# Patient Record
Sex: Female | Born: 1935 | Race: White | Hispanic: No | Smoking: Former smoker
Health system: Southern US, Community
[De-identification: ages and names within clinical notes are randomized; demographics above are authoritative.]

## PROBLEM LIST (undated history)

## (undated) DIAGNOSIS — I10 Essential (primary) hypertension: Secondary | ICD-10-CM

## (undated) DIAGNOSIS — K589 Irritable bowel syndrome without diarrhea: Secondary | ICD-10-CM

## (undated) DIAGNOSIS — G20 Parkinson's disease: Secondary | ICD-10-CM

## (undated) DIAGNOSIS — L719 Rosacea, unspecified: Secondary | ICD-10-CM

## (undated) DIAGNOSIS — R011 Cardiac murmur, unspecified: Secondary | ICD-10-CM

## (undated) DIAGNOSIS — E785 Hyperlipidemia, unspecified: Secondary | ICD-10-CM

## (undated) DIAGNOSIS — R1310 Dysphagia, unspecified: Secondary | ICD-10-CM

## (undated) DIAGNOSIS — E041 Nontoxic single thyroid nodule: Secondary | ICD-10-CM

## (undated) DIAGNOSIS — G20A1 Parkinson's disease without dyskinesia, without mention of fluctuations: Secondary | ICD-10-CM

## (undated) DIAGNOSIS — M1990 Unspecified osteoarthritis, unspecified site: Secondary | ICD-10-CM

## (undated) DIAGNOSIS — G250 Essential tremor: Secondary | ICD-10-CM

## (undated) DIAGNOSIS — R269 Unspecified abnormalities of gait and mobility: Secondary | ICD-10-CM

## (undated) DIAGNOSIS — R413 Other amnesia: Secondary | ICD-10-CM

## (undated) DIAGNOSIS — R443 Hallucinations, unspecified: Secondary | ICD-10-CM

## (undated) HISTORY — DX: Rosacea, unspecified: L71.9

## (undated) HISTORY — DX: Hyperlipidemia, unspecified: E78.5

## (undated) HISTORY — DX: Other amnesia: R41.3

## (undated) HISTORY — DX: Irritable bowel syndrome, unspecified: K58.9

## (undated) HISTORY — PX: APPENDECTOMY: SHX54

## (undated) HISTORY — PX: TONSILLECTOMY: SUR1361

## (undated) HISTORY — PX: CHOLECYSTECTOMY: SHX55

## (undated) HISTORY — DX: Essential tremor: G25.0

## (undated) HISTORY — DX: Unspecified abnormalities of gait and mobility: R26.9

---
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
## 2001-03-22 ENCOUNTER — Encounter: Payer: Self-pay | Admitting: Family Medicine

## 2001-03-22 ENCOUNTER — Ambulatory Visit (HOSPITAL_COMMUNITY): Admission: RE | Admit: 2001-03-22 | Discharge: 2001-03-22 | Payer: Self-pay | Admitting: Family Medicine

## 2001-12-10 ENCOUNTER — Encounter: Payer: Self-pay | Admitting: Family Medicine

## 2001-12-10 ENCOUNTER — Ambulatory Visit (HOSPITAL_COMMUNITY): Admission: RE | Admit: 2001-12-10 | Discharge: 2001-12-10 | Payer: Self-pay | Admitting: Family Medicine

## 2001-12-27 ENCOUNTER — Emergency Department (HOSPITAL_COMMUNITY): Admission: RE | Admit: 2001-12-27 | Discharge: 2001-12-27 | Payer: Self-pay | Admitting: Internal Medicine

## 2002-05-08 ENCOUNTER — Encounter: Payer: Self-pay | Admitting: Family Medicine

## 2002-05-08 ENCOUNTER — Ambulatory Visit (HOSPITAL_COMMUNITY): Admission: RE | Admit: 2002-05-08 | Discharge: 2002-05-08 | Payer: Self-pay | Admitting: Family Medicine

## 2002-07-02 ENCOUNTER — Ambulatory Visit (HOSPITAL_COMMUNITY): Admission: RE | Admit: 2002-07-02 | Discharge: 2002-07-02 | Payer: Self-pay | Admitting: Family Medicine

## 2002-07-02 ENCOUNTER — Encounter: Payer: Self-pay | Admitting: Family Medicine

## 2003-01-22 ENCOUNTER — Encounter: Payer: Self-pay | Admitting: General Surgery

## 2003-01-22 ENCOUNTER — Encounter: Payer: Self-pay | Admitting: Family Medicine

## 2003-01-22 ENCOUNTER — Ambulatory Visit (HOSPITAL_COMMUNITY): Admission: RE | Admit: 2003-01-22 | Discharge: 2003-01-22 | Payer: Self-pay | Admitting: Family Medicine

## 2003-01-22 ENCOUNTER — Inpatient Hospital Stay (HOSPITAL_COMMUNITY): Admission: AD | Admit: 2003-01-22 | Discharge: 2003-01-24 | Payer: Self-pay | Admitting: Family Medicine

## 2003-01-30 ENCOUNTER — Encounter: Payer: Self-pay | Admitting: Family Medicine

## 2003-01-30 ENCOUNTER — Ambulatory Visit (HOSPITAL_COMMUNITY): Admission: RE | Admit: 2003-01-30 | Discharge: 2003-01-30 | Payer: Self-pay | Admitting: Family Medicine

## 2003-05-20 ENCOUNTER — Encounter: Payer: Self-pay | Admitting: Family Medicine

## 2003-05-20 ENCOUNTER — Ambulatory Visit (HOSPITAL_COMMUNITY): Admission: RE | Admit: 2003-05-20 | Discharge: 2003-05-20 | Payer: Self-pay | Admitting: Family Medicine

## 2005-02-04 ENCOUNTER — Ambulatory Visit (HOSPITAL_COMMUNITY): Admission: RE | Admit: 2005-02-04 | Discharge: 2005-02-04 | Payer: Self-pay | Admitting: Family Medicine

## 2005-11-22 ENCOUNTER — Ambulatory Visit (HOSPITAL_COMMUNITY): Admission: RE | Admit: 2005-11-22 | Discharge: 2005-11-22 | Payer: Self-pay | Admitting: Internal Medicine

## 2005-11-22 ENCOUNTER — Ambulatory Visit: Payer: Self-pay | Admitting: Internal Medicine

## 2005-11-30 ENCOUNTER — Ambulatory Visit: Payer: Self-pay | Admitting: Internal Medicine

## 2005-11-30 ENCOUNTER — Ambulatory Visit (HOSPITAL_COMMUNITY): Admission: RE | Admit: 2005-11-30 | Discharge: 2005-11-30 | Payer: Self-pay | Admitting: Internal Medicine

## 2006-07-18 ENCOUNTER — Ambulatory Visit (HOSPITAL_COMMUNITY): Admission: RE | Admit: 2006-07-18 | Discharge: 2006-07-18 | Payer: Self-pay | Admitting: Family Medicine

## 2006-11-22 ENCOUNTER — Ambulatory Visit (HOSPITAL_COMMUNITY): Admission: RE | Admit: 2006-11-22 | Discharge: 2006-11-22 | Payer: Self-pay | Admitting: Family Medicine

## 2007-02-13 ENCOUNTER — Ambulatory Visit (HOSPITAL_COMMUNITY): Admission: RE | Admit: 2007-02-13 | Discharge: 2007-02-13 | Payer: Self-pay | Admitting: Family Medicine

## 2007-02-28 ENCOUNTER — Ambulatory Visit (HOSPITAL_COMMUNITY): Admission: RE | Admit: 2007-02-28 | Discharge: 2007-02-28 | Payer: Self-pay | Admitting: Family Medicine

## 2008-01-30 ENCOUNTER — Ambulatory Visit (HOSPITAL_COMMUNITY): Admission: RE | Admit: 2008-01-30 | Discharge: 2008-01-30 | Payer: Self-pay | Admitting: Family Medicine

## 2008-02-28 ENCOUNTER — Ambulatory Visit: Payer: Self-pay | Admitting: Cardiology

## 2008-03-03 ENCOUNTER — Ambulatory Visit (HOSPITAL_COMMUNITY): Admission: RE | Admit: 2008-03-03 | Discharge: 2008-03-03 | Payer: Self-pay | Admitting: Cardiology

## 2008-03-23 ENCOUNTER — Ambulatory Visit: Payer: Self-pay | Admitting: Cardiology

## 2008-03-25 ENCOUNTER — Ambulatory Visit: Payer: Self-pay | Admitting: Cardiology

## 2008-04-02 ENCOUNTER — Ambulatory Visit: Payer: Self-pay | Admitting: Cardiology

## 2008-04-08 ENCOUNTER — Ambulatory Visit (HOSPITAL_COMMUNITY): Admission: RE | Admit: 2008-04-08 | Discharge: 2008-04-08 | Payer: Self-pay | Admitting: Family Medicine

## 2008-04-09 ENCOUNTER — Ambulatory Visit (HOSPITAL_COMMUNITY): Admission: RE | Admit: 2008-04-09 | Discharge: 2008-04-09 | Payer: Self-pay | Admitting: Family Medicine

## 2008-04-28 ENCOUNTER — Ambulatory Visit: Payer: Self-pay | Admitting: Cardiology

## 2009-05-19 ENCOUNTER — Ambulatory Visit (HOSPITAL_COMMUNITY): Admission: RE | Admit: 2009-05-19 | Discharge: 2009-05-19 | Payer: Self-pay | Admitting: Family Medicine

## 2009-06-09 ENCOUNTER — Encounter: Payer: Self-pay | Admitting: Adult Health

## 2009-06-09 ENCOUNTER — Encounter (INDEPENDENT_AMBULATORY_CARE_PROVIDER_SITE_OTHER): Payer: Self-pay | Admitting: *Deleted

## 2009-06-09 ENCOUNTER — Ambulatory Visit: Payer: Self-pay | Admitting: Cardiology

## 2009-06-09 DIAGNOSIS — K219 Gastro-esophageal reflux disease without esophagitis: Secondary | ICD-10-CM

## 2009-06-09 DIAGNOSIS — I10 Essential (primary) hypertension: Secondary | ICD-10-CM

## 2009-06-09 DIAGNOSIS — R55 Syncope and collapse: Secondary | ICD-10-CM

## 2009-06-09 DIAGNOSIS — R259 Unspecified abnormal involuntary movements: Secondary | ICD-10-CM | POA: Insufficient documentation

## 2009-06-09 DIAGNOSIS — J45909 Unspecified asthma, uncomplicated: Secondary | ICD-10-CM

## 2009-06-09 LAB — CONVERTED CEMR LAB
ALT: 18 units/L
AST: 14 units/L
Alkaline Phosphatase: 82 units/L
Bilirubin, Direct: 0.2 mg/dL
Cholesterol: 180 mg/dL
HDL: 60 mg/dL
LDL Cholesterol: 86 mg/dL
Potassium: 4.4 meq/L
Sodium: 142 meq/L
Total Protein: 6.6 g/dL
Triglycerides: 168 mg/dL

## 2010-05-25 ENCOUNTER — Ambulatory Visit (HOSPITAL_COMMUNITY): Admission: RE | Admit: 2010-05-25 | Discharge: 2010-05-25 | Payer: Self-pay | Admitting: Family Medicine

## 2010-06-09 ENCOUNTER — Ambulatory Visit (HOSPITAL_COMMUNITY): Admission: RE | Admit: 2010-06-09 | Discharge: 2010-06-09 | Payer: Self-pay | Admitting: Family Medicine

## 2010-06-16 ENCOUNTER — Encounter: Admission: RE | Admit: 2010-06-16 | Discharge: 2010-06-16 | Payer: Self-pay | Admitting: Family Medicine

## 2010-07-06 ENCOUNTER — Ambulatory Visit (HOSPITAL_COMMUNITY): Admission: RE | Admit: 2010-07-06 | Discharge: 2010-07-06 | Payer: Self-pay | Admitting: Family Medicine

## 2010-07-13 ENCOUNTER — Ambulatory Visit (HOSPITAL_COMMUNITY)
Admission: RE | Admit: 2010-07-13 | Discharge: 2010-07-13 | Payer: Self-pay | Source: Home / Self Care | Admitting: Family Medicine

## 2010-08-29 ENCOUNTER — Encounter: Payer: Self-pay | Admitting: Family Medicine

## 2010-12-21 NOTE — Letter (Signed)
April 02, 2008    Lilyan Punt, MD  [address]  [place], [place]  [address]   RE:  Heather Gilmore, Heather Gilmore  MRN:  [id number]  /  DOB:  Mar 28, 1930   Dear Lorin Picket:   Ms. Fromer returns to the office for continued assessment and treatment of  lightheadedness and 1 syncopal spell.  She has felt better since her  last office visit.  She attributes this to 1 week when she omitted her  enalapril while she was traveling, because she forgot to take it with  her.  She did carry and event recorder for 3 weeks.  She is on to  multiple strips describing her symptoms as dyspnea and palpitations.  She did have reasonable correlation with her rhythm, which was typically  sinus bradycardia or junctional bradycardia; however, the relationship  was not perfect.  At times, she had very slow heart rate without  symptoms.  At other times, she reported symptoms without much in the way  of arrhythmia.   IMPRESSION:  Ms. Streng is doing fairly well over all.  It appears that  her symptoms may be related to relative bradycardia associated with a  relatively low blood pressure.  This would account for improvement with  discontinuation of enalapril which did not affect her heart rhythm.  We  will try to omit verapamil and continue enalapril.  She will monitor  blood pressures at home.  She will call to report recurrent syncope if  that occurs.  Otherwise, I will re-assess this nice woman in 1 month and  determine whether or not implantation of a pacemaker is warranted.   Her laboratory studies were generally benign except for the carotid  ultrasound, which reveled no carotid disease but did identify a cystic  structure in the thyroid.  As per my previous note, I would appreciate  your managing that issue.    Sincerely,      [name], MD, [facility]  Electronically Signed    RMR/MedQ  DD: 04/02/2008  DT: 04/03/2008  Job #: [id number]

## 2010-12-21 NOTE — Letter (Signed)
April 28, 2008    Scott A. Gerda Diss, MD  [address]  [place], [place] [address]   RE:  SHANTRELL, PLACZEK  MRN:  [id number]  /  DOB:  30-Jun-1930   Dear Lorin Picket:   Ms. Lun returns to the office for continued assessment treatment of  lightheadedness with one episode of syncope and substantial sinus  bradycardia.  Since my last adjustment of medication, she has felt just  fine.  She notes no further neurologic symptoms.  She has a good level  of energy, and denies all cardiopulmonary symptoms.  She has had further  evaluation of her thyroid.  A scan showed multiple cysts, some of them  complex.  A biopsy is apparently planned.   CURRENT MEDICATIONS:  1. Enalapril 10 mg daily.  2. Topiramate 50 mg b.i.d.  3. Aspirin 81 mg daily.   PHYSICAL EXAMINATION:  GENERAL:  On exam, pleasant overweight woman in  no acute distress.  VITAL SIGNS:  The weight is 142, 1 pound less than last month.  Blood  pressure 150/80, heart rate 80 and regular, and respirations 14.  NECK:  No jugular venous distention; normal carotid upstrokes without  bruits.  LUNGS:  Clear.  CARDIAC:  Normal first and second heart sounds; fourth heart sound  present.  ABDOMEN:  Soft and nontender; no organomegaly.  EXTREMITIES:  No edema; distal pulses are intact.   Rhythm strip:  Normal sinus rhythm at a rate of 77.   Ms. Klinker brings in a list of 14 blood pressures obtained at home.  Only  one shows significantly elevated systolics of 160.  There are 2-3  systolics close to 150.  The rest are fine.   IMPRESSION:  Ms. Frick symptoms have resolved with discontinuation of  verapamil.  It appears that bradycardia was more problematic than  hypotension.  Sinus bradycardia is not frequently seen with verapamil,  but can sometimes occur.  It would be worthwhile for Ms. Kushnir to avoid  any weight-lowering drugs, most notably diltiazem and beta-blockers.  I  will substitute lisinopril at a dose of 20 mg for enalapril in  hopes of  better controlling her blood pressure.  She does not appear to need  aspirin, she has no known vascular disease.  I will see this nice woman  again in 1 year.    Sincerely,      [name], MD, [facility]  Electronically Signed    RMR/MedQ  DD: 04/28/2008  DT: 04/29/2008  Job #: [id number]

## 2010-12-21 NOTE — Letter (Signed)
February 28, 2008    Scott A. Gerda Diss, MD  [address], [address]  [place], [place] [address]   RE:  Heather, Gilmore  MRN:  [id number]  /  DOB:  23-Mar-1930   Dear Lorin Picket,   Heather Gilmore is seen in the office today in consultation at your request for  syncope.  As you know, Heather Gilmore has enjoyed generally good  health.  She has never been seen by a cardiologist nor undergone any  significant cardiac testing.  She has had hypertension, which has been  well controlled with medical therapy.  She does not used tobacco  products.  She has not had diabetes.  She has no history of vascular  disorders other than DVT.   Over the past 4 weeks or so, she is noted episodic dizziness.  Heather  typically occurs when she arises from the sitting or lying position, but  is not related to change in body position at other times.  Heather Gilmore most  serious episode was upon walking into the bathroom when she suddenly  lost consciousness and fell.  She had a soft tissue injury to Heather Gilmore left  knee, but Heather healed without problems.  She has had subsequent dizzy  spells that have resolved with sitting.  She notes associated  palpitations and dyspnea at times.  She has had a MRI study of Heather Gilmore head  that was negative, showing only small vessel disease.  An MRA was not  performed in conjunction with that study.  She has taken Heather Gilmore blood  pressure after the onset of spells, but has not identified true  hypotension.  Episodes are quite intermittent.  She may go a period of  weeks without symptoms, but then suffer daily symptoms for a period of  time.   Past medical history is mostly notable for GI issues.  She has GERD and  has undergone a number of esophageal dilatations.  She also has a  history of IBS.  She is said to have asthma, but did not require any  significant medication.  She has a longstanding tremor that is treated  by Dr. Thad Ranger of [facility].  She is unaware of his diagnosis.   She had a significant  pneumonia in 2002.  She underwent appendectomy  many years ago.   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  Walks on a daily basis.   FAMILY HISTORY:  Positive for neoplastic disease, but not vascular  disease.   Review of systems is notable for intermittent headaches, she need for  corrective lenses, some impairment in hearing, intermittent dyspnea,  palpitations, Heather Gilmore last colonoscopy was in 2007.  She has some  intermittent pain in Heather Gilmore left leg and foot with edema.  She has rosacea.  All other systems reviewed and are negative.   On exam,  GENERAL:  Heather Gilmore with erythematous complexion over the face in  no acute distress.  VITAL SIGNS:  The weight is 144.  Blood pressure 150/80 without  orthostatic change.  Nonetheless, the patient reported dizziness when  she stood up.  HEENT:  EOMs full; normal lids and conjunctivae; normal oral mucosa.  NECK:  No jugular venous distention; normal carotid upstrokes without  bruits.  ENDOCRINE:  No thyromegaly.  HEMATOPOIETIC:  No adenopathy.  LUNGS:  Clear.  CARDIAC:  Normal first and second heart sounds; fourth heart sound  present.  ABDOMEN:  Soft and nontender; no organomegaly.  EXTREMITIES:  Trace edema; normal  distal pulses.  NEUROLOGIC:  Symmetric strength in tone; normal reflexes; normal cranial  nerves.   EKG:  Normal sinus rhythm; borderline first-degree AV block; voltage  criteria for LVH; minor nonspecific ST-T wave abnormality; abnormal R-  wave progression - cannot exclude prior septal myocardial infarction.  No prior tracing for comparison.   IMPRESSION:  [name] has some symptoms of sound like vertigo, but  transient loss of consciousness would not be expected with a fairly  vertiginous symptoms.  The orthostatic nature of Heather Gilmore problems raises the  question of orthostatic hypotension; however, she manifests only a  minimal decrease in blood pressure when standing today.  She  could have  a tendency towards orthostatic hypotension with episodic dehydration,  but I think Heather is unlikely.  Arrhythmia is another possible cause of  cerebral hypoperfusion.  We will provide Heather Gilmore with event recorder.  Basic  laboratory testing will be obtained.  There is no history to suggest a  serious or  life-threatening cause of syncope such as pulmonary embolism.  The kind  of symptoms that she manifested in the office may reflect more in the  way of vertigo than cerebral hypoperfusion.  I am not inclined to  investigate that possibility until we have excluded significant causes  of syncope.  I will reassess Heather Gilmore in 1 month.    Sincerely,      [name], MD, [facility]  Electronically Signed    RMR/MedQ  DD: 02/28/2008  DT: 02/29/2008  Job #: [id number]

## 2010-12-24 NOTE — Procedures (Signed)
   NAME:  MEKIA, DIPINTO NO.:  [id number]   MEDICAL RECORD NO.:  [id number]                   PATIENT TYPE:  OUT   LOCATION:  RAD                                  FACILITY:  [facility]   PHYSICIAN:  Donna Bernard, M.D.             DATE OF BIRTH:  [birth year]   DATE OF PROCEDURE:  01/22/2003  DATE OF DISCHARGE:  01/22/2003                                EKG INTERPRETATION   INTERPRETATION:  EKG reveals normal sinus rhythm with no significant ST-T  changes.  PR interval is 0.22 so therefore first degree A-V block.      ___________________________________________                                            Donna Bernard, M.D.   WSL/MEDQ  D:  06/10/2003  T:  06/10/2003  Job:  [id number]

## 2010-12-24 NOTE — Op Note (Signed)
NAME:  Heather Gilmore, Heather Gilmore                 ACCOUNT NO.:  [id number]   MEDICAL RECORD NO.:  [id number]          PATIENT TYPE:  AMB   LOCATION:  DAY                           FACILITY:  [facility]   PHYSICIAN:  R. Roetta Sessions, M.D. DATE OF BIRTH:  1930-07-24   DATE OF PROCEDURE:  11/30/2005  DATE OF DISCHARGE:                                 OPERATIVE REPORT   PROCEDURES:  Esophagogastroduodenoscopy with Elease Hashimoto dilation, followed by  diagnostic colonoscopy.   INDICATION FOR PROCEDURE:  The patient is a 77 year old lady with a  Hemoccult-positive stool and chronic constipation.  Had a distant history of  adenomatous polyps.  One out of three Hemoccult cards came back positive  recently.  She has not had any gross blood per rectum.  She had some severe  left-sided crampy abdominal pain the day before she saw [place] in the office  (November 21, 2005).  She had then a CT scan, which revealed diverticulosis, no  diverticulitis, and a fatty-appearing liver.  She also had esophageal  dysphagia to solids, has had the symptoms for years.  EGD and colonoscopy  are now being done.  This approach has been discussed with the patient at  length, the potential risks, benefits, and alternatives have been reviewed,  all questions answered.  She is agreeable.  There has not been any change  since she was seen in the office on November 22, 2005.   PROCEDURE NOTE:  O2 saturation, blood pressure, pulse, and respiration were  monitored throughout the entire procedure.   CONSCIOUS SEDATION:  Versed 5 mg IV, Demerol 125 mg IV in divided doses for  both procedures.   The patient received 1 g of vancomycin IV and gentamicin 100 mg IV for SBE  prophylaxis prior to the procedure.  She had some itching after vancomycin  infusion was started, which responded to a single dose of Benadryl 25 mg IV.   INSTRUMENT USED:  Olympus video chip system.   FINDINGS:  EGD:  Examination of the tubular esophagus revealed a prominent  Schatzki's ring.  The esophageal mucosa otherwise appeared normal.  The EG  junction was easily traversed with the scope.   Stomach:  The gastric cavity was empty and insufflated well with air.  A  thorough examination of the gastric mucosa including retroflexion in the  proximal stomach and esophagogastric junction demonstrated only a small  hiatal hernia.  Pylorus patent and easily traversed.  Examination of the  bulb and second portion revealed no abnormalities.   Therapeutic/diagnostic maneuvers performed:  A 54 French Maloney dilator was  passed to full insertion.  A look back revealed that the ring had been  ruptured without apparent complication.  The patient tolerated the procedure  well and was prepared for colonoscopy.   Digital rectal exam revealed lax anal sphincter tone.   Endoscopic findings:  The prep was adequate.   Rectum:  Examination of the rectal mucosa including retroflexion in the anal  verge and an en face view of the anal canal demonstrated some friable  internal hemorrhoids only.   Colon:  The colonic  mucosa was surveyed from the rectosigmoid junction  through the left, transverse and right colon, to the area of the appendiceal  orifice, ileocecal valve and cecum.  These structures were well-seen and  photographed for the record.  From this level the scope was slowly withdrawn  and all previously-mentioned mucosal surfaces were again seen.  The patient  had extensive transverse and left-sided diverticula.  The remainder of the  colonic mucosa appeared normal.  The patient tolerated both procedures well  and was reacted in endoscopy.   IMPRESSION:  Esophagogastroduodenoscopy:  1.  Prominent Schatzki's ring, otherwise normal esophagus.  2.  Small hiatal hernia, otherwise normal stomach, D1, D2.  3.  Status post dilation as described above.   Colonoscopy findings:  1.  Friable hemorrhoids, otherwise normal rectum.  2.  Extensive left-sided and transverse  diverticula.  The remainder of the      colonic mucosa appeared normal.   RECOMMENDATIONS:  1.  Diverticulosis literature given to Heather Gilmore.  She should take a daily      fiber supplement daily.  2.  She should also continue taking Prilosec 20 mg orally on an every-day      basis.  3.  She should have another surveillance colonoscopy in five years.  Would      not pursue further GI evaluation unless this lady starts having signs      and symptoms of overt GI bleeding.      Jonathon Bellows, M.D.  Electronically Signed     RMR/MEDQ  D:  11/30/2005  T:  12/01/2005  Job:  [id number]   cc:   Donna Bernard, M.D.  Fax: [phone]

## 2010-12-24 NOTE — Procedures (Signed)
NAME:  ELOWEN, DEBRUYN NO.:  [id number]   MEDICAL RECORD NO.:  [id number]                  PATIENT TYPE:   LOCATION:                                       FACILITY:   PHYSICIAN:  Donna Bernard, M.D.             DATE OF BIRTH:   DATE OF PROCEDURE:  01/22/2003  DATE OF DISCHARGE:                                EKG INTERPRETATION   FINDINGS:  1. EKG reveals normal sinus rhythm with nonspecific ST-T changes.  2. The PR interval is 0.22 which is consistent with first-degree AV block.      ___________________________________________                                            Donna Bernard, M.D.   WSL/MEDQ  D:  08/06/2003  T:  08/06/2003  Job:  [id number]

## 2010-12-24 NOTE — Op Note (Signed)
NAME:  Heather Gilmore, Heather Gilmore NO.:  [id number]   MEDICAL RECORD NO.:  [id number]                   PATIENT TYPE:  INP   LOCATION:  A312                                 FACILITY:  [facility]   PHYSICIAN:  Dalia Heading, M.D.               DATE OF BIRTH:  [birth year]   DATE OF PROCEDURE:  DATE OF DISCHARGE:                                 OPERATIVE REPORT   PREOPERATIVE DIAGNOSIS:  Acute cholecystitis, cholelithiasis.   POSTOPERATIVE DIAGNOSIS:  Acute cholecystitis, cholelithiasis.   PROCEDURE:  Laparoscopic cholecystectomy   SURGEON:  Dalia Heading, M.D.   ASSISTANT:  Buena Irish, M.D.   ANESTHESIA:  General endotracheal   INDICATIONS:  The patient is a 77 year old white female who presents with  acute cholecystitis secondary to cholelithiasis.  The risks and benefits of  the procedure including bleeding, infection, hepatobiliary injury, and the  possibility of an open procedure were fully explained to the patient, who  gave informed consent.   DESCRIPTION OF PROCEDURE:  The patient was placed in the supine position.  After induction of general endotracheal anesthesia, the abdomen was prepped  and draped using the usual sterile technique with Betadine.  Surgical site  confirmation was performed.   An supraumbilical incision was made down to the fascia.  A Veress needle was  introduced into the abdominal cavity and confirmation of placement was done  using the saline drop test.  The abdomen was then insufflated to 16 mmHg  pressure.  An 11-mm trocar was introduced into the abdominal cavity under  direct visualization without difficulty.  The patient was placed then in  reverse Trendelenburg position and an additional 11-mm trocar was placed in  the epigastric region and 5-mm trocars were placed in the right upper  quadrant and right flank regions. The liver was inspected and noted to be  within normal limits.   The gallbladder was noted to be  acutely inflamed and distended.  A needle  was advanced into the gallbladder lumen and hydrops of the gallbladder was  found.  The gallbladder was then decompressed.  A stone was noted in the  neck of the infundibulum.  The gallbladder was retracted superiorly and  laterally.  The dissection was begun around the infundibulum of the  gallbladder.  The cystic duct was first identified.  Its junction to the  infundibulum fully identified.  Given the short length of the cystic duct, a  cholangiocatheter could not be performed.  An Endo-GIA was placed across the  cystic duct and fired.  The cystic artery was ligated and divided.  The  gallbladder was then freed away from the gallbladder fossa using Bovie  electrocautery.  The gallbladder was delivered through the epigastric trocar  site using an EndoCatch bag.  The gallbladder fossa was inspected and any  bleeding was controlled using Bovie electrocautery.  Surgicel was then  placed in  the gallbladder fossa.  The subhepatic space, as well as the right  hepatic gutter were irrigated with normal saline.  All fluid and air were  then evacuated from the abdominal cavity prior to removal of the trocars.   All wounds were irrigated with normal saline.  All wounds were injected with  0.5% Sensorcaine.  The supraumbilical fascia as well as epigastric fascia  were reapproximated using an #0 Vicryl interrupted suture. All skin  incisions were closed using staples.  Betadine ointment and dry sterile  dressings were applied.   All tape and needle counts correct at the end of the procedure.  The patient  was extubated in the operating room and went back to recovery room in awake  and stable condition.   COMPLICATIONS:  None.   SPECIMENS:  Gallbladder with stones.   BLOOD LOSS:  50 cc                                               Dalia Heading, M.D.    MAJ/MEDQ  D:  01/23/2003  T:  01/23/2003  Job:  [id number]   cc:   Donna Bernard, M.D.  [address]  [place]  [place] [address]  Fax: [phone]

## 2010-12-24 NOTE — Letter (Signed)
March 06, 2008    Scott A. Gerda Diss, MD  [address]  [place], [place] [address]   RE:  CYNDEL, GRIFFEY  MRN:  [id number]  /  DOB:  1930-05-08   Dear Lorin Picket,   In conjunction with Ms. Suniga cardiac evaluation, an ultrasound study  of her carotids was obtained.  No significant atherosclerotic disease  was present; however, a complex cystic structure in the left thyroid was  noted.  The radiologist suggested additional evaluation.  If you would  be so kind, I would appreciate you pursuing this incidental finding.   Thank you so much for your help.    Sincerely,      [name], MD, [facility]  Electronically Signed    RMR/MedQ  DD: 03/06/2008  DT: 03/06/2008  Job #: [id number]

## 2010-12-24 NOTE — Discharge Summary (Signed)
   NAME:  Heather Gilmore, BUCKER NO.:  [id number]   MEDICAL RECORD NO.:  [id number]                   PATIENT TYPE:  INP   LOCATION:  A312                                 FACILITY:  [facility]   PHYSICIAN:  Dalia Heading, M.D.               DATE OF BIRTH:  March 18, 1930   DATE OF ADMISSION:  01/22/2003  DATE OF DISCHARGE:  01/24/2003                                 DISCHARGE SUMMARY   HOSPITAL COURSE SUMMARY:  The patient is a 77 year old white female who  presented to Dr. Fletcher Anon office with right upper quadrant abdominal pain  and nausea.  An ultrasound of the gallbladder was performed which revealed  cholelithiasis, a thickened gallbladder wall, and a slightly dilated common  bile duct.  No choledocholithiasis was seen.  The patient was admitted to  the hospital.  A surgery consultation was obtained and the patient was taken  to the operating room on January 23, 2003 and underwent a laparoscopic  cholecystectomy.  Due to significant inflammation of the gallbladder, a  cholangiogram could not be performed.  Her liver enzyme tests were within  normal limits preoperatively.  The patient's postoperative course was  unremarkable.  Her diet was advanced without difficulty.   The following day her hematocrit and liver enzymes tests were within normal  limits.  The patient is being discharged home on postoperative day #1 in  good and improving condition.   DISCHARGE INSTRUCTIONS:  The patient is to follow up with Dr. Franky Macho  on January 30, 2003.   DISCHARGE MEDICATIONS:  1. Darvocet-N 100 one to two tablets p.o. q.4h. p.r.n. pain.  2. Ciprofloxacin 500 mg p.o. b.i.d.  3. She is to resume her other medications as previously prescribed.   PRINCIPAL DIAGNOSES:  1. Acute cholecystitis, cholelithiasis.  2. Hypertension.  3. History of deep vein thrombosis.  4.     Osteoporosis.  5. Asthma.   PRINCIPAL PROCEDURE:  Laparoscopic cholecystectomy on January 23, 2003.                                               Dalia Heading, M.D.    MAJ/MEDQ  D:  01/24/2003  T:  01/24/2003  Job:  [id number]   cc:   Donna Bernard, M.D.  [address]  [place]  [place] [address]  Fax: [phone]

## 2010-12-24 NOTE — H&P (Signed)
NAME:  Heather Gilmore, Heather Gilmore NO.:  [id number]   MEDICAL RECORD NO.:  [id number]                   PATIENT TYPE:  INP   LOCATION:  A312                                 FACILITY:  [facility]   PHYSICIAN:  Donna Bernard, M.D.             DATE OF BIRTH:  1929/09/01   DATE OF ADMISSION:  01/22/2003  DATE OF DISCHARGE:                                HISTORY & PHYSICAL   CHIEF COMPLAINT:  Abdominal pain.   Patient present to the office with a two day history of abdominal pain.  Last night she ate some greasy food and soon after that started developing  right upper quadrant pain throughout the night this radiated to the back.  It was accompanied by significant nausea.  The patient has noted perhaps low-  grade fever.  At times the pain has been quite severe.  Patient noted no  change in her bowel habits, no change in urinary habits.  Her appetite has  been pretty much absent today.   MEDICATIONS:  1. Patient claims compliance with her medications which include aspirin 81     mg daily.  2. Cozaar 50 mg daily.  3. Verapamil 180 SR daily.  4. Calcium/vitamin D, two tablets daily.  5. Ventolin two sprays q.4h. p.r.n.  6. Nexium 40 mg daily.   PRIOR MEDICAL HISTORY:  1. Significant for hyperlipidemia.  2. Hypertension.  3. Esophagitis.  4. Osteoporosis.  5. Asthma.  6. History of deep vein thrombosis.   PRIOR SURGERIES:  1. Remote appendectomy.  2. Remote esophageal dilatation.   FAMILY HISTORY:  Positive for breast cancer, colon cancer, hypertension,  type 2 diabetes, coronary artery disease, hyperlipidemia.   ALLERGIES:  1. AMPICILLIN.  2. DYAZIDE.  3. Patient does have an ASPIRIN sensitivity history but not a true allergy.   REVIEW OF SYSTEMS:  Otherwise negative.   PHYSICAL EXAMINATION:  VITAL SIGNS:  BP 134/82, afebrile, weight 152.  GENERAL:  Patient is alert, in mild discomfort.  HEENT:  Normal.  Pharynx moist.  NECK:  Supple.  No  lymphadenopathy.  LUNGS:  Rare wheeze in the left base.  No crackles.  No tachypnea.  HEART:  Regular, rate and rhythm.  ABDOMEN:  Distinct right upper quadrant tenderness.  No rebound.  Mild  guarding.  No CVA tenderness.  EXTREMITIES:  No edema.  Good range of motion.   SIGNIFICANT BLOOD WORK:  White blood count up at 11,000.  Liver enzymes  normal.   Right upper quadrant ultrasound reveals inflamed gallbladder wall with  multiple stones both in the gallbladder and extending down into near the  duct with an 8 mm common bile duct.   IMPRESSION:  1. Acute cholecystitis.  Discussed at length with the patient and her     family, I think that she needs to have her gallbladder out and that this     is not an  operation without some risk.  The family is advised of such.  2. History of deep vein thrombosis.  With this prior history would like to     be aggressive and administer Lovenox 40 mg subcu q.24h. with the first     dose two hours prior to surgery as per protocol.  3. Asthma.  Stable at this time but we will treat prophylactically to reduce     risk of perioperative reactive airway.  4. Hypertension good control.  5. Hyperlipidemia.   PLAN:  I have spoken with Dr. Lovell Sheehan.  He will see her in consultation this  evening.  I spoke with the family, evaluated the patient.  Further orders as  noted in the chart.                                                Donna Bernard, M.D.    WSL/MEDQ  D:  01/22/2003  T:  01/22/2003  Job:  [id number]

## 2010-12-24 NOTE — Consult Note (Signed)
NAME:  Heather Gilmore, Heather Gilmore                 ACCOUNT NO.:  [id number]   MEDICAL RECORD NO.:  [id number]         PATIENT TYPE:  AMB   LOCATION:                                FACILITY:  [facility]   PHYSICIAN:  R. Roetta Sessions, M.D. DATE OF BIRTH:  [birth year]   DATE OF CONSULTATION:  DATE OF DISCHARGE:                                   CONSULTATION   PRIMARY CARE PHYSICIAN:  Donna Bernard, M.D.   REASON FOR CONSULTATION:  Severe abdominal pain, nausea, vomiting, Hemoccult  positive stool.   HISTORY OF PRESENT ILLNESS:  Heather Gilmore is a 77 year old Caucasian female who  through annual Hemoccult cards were found to have 1/3 Hemoccult positive  stool via recent physical exam.  She has a personal history of adenomatous  polyp back in 1998.  Her last colonoscopy was Dec 27, 2001 by Dr. Jena Gauss, and  she was found to have left-sided transverse diverticula.  She reports that  three days ago she took two Dulcolax, as she has a history of chronic  constipation.  She began to experience severe low abdominal pain.  The  following morning, she developed nausea and vomiting with at least three  episodes.  She continues to have cramp-like bilateral lower quadrant pain,  left side greater than right.  She denies any fevers or chills.  She has a  history of chronic constipation with a bowel movement every 3-4 days.  She  complains of hard, pellet-like stools.  She reports the pain in her low  abdominal pain as an 8/10, currently on a pain scale.   She has a history of chronic GERD, heartburn, indigestion.  She takes  Prilosec on an almost daily basis.  Occasionally when she forgets, she will  take over-the-counter Tums at bedtime.  This does help some.  She is  complaining of recurrent intermittent solid food dysphagia.  She feels like  food gets hung in her mid esophagus.  Occasionally, she has regurgitation as  well.  Denies any odynophagia.  Denies any early satiety or anorexia.   PAST MEDICAL HISTORY:   History of adenomatous polyp and diverticulosis, as  described in the HPI with last colonoscopy in May, 2003.  She has a history  of EGD on September 02, 1999 by Dr. Karilyn Cota, where she was found to have a  tight distal esophageal ring, which was initially dilated, passing the  scope, and subsequently with a gradual balloon up to 18 mm, showed a small  sliding hiatal hernia.  Showed mild changes of reflux esophagitis, limited  to the EG junction.  She has a history of hypertension and rheumatic fever  as a child.  Chronic constipation.  Asthma.  Hypercholesterolemia.  Left  lower extremity DVT.  Appendectomy.  Cholecystectomy 3-4 years ago by Dr.  Lovell Sheehan secondary to cholelithiasis.  Tonsillectomy.   CURRENT MEDICATIONS:  1.  Verapamil 180 mg daily.  2.  Aspirin 81 mg daily.  3.  Calcium and vitamin D 1 tablet daily.  4.  Vasotec 10 mg daily.  5.  Prilosec 20 mg daily.  6.  Albuterol inhaler p.r.n.  7.  Tums p.r.n.   ALLERGIES:  PENICILLIN.   FAMILY HISTORY:  Positive for a mother deceased with nonalcoholic cirrhosis  at age 83.  Father deceased at age 61 secondary to lung carcinoma.   SOCIAL HISTORY:  Heather Gilmore has been married for 39 years.  She has two grown  healthy children.  She is a retired Publishing rights manager.  She has a 20-pack-year  history of tobacco use.  Denies any alcohol or drug use.   REVIEW OF SYSTEMS:  CONSTITUTIONAL:  Weight is stable.  Denies any fevers or  chills.  See HPI.  CARDIOVASCULAR:  Denies any chest pain or palpitations.  PULMONARY:  Denies any shortness of breath, dyspnea, cough, hemoptysis.  See  HPI.  GU:  Denies any dysuria, hematuria, increased urinary frequency.   PHYSICAL EXAMINATION:  VITAL SIGNS:  Weight 154.5 pounds.  Height 61 inches.  Temp 98.1, blood pressure 122/60, pulse 78.  GENERAL:  Heather Gilmore is a 77 year old Caucasian female who is oriented,  pleasant, cooperative.  No acute distress.  HEENT:  Sclerae are clear.  Nonicteric.  Conjunctivae are  pink.  Oropharynx  is pink and moist without any lesions.  NECK:  Supple without lymphadenopathy or thyromegaly.  HEART:  Regular rate with a 1/6 murmur noted.  LUNGS:  Clear to auscultation bilaterally.  ABDOMEN:  Positive bowel sounds x4.  No bruits auscultated.  Soft.  Slightly  distended. She does have significant tenderness to the right lower quadrant  suprapubic area and severe tenderness to the left lower quadrant on deep  palpation.  There is no rebound tenderness or guarding.  No  hepatosplenomegaly or mass.  The exam is limited, given the patient's  tenderness.  EXTREMITIES:  Without clubbing or edema bilaterally.  SKIN:  Pink, warm and dry without any rash or jaundice.   IMPRESSION:  Heather Gilmore is a 77 year old Caucasian female with a history of  chronic constipation and history of adenomatous polyps, who was found to  have a 1/3 positive Hemoccult through recent testing through her primary  care physician's office.  Approximately three days ago, she developed severe  cramp-like abdominal pain, left lower quadrant greater than right, along  with nausea and vomiting.  The pain developed after taking Dulcolax.  The  pain has persisted.  She is quite tender on exam today.  She has a history  of diverticulosis.  Feel certain we will rule out diverticulosis in this  setting.  After this is done, we will consider a repeat colonoscopy, given  her history of adenomatous polyps and Hemoccult positive stool.  We will be  discussing this case further with Dr. Jena Gauss.   She also has recurrent intermittent solid food dysphagia with history of  chronic GERD and distal esophageal ring.  I suspect she may have recurrence  of her ring or developed web or stricture and may need repeat dilatation.   PLAN:  1.  CT of the abdomen and pelvis __________ as soon as possible.  2.  I have offered her pain medication.  She has declined. 3.  I have asked her to take once daily Prilosec 20 mg.  4.   Pending review of CT, we will consider colonoscopy, EGD with ED.  After      discussing with Dr. Jena Gauss, she is going to need SBE prophylaxis and to      be off her aspirin for at least three days prior to the procedure.   We would  like to thank Dr. Lubertha South for allowing [place] to participate in  the care of Heather Gilmore.      Nicholas Lose, N.P.      Jonathon Bellows, M.D.  Electronically Signed    KC/MEDQ  D:  11/22/2005  T:  11/22/2005  Job:  [id number]   cc:   Donna Bernard, M.D.  Fax: [phone]

## 2010-12-24 NOTE — Op Note (Signed)
[facility]  Patient:    Heather Gilmore, Heather Gilmore Visit Number: [id number] MRN: [id number]          Service Type: EMS Location: ED Attending Physician:  Jonathon Bellows Dictated by:   Roetta Sessions, M.D. Proc. Date: 12/27/01 Admit Date:  12/27/2001 Discharge Date: 12/27/2001   CC:         Loran Senters, M.D.   Operative Report  PROCEDURE:  Surveillance colonoscopy.  INDICATIONS FOR PROCEDURE:  The patient is a 77 year old lady who was found to have an adenomatous polyp in her colon which I removed back in 1998. She is here for surveillance. She is devoid of any lower GI tract symptoms. A colonoscopy has been discussed with this patient at length at the bedside. The potential risks, benefits, and alternatives have been reviewed.  It is notable that she gave our staff a history of rheumatic fever as a child and since last being seen by me, she reports it was determined by other health care provides that she should take SB prophylaxis.  It is notable she did not receive SB prophylaxis for her prior colonoscopy nor did she tell us about her history of rheumatic fever.  In the day hospital, she was beginning to receive her 2 gm of ampicillin as part of her SB prophylaxis. She developed some flushing of her face, some chest pain and some right sided facial edema. The infusion was stopped, she was given 25 mg of Benadryl. She then complained of chest pain for which she went to the emergency room and was seen by Dr. Paul Dykes. By the time she was evaluated by Dr. Paul Dykes, the chest pain had resolved. She was given 125 mg of Solu-Medrol IV in the emergency room. Cardiac enzymes were negative. EKG demonstrated normal sinus rhythm. I went down to the emergency room and saw Ms. Thieme where she did have some objective right facial swelling but otherwise was doing fine. We elected to proceed with colonoscopy if Ms. Oswald did not want to get the dose of  gentamycin.  MONITORING:  O2 saturations, blood pressure, pulse and respirations were monitored throughout the entire procedure.  CONSCIOUS SEDATION:  Versed 4 mg IV, Demerol 75 IV in divided doses.  INSTRUMENT:  Olympus video chip colonoscope.  FINDINGS:  Digital rectal exam revealed no abnormalities.  ENDOSCOPIC FINDINGS:  The prep was adequate.  RECTUM:  Examination of the rectal mucosal including a retroflexed view of the anal verge revealed no abnormalities.  COLON:  The colonic mucosa was surveyed from the rectosigmoid junction through the left transverse right colon to the area of the appendiceal orifice, ileocecal valve, and cecum. These structures were well seen and photographed for the record. The patient was noted to have left sided transverse diverticula. The remainder of the colonic mucosa appeared normal. From the level of the cecum and ileocecal valve (see photos), the scope was slowly withdrawn. All previously mentioned mucosal surfaces were again seen and again no other abnormalities were observed. The patient tolerated the procedure well and was reacted in endoscopy.  IMPRESSION:  1. Normal rectum.  2. Left sided transverse diverticulum. The remainder of the colonic mucosa     appeared normal.  RECOMMENDATIONS:  1. Diverticulosis literature given to Ms. Brooke Dare.  2. Repeat colonoscopy in 5 years. Dictated by:   Roetta Sessions, M.D. Attending Physician:  Jonathon Bellows DD:  12/27/01 TD:  12/30/01 Job: [id number]

## 2010-12-24 NOTE — Procedures (Signed)
[facility]  Patient:    Heather Gilmore, Heather Gilmore Visit Number: [id number] MRN: [id number]          Service Type: Attending:  Kari Baars, M.D. Dictated by:   Kari Baars, M.D.                            EKG Interpretations  The rhythm is sinus rhythm with rate in the 60s.  Otherwise normal electrocardiogram.  ______ Dictated by:   Kari Baars, M.D. Attending:  Kari Baars, M.D. DD:  02/26/02 TD:  03/01/02 Job: [id number]

## 2011-04-25 ENCOUNTER — Emergency Department (HOSPITAL_COMMUNITY)
Admission: EM | Admit: 2011-04-25 | Discharge: 2011-04-25 | Disposition: A | Discharge status: Home / Self Care | Payer: Medicare HMO | Attending: Emergency Medicine | Admitting: Emergency Medicine

## 2011-04-25 ENCOUNTER — Emergency Department (HOSPITAL_COMMUNITY): Payer: Medicare HMO

## 2011-04-25 ENCOUNTER — Other Ambulatory Visit: Payer: Self-pay

## 2011-04-25 ENCOUNTER — Encounter: Payer: Self-pay | Admitting: *Deleted

## 2011-04-25 DIAGNOSIS — R1013 Epigastric pain: Secondary | ICD-10-CM | POA: Insufficient documentation

## 2011-04-25 DIAGNOSIS — G20 Parkinson's disease: Secondary | ICD-10-CM | POA: Insufficient documentation

## 2011-04-25 DIAGNOSIS — R0789 Other chest pain: Secondary | ICD-10-CM | POA: Insufficient documentation

## 2011-04-25 DIAGNOSIS — I10 Essential (primary) hypertension: Secondary | ICD-10-CM | POA: Insufficient documentation

## 2011-04-25 DIAGNOSIS — G20A1 Parkinson's disease without dyskinesia, without mention of fluctuations: Secondary | ICD-10-CM | POA: Insufficient documentation

## 2011-04-25 HISTORY — DX: Parkinson's disease: G20

## 2011-04-25 HISTORY — DX: Essential (primary) hypertension: I10

## 2011-04-25 HISTORY — DX: Unspecified osteoarthritis, unspecified site: M19.90

## 2011-04-25 HISTORY — DX: Parkinson's disease without dyskinesia, without mention of fluctuations: G20.A1

## 2011-04-25 LAB — CARDIAC PANEL(CRET KIN+CKTOT+MB+TROPI)
CK, MB: 1.8 ng/mL (ref 0.3–4.0)
Total CK: 37 U/L (ref 7–177)

## 2011-04-25 LAB — CBC
MCH: 30.1 pg (ref 26.0–34.0)
MCV: 88.2 fL (ref 78.0–100.0)
Platelets: 251 10*3/uL (ref 150–400)
RDW: 13.2 % (ref 11.5–15.5)
WBC: 7.2 10*3/uL (ref 4.0–10.5)

## 2011-04-25 LAB — DIFFERENTIAL
Basophils Absolute: 0 10*3/uL (ref 0.0–0.1)
Eosinophils Absolute: 0.1 10*3/uL (ref 0.0–0.7)
Eosinophils Relative: 1 % (ref 0–5)

## 2011-04-25 LAB — BASIC METABOLIC PANEL
Calcium: 9.2 mg/dL (ref 8.4–10.5)
GFR calc non Af Amer: >60 mL/min (ref >60)
Sodium: 140 mEq/L (ref 135–145)

## 2011-04-25 MED ORDER — GI COCKTAIL ~~LOC~~
15.0000 mL | Freq: Once | ORAL | Status: AC
  Administered 2011-04-25: 15 mL via ORAL
  Filled 2011-04-25: qty 30

## 2011-04-25 MED ORDER — NITROGLYCERIN 0.4 MG SL SUBL
0.4000 mg | SUBLINGUAL_TABLET | SUBLINGUAL | Status: DC | PRN
  Administered 2011-04-25: 0.4 mg via SUBLINGUAL
  Filled 2011-04-25: qty 25

## 2011-04-25 MED ORDER — ONDANSETRON HCL 4 MG/2ML IJ SOLN
4.0000 mg | Freq: Once | INTRAMUSCULAR | Status: AC
  Administered 2011-04-25: 4 mg via INTRAVENOUS
  Filled 2011-04-25: qty 2

## 2011-04-25 MED ORDER — PANTOPRAZOLE SODIUM 40 MG IV SOLR
40.0000 mg | Freq: Once | INTRAVENOUS | Status: AC
  Administered 2011-04-25: 40 mg via INTRAVENOUS
  Filled 2011-04-25: qty 40

## 2011-04-25 NOTE — ED Notes (Signed)
C/o mid chest pain since 1800, thought indigestion at first but pain continues

## 2011-04-25 NOTE — ED Provider Notes (Signed)
History     CSN: [id number] Arrival date & time: 04/25/2011 12:27 AM   Chief Complaint  Patient presents with  . Chest Pain     (Include location/radiation/quality/duration/timing/severity/associated sxs/prior treatment) HPI Comments: Seen 0030. Patient has taken aspirin which she was unablel to keep down and a zantac which she was unable to keep down. H/o reflux, uses prn OTC for symptoms. Patient had not eaten evening meal. Tried to eat some roast beef but was unable to keep it down. Nothing since has stayed down.  Patient is a 77 y.o. female presenting with abdominal pain. The history is provided by the patient.  Abdominal Pain The primary symptoms of the illness include abdominal pain. Episode onset: Began at 6:30 PM, burning in epigastric areas with radiation up into chest. The onset of the illness was gradual. The problem has not changed since onset. The abdominal pain has been gradually worsening since its onset. The abdominal pain radiates to the epigastric region. The severity of the abdominal pain is 6/10. The abdominal pain is relieved by nothing.  Associated symptoms comments: Nausea. Significant associated medical issues include GERD.     Past Medical History  Diagnosis Date  . Hypertension   . Parkinson disease   . Arthritis      Past Surgical History  Procedure Date  . Appendectomy   . Cholecystectomy     History reviewed. No pertinent family history.  History  Substance Use Topics  . Smoking status: Former Games developer  . Smokeless tobacco: Not on file  . Alcohol Use: No    OB History    Grav Para Term Preterm Abortions TAB SAB Ect Mult Living                  Review of Systems  Gastrointestinal: Positive for abdominal pain.  All other systems reviewed and are negative.    Allergies  Penicillins  Home Medications   Current Outpatient Rx  Name Route Sig Dispense Refill  . ACETAMINOPHEN 500 MG PO TABS Oral Take 500 mg by mouth every 6 (six)  hours as needed. For pain     . CARBIDOPA-LEVODOPA 25-100 MG PO TABS Oral Take 1 tablet by mouth.      Marland Kitchen LISINOPRIL 20 MG PO TABS Oral Take 20 mg by mouth daily.        Physical Exam    BP 164/101  Pulse 73  Temp(Src) 97.7 F (36.5 C) (Oral)  Resp 20  Ht 5' (1.524 m)  Wt 135 lb (61.236 kg)  BMI 26.37 kg/m2  SpO2 95%  Physical Exam  Nursing note and vitals reviewed. Constitutional: She is oriented to person, place, and time. She appears well-developed and well-nourished.  HENT:  Head: Normocephalic and atraumatic.  Eyes: EOM are normal.  Neck: Normal range of motion.  Cardiovascular: Normal rate and normal heart sounds.   Pulmonary/Chest: Effort normal and breath sounds normal.  Abdominal: Soft.  Musculoskeletal: Normal range of motion.  Neurological: She is alert and oriented to person, place, and time.  Skin: Skin is warm and dry.    ED Course  Procedures    Date: 04/25/2011 0025  Rate: 73  Rhythm: normal sinus rhythm  QRS Axis: normal  Intervals: normal  ST/T Wave abnormalities: normal  Conduction Disutrbances:none  Narrative Interpretation:   Old EKG Reviewed: none available  Results for orders placed during the hospital encounter of 04/25/11  CBC      Component Value Range   WBC 7.2  4.0 -  10.5 (K/uL)   RBC 4.59  3.87 - 5.11 (MIL/uL)   Hemoglobin 13.8  12.0 - 15.0 (g/dL)   HCT 78.2  95.6 - 21.3 (%)   MCV 88.2  78.0 - 100.0 (fL)   MCH 30.1  26.0 - 34.0 (pg)   MCHC 34.1  30.0 - 36.0 (g/dL)   RDW 08.6  57.8 - 46.9 (%)   Platelets 251  150 - 400 (K/uL)  DIFFERENTIAL      Component Value Range   Neutrophils Relative 53  43 - 77 (%)   Neutro Abs 3.8  1.7 - 7.7 (K/uL)   Lymphocytes Relative 40  12 - 46 (%)   Lymphs Abs 2.8  0.7 - 4.0 (K/uL)   Monocytes Relative 6  3 - 12 (%)   Monocytes Absolute 0.4  0.1 - 1.0 (K/uL)   Eosinophils Relative 1  0 - 5 (%)   Eosinophils Absolute 0.1  0.0 - 0.7 (K/uL)   Basophils Relative 0  0 - 1 (%)   Basophils Absolute  0.0  0.0 - 0.1 (K/uL)  BASIC METABOLIC PANEL      Component Value Range   Sodium 140  135 - 145 (mEq/L)   Potassium 3.5  3.5 - 5.1 (mEq/L)   Chloride 105  96 - 112 (mEq/L)   CO2 26  19 - 32 (mEq/L)   Glucose, Bld 105 (*) 70 - 99 (mg/dL)   BUN 22  6 - 23 (mg/dL)   Creatinine, Ser 6.29  0.50 - 1.10 (mg/dL)   Calcium 9.2  8.4 - 52.8 (mg/dL)   GFR calc non Af Amer >60  >60 (mL/min)   GFR calc Af Amer >60  >60 (mL/min)  CARDIAC PANEL(CRET KIN+CKTOT+MB+TROPI)      Component Value Range   Total CK 37  7 - 177 (U/L)   CK, MB 1.8  0.3 - 4.0 (ng/mL)   Troponin I <0.30  <0.30 (ng/mL)   Relative Index RELATIVE INDEX IS INVALID  0.0 - 2.5    Dg Chest Portable 1 View  04/25/2011  *RADIOLOGY REPORT*  Clinical Data: Chest pain  PORTABLE CHEST - 1 VIEW  Comparison: None.  Findings: Linear bibasilar opacities. Cardiomediastinal contours within normal limits.  No pleural effusion or pneumothorax. Multilevel degenerative changes.  No acute osseous abnormality identified.  IMPRESSION: Linear bibasilar opacities, likely scarring or atelectasis.  Can be better evaluated with PA and lateral radiographs the patient can tolerate.  Original Report Authenticated By: Waneta Martins, M.D.    Medications  nitroGLYCERIN (NITROSTAT) SL tablet 0.4 mg (0.4 mg Sublingual Given 04/25/11 0103)  pantoprazole (PROTONIX) injection 40 mg (40 mg Intravenous Given 04/25/11 0103)  ondansetron (ZOFRAN) injection 4 mg (4 mg Intravenous Given 04/25/11 0103)  gi cocktail (15 mL Oral Given 04/25/11 0103)   Patient with epigastric pain with radiation to chest that began at 1830. Labs unremarkable. EKG and chest xray normal. Resolution of symptoms with GI cocktail.Patient and son informed of clinical course, understand medical decision-making process, and agree with plan. Pt feels improved after observation and/or treatment in ED. MDM Reviewed: nursing note and vitals Interpretation: labs, ECG and x-ray Total time providing critical  care: 35 minutes.        Nicoletta Dress. Colon Branch, MD 04/25/11 4132

## 2011-04-28 ENCOUNTER — Encounter (HOSPITAL_COMMUNITY): Payer: Self-pay

## 2011-04-28 ENCOUNTER — Encounter (HOSPITAL_COMMUNITY)
Admission: RE | Admit: 2011-04-28 | Discharge: 2011-04-28 | Disposition: A | Discharge status: Home / Self Care | Payer: Medicare HMO | Source: Ambulatory Visit | Attending: Ophthalmology | Admitting: Ophthalmology

## 2011-04-28 ENCOUNTER — Other Ambulatory Visit: Payer: Self-pay

## 2011-04-28 HISTORY — DX: Cardiac murmur, unspecified: R01.1

## 2011-04-28 HISTORY — DX: Nontoxic single thyroid nodule: E04.1

## 2011-04-28 NOTE — Patient Instructions (Signed)
20 Heather Gilmore  04/28/2011   Your procedure is scheduled on:  05/03/11  Report to [facility] at 0630 AM.  Call this number if you have problems the morning of surgery: [phone]   Remember:   Do not eat food:After Midnight.  Do not drink clear liquids: After Midnight.  Take these medicines the morning of surgery with A SIP OF WATER: sinemet, lisinopril   Do not wear jewelry, make-up or nail polish.  Do not wear lotions, powders, or perfumes. You may wear deodorant.  Do not shave 48 hours prior to surgery.  Do not bring valuables to the hospital.  Contacts, dentures or bridgework may not be worn into surgery.  Leave suitcase in the car. After surgery it may be brought to your room.  For patients admitted to the hospital, checkout time is 11:00 AM the day of discharge.   Patients discharged the day of surgery will not be allowed to drive home.  Name and phone number of your driver: family  Special Instructions: N/A   Please read over the following fact sheets that you were given: Pain Booklet and Care and Recovery After Surgery   PATIENT INSTRUCTIONS POST-ANESTHESIA  IMMEDIATELY FOLLOWING SURGERY:  Do not drive or operate machinery for the first twenty four hours after surgery.  Do not make any important decisions for twenty four hours after surgery or while taking narcotic pain medications or sedatives.  If you develop intractable nausea and vomiting or a severe headache please notify your doctor immediately.  FOLLOW-UP:  Please make an appointment with your surgeon as instructed. You do not need to follow up with anesthesia unless specifically instructed to do so.  WOUND CARE INSTRUCTIONS (if applicable):  Keep a dry clean dressing on the anesthesia/puncture wound site if there is drainage.  Once the wound has quit draining you may leave it open to air.  Generally you should leave the bandage intact for twenty four hours unless there is drainage.  If the epidural site drains for more  than 36-48 hours please call the anesthesia department.  QUESTIONS?:  Please feel free to call your physician or the hospital operator if you have any questions, and they will be happy to assist you.     [facility] Anesthesia Department [address] [place] [phone]

## 2011-05-03 ENCOUNTER — Encounter (HOSPITAL_COMMUNITY)
Admission: RE | Disposition: A | Discharge status: Home / Self Care | Payer: Self-pay | Source: Ambulatory Visit | Attending: Ophthalmology

## 2011-05-03 ENCOUNTER — Encounter (HOSPITAL_COMMUNITY): Payer: Self-pay | Admitting: *Deleted

## 2011-05-03 ENCOUNTER — Ambulatory Visit (HOSPITAL_COMMUNITY): Payer: Medicare HMO | Admitting: Anesthesiology

## 2011-05-03 ENCOUNTER — Ambulatory Visit (HOSPITAL_COMMUNITY)
Admission: RE | Admit: 2011-05-03 | Discharge: 2011-05-03 | Disposition: A | Discharge status: Home / Self Care | Payer: Medicare HMO | Source: Ambulatory Visit | Attending: Ophthalmology | Admitting: Ophthalmology

## 2011-05-03 ENCOUNTER — Encounter (HOSPITAL_COMMUNITY): Payer: Self-pay | Admitting: Anesthesiology

## 2011-05-03 DIAGNOSIS — Z01810 Encounter for preprocedural cardiovascular examination: Secondary | ICD-10-CM | POA: Insufficient documentation

## 2011-05-03 DIAGNOSIS — G20 Parkinson's disease: Secondary | ICD-10-CM | POA: Insufficient documentation

## 2011-05-03 DIAGNOSIS — H2510 Age-related nuclear cataract, unspecified eye: Secondary | ICD-10-CM | POA: Insufficient documentation

## 2011-05-03 DIAGNOSIS — G20A1 Parkinson's disease without dyskinesia, without mention of fluctuations: Secondary | ICD-10-CM | POA: Insufficient documentation

## 2011-05-03 DIAGNOSIS — I10 Essential (primary) hypertension: Secondary | ICD-10-CM | POA: Insufficient documentation

## 2011-05-03 DIAGNOSIS — Z79899 Other long term (current) drug therapy: Secondary | ICD-10-CM | POA: Insufficient documentation

## 2011-05-03 HISTORY — PX: CATARACT EXTRACTION W/PHACO: SHX586

## 2011-05-03 SURGERY — PHACOEMULSIFICATION, CATARACT, WITH IOL INSERTION
Anesthesia: Monitor Anesthesia Care | Site: Eye | Laterality: Right | Wound class: Clean

## 2011-05-03 MED ORDER — TETRACAINE HCL 0.5 % OP SOLN
OPHTHALMIC | Status: AC
  Administered 2011-05-03: 1 [drp] via OPHTHALMIC
  Filled 2011-05-03: qty 2

## 2011-05-03 MED ORDER — LACTATED RINGERS IV SOLN
INTRAVENOUS | Status: DC
  Administered 2011-05-03: 500 mL via INTRAVENOUS

## 2011-05-03 MED ORDER — EPINEPHRINE HCL 1 MG/ML IJ SOLN
INTRAMUSCULAR | Status: AC
  Filled 2011-05-03: qty 1

## 2011-05-03 MED ORDER — FLURBIPROFEN SODIUM 0.03 % OP SOLN
1.0000 [drp] | OPHTHALMIC | Status: AC | PRN
  Administered 2011-05-03 (×3): 1 [drp] via OPHTHALMIC

## 2011-05-03 MED ORDER — MIDAZOLAM HCL 2 MG/2ML IJ SOLN
1.0000 mg | INTRAMUSCULAR | Status: DC | PRN
  Administered 2011-05-03: 2 mg via INTRAVENOUS

## 2011-05-03 MED ORDER — MIDAZOLAM HCL 2 MG/2ML IJ SOLN
INTRAMUSCULAR | Status: AC
  Administered 2011-05-03: 2 mg via INTRAVENOUS
  Filled 2011-05-03: qty 2

## 2011-05-03 MED ORDER — PHENYLEPHRINE HCL 2.5 % OP SOLN
1.0000 [drp] | OPHTHALMIC | Status: AC
  Administered 2011-05-03 (×2): 1 [drp] via OPHTHALMIC

## 2011-05-03 MED ORDER — FLURBIPROFEN SODIUM 0.03 % OP SOLN
OPHTHALMIC | Status: AC
  Administered 2011-05-03: 1 [drp] via OPHTHALMIC
  Filled 2011-05-03: qty 2.5

## 2011-05-03 MED ORDER — CYCLOPENTOLATE-PHENYLEPHRINE 0.2-1 % OP SOLN
OPHTHALMIC | Status: AC
  Administered 2011-05-03: 1 [drp] via OPHTHALMIC
  Filled 2011-05-03: qty 2

## 2011-05-03 MED ORDER — CYCLOPENTOLATE-PHENYLEPHRINE 0.2-1 % OP SOLN
1.0000 [drp] | OPHTHALMIC | Status: AC
  Administered 2011-05-03 (×3): 1 [drp] via OPHTHALMIC

## 2011-05-03 MED ORDER — TETRACAINE HCL 0.5 % OP SOLN
1.0000 [drp] | OPHTHALMIC | Status: AC
  Administered 2011-05-03 (×3): 1 [drp] via OPHTHALMIC

## 2011-05-03 MED ORDER — BSS IO SOLN
INTRAOCULAR | Status: DC | PRN
  Administered 2011-05-03: 15 mL via OPHTHALMIC

## 2011-05-03 MED ORDER — PHENYLEPHRINE HCL 2.5 % OP SOLN
OPHTHALMIC | Status: AC
  Administered 2011-05-03: 1 [drp] via OPHTHALMIC
  Filled 2011-05-03: qty 2

## 2011-05-03 MED ORDER — EPINEPHRINE HCL 1 MG/ML IJ SOLN
INTRAOCULAR | Status: DC | PRN
  Administered 2011-05-03: 08:00:00

## 2011-05-03 MED ORDER — PROVISC 10 MG/ML IO SOLN
INTRAOCULAR | Status: DC | PRN
  Administered 2011-05-03: 8.5 mg via OPHTHALMIC

## 2011-05-03 SURGICAL SUPPLY — 25 items
CAPSULAR TENSION RING-AMO (OPHTHALMIC RELATED) IMPLANT
CLOTH BEACON ORANGE TIMEOUT ST (SAFETY) ×1 IMPLANT
DUOVISC SYSTEM (INTRAOCULAR LENS)
EYE SHIELD UNIVERSAL CLEAR (GAUZE/BANDAGES/DRESSINGS) ×1 IMPLANT
GLOVE BIOGEL M 6.5 STRL (GLOVE) ×1 IMPLANT
GLOVE ECLIPSE 6.5 STRL STRAW (GLOVE) IMPLANT
GLOVE ECLIPSE 7.0 STRL STRAW (GLOVE) IMPLANT
GLOVE EXAM NITRILE LRG STRL (GLOVE) ×1 IMPLANT
GLOVE EXAM NITRILE MD LF STRL (GLOVE) IMPLANT
GLOVE SKINSENSE NS SZ6.5 (GLOVE)
GLOVE SKINSENSE STRL SZ6.5 (GLOVE) IMPLANT
HEALON 5 0.6 ML (INTRAOCULAR LENS) IMPLANT
KIT VITRECTOMY (OPHTHALMIC RELATED) IMPLANT
PAD ARMBOARD 7.5X6 YLW CONV (MISCELLANEOUS) ×1 IMPLANT
PROC W NO LENS (INTRAOCULAR LENS)
PROC W SPEC LENS (INTRAOCULAR LENS)
PROCESS W NO LENS (INTRAOCULAR LENS) IMPLANT
PROCESS W SPEC LENS (INTRAOCULAR LENS) IMPLANT
RING MALYGIN (MISCELLANEOUS) IMPLANT
SIGHTPATH CAT PROC W REG LENS (Ophthalmic Related) ×2 IMPLANT
SYSTEM DUOVISC (INTRAOCULAR LENS) IMPLANT
TAPE SURG TRANSPORE 1 IN (GAUZE/BANDAGES/DRESSINGS) IMPLANT
TAPE SURGICAL TRANSPORE 1 IN (GAUZE/BANDAGES/DRESSINGS) ×1
VISCOELASTIC ADDITIONAL (OPHTHALMIC RELATED) IMPLANT
WATER STERILE IRR 250ML POUR (IV SOLUTION) ×1 IMPLANT

## 2011-05-03 NOTE — H&P (Signed)
No change in H and P 

## 2011-05-03 NOTE — Anesthesia Postprocedure Evaluation (Signed)
  Anesthesia Post-op Note  Patient: Heather Gilmore  Procedure(s) Performed:  CATARACT EXTRACTION PHACO AND INTRAOCULAR LENS PLACEMENT (IOC) - CDE: 7.52  Patient Location:  Short Stay  Anesthesia Type: MAC  Level of Consciousness: awake  Airway and Oxygen Therapy: Patient Spontanous Breathing  Post-op Pain: none  Post-op Assessment: Post-op Vital signs reviewed, Patient's Cardiovascular Status Stable, Respiratory Function Stable, Patent Airway, No signs of Nausea or vomiting and Pain level controlled  Post-op Vital Signs: Reviewed and stable  Complications: No apparent anesthesia complications

## 2011-05-03 NOTE — Anesthesia Procedure Notes (Signed)
Procedure Name: MAC Date/Time: 05/03/2011 8:08 AM Performed by: Minerva Areola Pre-anesthesia Checklist: Patient identified, Patient being monitored, Emergency Drugs available, Timeout performed and Suction available Patient Re-evaluated:Patient Re-evaluated prior to inductionOxygen Delivery Method: Nasal Cannula

## 2011-05-03 NOTE — Transfer of Care (Signed)
Immediate Anesthesia Transfer of Care Note  Patient: Heather Gilmore  Procedure(s) Performed:  CATARACT EXTRACTION PHACO AND INTRAOCULAR LENS PLACEMENT (IOC) - CDE: 7.52  Patient Location: Shortstay  Anesthesia Type: MAC  Level of Consciousness: awake  Airway & Oxygen Therapy: Patient Spontanous Breathing   Post-op Assessment: Report given to PACU RN, Post -op Vital signs reviewed and stable and Patient moving all extremities  Post vital signs: Reviewed and stable  Complications: No apparent anesthesia complications

## 2011-05-03 NOTE — Anesthesia Preprocedure Evaluation (Addendum)
Anesthesia Evaluation  Name, MR# and DOB Patient awake  General Assessment Comment  Reviewed: Allergy & Precautions, H&P , NPO status , Patient's Chart, lab work & pertinent test results  Airway Mallampati: II TM Distance: >3 FB Neck ROM: Full    Dental  (+) Teeth Intact   Pulmonary  asthmaCOPD    pulmonary exam normalPulmonary Exam Normal     Cardiovascular hypertension, Pt. on medications Regular Normal    Neuro/Psych   GI/Hepatic/Renal        GERD Medicated and Controlled     Endo/Other    Abdominal   Musculoskeletal   Hematology   Peds  Reproductive/Obstetrics    Anesthesia Other Findings             Anesthesia Physical Anesthesia Plan  ASA: III  Anesthesia Plan: MAC   Post-op Pain Management:    Induction: Intravenous  Airway Management Planned: Nasal Cannula  Additional Equipment:   Intra-op Plan:   Post-operative Plan:   Informed Consent: I have reviewed the patients History and Physical, chart, labs and discussed the procedure including the risks, benefits and alternatives for the proposed anesthesia with the patient or authorized representative who has indicated his/her understanding and acceptance.     Plan Discussed with:   Anesthesia Plan Comments:         Anesthesia Quick Evaluation

## 2011-05-03 NOTE — Op Note (Signed)
Patient brought to the operating room and prepped and draped in the usual manner.  Lid speculum inserted in right eye.  Stab incision made at the twelve o'clock position.  Provisc instilled in the anterior chamber.   A 2.4 mm. Stab incision was made temporally.  An anterior capsulotomy was done with a bent 25 gauge needle.  The nucleus was hydrodissected.  The Phaco tip was inserted in the anterior chamber and the nucleus was emulsified.  CDE was 7.52.  The cortical material was then removed with the I and A tip.  Posterior capsule was the polished.  The anterior chamber was deepened with Provisc.  A 20.0 Diopter Rayner 570C IOL was then inserted in the capsular bag.  Provisc was then removed with the I and A tip.  The wound was then hydrated.  Patient sent to the Recovery Room in good condition with follow up in my office.

## 2011-05-06 ENCOUNTER — Encounter (HOSPITAL_COMMUNITY): Payer: Self-pay | Admitting: Ophthalmology

## 2011-05-13 ENCOUNTER — Encounter (HOSPITAL_COMMUNITY)
Admission: RE | Admit: 2011-05-13 | Discharge: 2011-05-13 | Payer: Medicare HMO | Source: Ambulatory Visit | Admitting: Ophthalmology

## 2011-05-13 ENCOUNTER — Encounter (HOSPITAL_COMMUNITY): Payer: Self-pay

## 2011-05-17 ENCOUNTER — Encounter (HOSPITAL_COMMUNITY): Payer: Self-pay | Admitting: Anesthesiology

## 2011-05-17 ENCOUNTER — Encounter (HOSPITAL_COMMUNITY)
Admission: RE | Disposition: A | Discharge status: Home / Self Care | Payer: Self-pay | Source: Ambulatory Visit | Attending: Ophthalmology

## 2011-05-17 ENCOUNTER — Ambulatory Visit (HOSPITAL_COMMUNITY)
Admission: RE | Admit: 2011-05-17 | Discharge: 2011-05-17 | Disposition: A | Discharge status: Home / Self Care | Payer: Medicare HMO | Source: Ambulatory Visit | Attending: Ophthalmology | Admitting: Ophthalmology

## 2011-05-17 ENCOUNTER — Ambulatory Visit (HOSPITAL_COMMUNITY): Payer: Medicare HMO | Admitting: Anesthesiology

## 2011-05-17 DIAGNOSIS — Z01812 Encounter for preprocedural laboratory examination: Secondary | ICD-10-CM | POA: Insufficient documentation

## 2011-05-17 DIAGNOSIS — I10 Essential (primary) hypertension: Secondary | ICD-10-CM | POA: Insufficient documentation

## 2011-05-17 DIAGNOSIS — Z01810 Encounter for preprocedural cardiovascular examination: Secondary | ICD-10-CM | POA: Insufficient documentation

## 2011-05-17 DIAGNOSIS — Z79899 Other long term (current) drug therapy: Secondary | ICD-10-CM | POA: Insufficient documentation

## 2011-05-17 DIAGNOSIS — G20A1 Parkinson's disease without dyskinesia, without mention of fluctuations: Secondary | ICD-10-CM | POA: Insufficient documentation

## 2011-05-17 DIAGNOSIS — G20 Parkinson's disease: Secondary | ICD-10-CM | POA: Insufficient documentation

## 2011-05-17 DIAGNOSIS — H2510 Age-related nuclear cataract, unspecified eye: Secondary | ICD-10-CM | POA: Insufficient documentation

## 2011-05-17 HISTORY — PX: CATARACT EXTRACTION W/PHACO: SHX586

## 2011-05-17 SURGERY — PHACOEMULSIFICATION, CATARACT, WITH IOL INSERTION
Anesthesia: Monitor Anesthesia Care | Site: Eye | Laterality: Left | Wound class: Clean

## 2011-05-17 MED ORDER — PHENYLEPHRINE HCL 2.5 % OP SOLN
1.0000 [drp] | OPHTHALMIC | Status: AC
  Administered 2011-05-17 (×3): 1 [drp] via OPHTHALMIC

## 2011-05-17 MED ORDER — PHENYLEPHRINE HCL 2.5 % OP SOLN
OPHTHALMIC | Status: AC
  Administered 2011-05-17: 1 [drp] via OPHTHALMIC
  Filled 2011-05-17: qty 2

## 2011-05-17 MED ORDER — LACTATED RINGERS IV SOLN
INTRAVENOUS | Status: DC
  Administered 2011-05-17: 09:00:00 via INTRAVENOUS

## 2011-05-17 MED ORDER — MIDAZOLAM HCL 2 MG/2ML IJ SOLN
INTRAMUSCULAR | Status: AC
  Filled 2011-05-17: qty 2

## 2011-05-17 MED ORDER — MIDAZOLAM HCL 5 MG/5ML IJ SOLN
INTRAMUSCULAR | Status: DC | PRN
Start: 2011-05-17 — End: 2011-05-17
  Administered 2011-05-17 (×2): 1 mg via INTRAVENOUS

## 2011-05-17 MED ORDER — FLURBIPROFEN SODIUM 0.03 % OP SOLN
OPHTHALMIC | Status: AC
  Administered 2011-05-17: 1 [drp] via OPHTHALMIC
  Filled 2011-05-17: qty 2.5

## 2011-05-17 MED ORDER — TETRACAINE HCL 0.5 % OP SOLN
OPHTHALMIC | Status: AC
  Administered 2011-05-17: 1 [drp] via OPHTHALMIC
  Filled 2011-05-17: qty 2

## 2011-05-17 MED ORDER — TETRACAINE HCL 0.5 % OP SOLN
1.0000 [drp] | OPHTHALMIC | Status: AC
  Administered 2011-05-17 (×3): 1 [drp] via OPHTHALMIC

## 2011-05-17 MED ORDER — EPINEPHRINE HCL 1 MG/ML IJ SOLN
INTRAOCULAR | Status: DC | PRN
  Administered 2011-05-17: 10:00:00

## 2011-05-17 MED ORDER — CYCLOPENTOLATE-PHENYLEPHRINE 0.2-1 % OP SOLN
OPHTHALMIC | Status: AC
  Administered 2011-05-17: 1 [drp] via OPHTHALMIC
  Filled 2011-05-17: qty 2

## 2011-05-17 MED ORDER — PROVISC 10 MG/ML IO SOLN
INTRAOCULAR | Status: DC | PRN
  Administered 2011-05-17: 8.5 mg via OPHTHALMIC

## 2011-05-17 MED ORDER — FLURBIPROFEN SODIUM 0.03 % OP SOLN
1.0000 [drp] | OPHTHALMIC | Status: AC
  Administered 2011-05-17 (×3): 1 [drp] via OPHTHALMIC

## 2011-05-17 MED ORDER — MIDAZOLAM HCL 2 MG/2ML IJ SOLN
1.0000 mg | INTRAMUSCULAR | Status: DC | PRN
  Administered 2011-05-17: 2 mg via INTRAVENOUS

## 2011-05-17 MED ORDER — CYCLOPENTOLATE-PHENYLEPHRINE 0.2-1 % OP SOLN
1.0000 [drp] | OPHTHALMIC | Status: AC
  Administered 2011-05-17 (×3): 1 [drp] via OPHTHALMIC

## 2011-05-17 MED ORDER — BSS IO SOLN
INTRAOCULAR | Status: DC | PRN
  Administered 2011-05-17: 15 mL via OPHTHALMIC

## 2011-05-17 MED ORDER — LACTATED RINGERS IV SOLN: INTRAVENOUS | Status: DC

## 2011-05-17 SURGICAL SUPPLY — 25 items
CAPSULAR TENSION RING-AMO (OPHTHALMIC RELATED) IMPLANT
CLOTH BEACON ORANGE TIMEOUT ST (SAFETY) ×1 IMPLANT
DUOVISC SYSTEM (INTRAOCULAR LENS)
EYE SHIELD UNIVERSAL CLEAR (GAUZE/BANDAGES/DRESSINGS) ×1 IMPLANT
GLOVE BIOGEL M 6.5 STRL (GLOVE) ×1 IMPLANT
GLOVE ECLIPSE 6.5 STRL STRAW (GLOVE) IMPLANT
GLOVE ECLIPSE 7.0 STRL STRAW (GLOVE) IMPLANT
GLOVE EXAM NITRILE LRG STRL (GLOVE) IMPLANT
GLOVE EXAM NITRILE MD LF STRL (GLOVE) ×1 IMPLANT
GLOVE SKINSENSE NS SZ6.5 (GLOVE)
GLOVE SKINSENSE STRL SZ6.5 (GLOVE) IMPLANT
HEALON 5 0.6 ML (INTRAOCULAR LENS) IMPLANT
KIT VITRECTOMY (OPHTHALMIC RELATED) IMPLANT
PAD ARMBOARD 7.5X6 YLW CONV (MISCELLANEOUS) ×1 IMPLANT
PROC W NO LENS (INTRAOCULAR LENS)
PROC W SPEC LENS (INTRAOCULAR LENS)
PROCESS W NO LENS (INTRAOCULAR LENS) IMPLANT
PROCESS W SPEC LENS (INTRAOCULAR LENS) IMPLANT
RING MALYGIN (MISCELLANEOUS) IMPLANT
SIGHTPATH CAT PROC W REG LENS (Ophthalmic Related) ×2 IMPLANT
SYSTEM DUOVISC (INTRAOCULAR LENS) IMPLANT
TAPE SURG TRANSPORE 1 IN (GAUZE/BANDAGES/DRESSINGS) IMPLANT
TAPE SURGICAL TRANSPORE 1 IN (GAUZE/BANDAGES/DRESSINGS) ×1
VISCOELASTIC ADDITIONAL (OPHTHALMIC RELATED) IMPLANT
WATER STERILE IRR 250ML POUR (IV SOLUTION) ×1 IMPLANT

## 2011-05-17 NOTE — Anesthesia Postprocedure Evaluation (Signed)
  Anesthesia Post-op Note  Patient: Heather Gilmore  Procedure(s) Performed:  CATARACT EXTRACTION PHACO AND INTRAOCULAR LENS PLACEMENT (IOC) - CDE 9.49  Patient Location: PACU and Short Stay  Anesthesia Type: MAC  Level of Consciousness: awake, alert , oriented and patient cooperative  Airway and Oxygen Therapy: Patient Spontanous Breathing  Post-op Pain: none  Post-op Assessment: Post-op Vital signs reviewed, Patient's Cardiovascular Status Stable, Respiratory Function Stable, Patent Airway and No signs of Nausea or vomiting  Post-op Vital Signs: Reviewed and stable  Complications: No apparent anesthesia complications

## 2011-05-17 NOTE — Anesthesia Preprocedure Evaluation (Signed)
Anesthesia Evaluation  Name, MR# and DOB Patient awake  General Assessment Comment  Reviewed: Allergy & Precautions, H&P , NPO status , Patient's Chart, lab work & pertinent test results  Airway Mallampati: II TM Distance: >3 FB Neck ROM: Full    Dental  (+) Teeth Intact   Pulmonary asthma COPD   Pulmonary exam normal       Cardiovascular hypertension, Pt. on medications Regular Normal    Neuro/Psych    GI/Hepatic GERD Medicated and Controlled  Endo/Other    Renal/GU      Musculoskeletal   Abdominal   Peds  Hematology   Anesthesia Other Findings   Reproductive/Obstetrics                           Anesthesia Physical Anesthesia Plan  ASA: III  Anesthesia Plan: MAC   Post-op Pain Management:    Induction:   Airway Management Planned: Nasal Cannula  Additional Equipment:   Intra-op Plan:   Post-operative Plan:   Informed Consent: I have reviewed the patients History and Physical, chart, labs and discussed the procedure including the risks, benefits and alternatives for the proposed anesthesia with the patient or authorized representative who has indicated his/her understanding and acceptance.     Plan Discussed with:   Anesthesia Plan Comments:         Anesthesia Quick Evaluation

## 2011-05-17 NOTE — Op Note (Signed)
Patient brought to the operating room and prepped and draped in the usual manner.  Lid speculum inserted in right eye.  Stab incision made at the twelve o'clock position.  Provisc instilled in the anterior chamber.   A 2.4 mm. Stab incision was made temporally.  An anterior capsulotomy was done with a bent 25 gauge needle.  The nucleus was hydrodissected.  The Phaco tip was inserted in the anterior chamber and the nucleus was emulsified.  CDE was 9.49.  The cortical material was then removed with the I and A tip.  Posterior capsule was the polished.  The anterior chamber was deepened with Provisc.  A 19.5 Diopter Rayner 570C IOL was then inserted in the capsular bag.  Provisc was then removed with the I and A tip.  The wound was then hydrated.  Patient sent to the Recovery Room in good condition with follow up in my office.

## 2011-05-17 NOTE — Transfer of Care (Signed)
Immediate Anesthesia Transfer of Care Note  Patient: Heather Gilmore  Procedure(s) Performed:  CATARACT EXTRACTION PHACO AND INTRAOCULAR LENS PLACEMENT (IOC) - CDE 9.49  Patient Location: PACU and Short Stay  Anesthesia Type: MAC  Level of Consciousness: awake, alert , oriented and patient cooperative  Airway & Oxygen Therapy: Patient Spontanous Breathing  Post-op Assessment: Report given to PACU RN, Post -op Vital signs reviewed and stable and Patient moving all extremities  Post vital signs: Reviewed and stable  Complications: No apparent anesthesia complications

## 2011-05-17 NOTE — H&P (Signed)
No change in H and P 

## 2011-05-20 ENCOUNTER — Encounter (HOSPITAL_COMMUNITY): Payer: Self-pay | Admitting: Ophthalmology

## 2011-07-06 ENCOUNTER — Other Ambulatory Visit: Payer: Self-pay | Admitting: Family Medicine

## 2011-07-06 DIAGNOSIS — Z139 Encounter for screening, unspecified: Secondary | ICD-10-CM

## 2011-07-14 ENCOUNTER — Ambulatory Visit (HOSPITAL_COMMUNITY)
Admission: RE | Admit: 2011-07-14 | Discharge: 2011-07-14 | Disposition: A | Discharge status: Home / Self Care | Payer: Medicare HMO | Source: Ambulatory Visit | Attending: Family Medicine | Admitting: Family Medicine

## 2011-07-14 DIAGNOSIS — Z1231 Encounter for screening mammogram for malignant neoplasm of breast: Secondary | ICD-10-CM | POA: Insufficient documentation

## 2011-07-14 DIAGNOSIS — Z139 Encounter for screening, unspecified: Secondary | ICD-10-CM

## 2011-07-19 ENCOUNTER — Other Ambulatory Visit: Payer: Self-pay | Admitting: Family Medicine

## 2011-07-19 DIAGNOSIS — R928 Other abnormal and inconclusive findings on diagnostic imaging of breast: Secondary | ICD-10-CM

## 2011-07-27 ENCOUNTER — Ambulatory Visit (HOSPITAL_COMMUNITY)
Admission: RE | Admit: 2011-07-27 | Discharge: 2011-07-27 | Disposition: A | Discharge status: Home / Self Care | Payer: Medicare HMO | Source: Ambulatory Visit | Attending: Family Medicine | Admitting: Family Medicine

## 2011-07-27 DIAGNOSIS — R928 Other abnormal and inconclusive findings on diagnostic imaging of breast: Secondary | ICD-10-CM | POA: Insufficient documentation

## 2011-11-18 ENCOUNTER — Ambulatory Visit (HOSPITAL_COMMUNITY)
Admission: RE | Admit: 2011-11-18 | Discharge: 2011-11-18 | Disposition: A | Discharge status: Home / Self Care | Payer: Medicare Other | Source: Ambulatory Visit | Attending: Neurology | Admitting: Neurology

## 2011-11-18 ENCOUNTER — Other Ambulatory Visit: Payer: Self-pay | Admitting: Neurology

## 2011-11-18 DIAGNOSIS — J329 Chronic sinusitis, unspecified: Secondary | ICD-10-CM | POA: Insufficient documentation

## 2011-11-18 DIAGNOSIS — G319 Degenerative disease of nervous system, unspecified: Secondary | ICD-10-CM | POA: Insufficient documentation

## 2011-11-18 DIAGNOSIS — M6281 Muscle weakness (generalized): Secondary | ICD-10-CM | POA: Insufficient documentation

## 2011-11-18 DIAGNOSIS — R29898 Other symptoms and signs involving the musculoskeletal system: Secondary | ICD-10-CM

## 2011-11-21 ENCOUNTER — Other Ambulatory Visit: Payer: Self-pay | Admitting: Neurology

## 2011-11-21 ENCOUNTER — Ambulatory Visit (HOSPITAL_COMMUNITY)
Admission: RE | Admit: 2011-11-21 | Discharge: 2011-11-21 | Disposition: A | Discharge status: Home / Self Care | Payer: Medicare Other | Source: Ambulatory Visit | Attending: Neurology | Admitting: Neurology

## 2011-11-21 DIAGNOSIS — G459 Transient cerebral ischemic attack, unspecified: Secondary | ICD-10-CM | POA: Insufficient documentation

## 2011-11-21 DIAGNOSIS — I10 Essential (primary) hypertension: Secondary | ICD-10-CM | POA: Insufficient documentation

## 2011-11-21 DIAGNOSIS — R209 Unspecified disturbances of skin sensation: Secondary | ICD-10-CM | POA: Insufficient documentation

## 2013-01-30 ENCOUNTER — Encounter: Payer: Self-pay | Admitting: *Deleted

## 2013-03-19 ENCOUNTER — Telehealth: Payer: Self-pay | Admitting: Family Medicine

## 2013-03-19 MED ORDER — LISINOPRIL 20 MG PO TABS: 20.0000 mg | ORAL_TABLET | Freq: Every day | ORAL | Status: DC

## 2013-03-19 NOTE — Telephone Encounter (Signed)
Patient needs refill on lisinopril (PRINIVIL,ZESTRIL) 20 MG tablet send to [facility].  Please call Patient. Thanks

## 2013-03-19 NOTE — Telephone Encounter (Signed)
Refill of lisinopril sent into [facility]. Patient was notified.

## 2013-05-27 ENCOUNTER — Ambulatory Visit (INDEPENDENT_AMBULATORY_CARE_PROVIDER_SITE_OTHER): Payer: Medicare Other | Admitting: Family Medicine

## 2013-05-27 ENCOUNTER — Encounter: Payer: Self-pay | Admitting: Family Medicine

## 2013-05-27 VITALS — BP 132/82 | Ht 60.0 in | Wt 134.6 lb

## 2013-05-27 DIAGNOSIS — I10 Essential (primary) hypertension: Secondary | ICD-10-CM

## 2013-05-27 DIAGNOSIS — R259 Unspecified abnormal involuntary movements: Secondary | ICD-10-CM

## 2013-05-27 DIAGNOSIS — Z23 Encounter for immunization: Secondary | ICD-10-CM

## 2013-05-27 DIAGNOSIS — J45909 Unspecified asthma, uncomplicated: Secondary | ICD-10-CM

## 2013-05-27 DIAGNOSIS — K219 Gastro-esophageal reflux disease without esophagitis: Secondary | ICD-10-CM

## 2013-05-27 DIAGNOSIS — G20 Parkinson's disease: Secondary | ICD-10-CM

## 2013-05-27 MED ORDER — CARBIDOPA-LEVODOPA 25-100 MG PO TABS: 1.0000 | ORAL_TABLET | Freq: Four times a day (QID) | ORAL | Status: DC

## 2013-05-27 MED ORDER — OXYBUTYNIN CHLORIDE ER 5 MG PO TB24: 5.0000 mg | ORAL_TABLET | Freq: Every day | ORAL | Status: DC

## 2013-05-27 MED ORDER — LISINOPRIL 20 MG PO TABS: 20.0000 mg | ORAL_TABLET | Freq: Every day | ORAL | Status: DC

## 2013-05-27 NOTE — Progress Notes (Signed)
Subjective:    Patient ID: Heather Gilmore, female    DOB: [birth year], 79 y.o.   MRN: [id number]  Hypertension This is a chronic problem. The current episode started more than 1 year ago. The problem is unchanged. The problem is controlled. Pertinent negatives include no anxiety or blurred vision. There are no associated agents to hypertension. Past treatments include ACE inhibitors. The current treatment provides moderate improvement. Compliance problems include diet.    Patient arrives for a follow up on blood pressure. Having problems with urinary urgency  in morning for 8 months. Sometimes loses urine both with coughing and sneezing also with sudden urge incontinence.  Breathing overall stable. Using the inhaler nightly. Sig wheezing this fall uses little salt. Walking a bit at times. States definitely does not want to go on a steroid inhaler  Results for orders placed during the hospital encounter of 04/25/11  CBC      Result Value Range   WBC 7.2  4.0 - 10.5 K/uL   RBC 4.59  3.87 - 5.11 MIL/uL   Hemoglobin 13.8  12.0 - 15.0 g/dL   HCT 40.9  81.1 - 91.4 %   MCV 88.2  78.0 - 100.0 fL   MCH 30.1  26.0 - 34.0 pg   MCHC 34.1  30.0 - 36.0 g/dL   RDW 78.2  95.6 - 21.3 %   Platelets 251  150 - 400 K/uL  DIFFERENTIAL      Result Value Range   Neutrophils Relative % 53  43 - 77 %   Neutro Abs 3.8  1.7 - 7.7 K/uL   Lymphocytes Relative 40  12 - 46 %   Lymphs Abs 2.8  0.7 - 4.0 K/uL   Monocytes Relative 6  3 - 12 %   Monocytes Absolute 0.4  0.1 - 1.0 K/uL   Eosinophils Relative 1  0 - 5 %   Eosinophils Absolute 0.1  0.0 - 0.7 K/uL   Basophils Relative 0  0 - 1 %   Basophils Absolute 0.0  0.0 - 0.1 K/uL  BASIC METABOLIC PANEL      Result Value Range   Sodium 140  135 - 145 mEq/L   Potassium 3.5  3.5 - 5.1 mEq/L   Chloride 105  96 - 112 mEq/L   CO2 26  19 - 32 mEq/L   Glucose, Bld 105 (*) 70 - 99 mg/dL   BUN 22  6 - 23 mg/dL   Creatinine, Ser 0.86  0.50 - 1.10 mg/dL   Calcium 9.2   8.4 - 57.8 mg/dL   GFR calc non Af Amer >60  >60 mL/min   GFR calc Af Amer >60  >60 mL/min  CARDIAC PANEL(CRET KIN+CKTOT+MB+TROPI)      Result Value Range   Total CK 37  7 - 177 U/L   CK, MB 1.8  0.3 - 4.0 ng/mL   Troponin I <0.30  <0.30 ng/mL   Relative Index RELATIVE INDEX IS INVALID  0.0 - 2.5   Results for orders placed during the hospital encounter of 04/25/11  CBC      Result Value Range   WBC 7.2  4.0 - 10.5 K/uL   RBC 4.59  3.87 - 5.11 MIL/uL   Hemoglobin 13.8  12.0 - 15.0 g/dL   HCT 46.9  62.9 - 52.8 %   MCV 88.2  78.0 - 100.0 fL   MCH 30.1  26.0 - 34.0 pg   MCHC 34.1  30.0 - 36.0 g/dL  RDW 13.2  11.5 - 15.5 %   Platelets 251  150 - 400 K/uL  DIFFERENTIAL      Result Value Range   Neutrophils Relative % 53  43 - 77 %   Neutro Abs 3.8  1.7 - 7.7 K/uL   Lymphocytes Relative 40  12 - 46 %   Lymphs Abs 2.8  0.7 - 4.0 K/uL   Monocytes Relative 6  3 - 12 %   Monocytes Absolute 0.4  0.1 - 1.0 K/uL   Eosinophils Relative 1  0 - 5 %   Eosinophils Absolute 0.1  0.0 - 0.7 K/uL   Basophils Relative 0  0 - 1 %   Basophils Absolute 0.0  0.0 - 0.1 K/uL  BASIC METABOLIC PANEL      Result Value Range   Sodium 140  135 - 145 mEq/L   Potassium 3.5  3.5 - 5.1 mEq/L   Chloride 105  96 - 112 mEq/L   CO2 26  19 - 32 mEq/L   Glucose, Bld 105 (*) 70 - 99 mg/dL   BUN 22  6 - 23 mg/dL   Creatinine, Ser 1.61  0.50 - 1.10 mg/dL   Calcium 9.2  8.4 - 09.6 mg/dL   GFR calc non Af Amer >60  >60 mL/min   GFR calc Af Amer >60  >60 mL/min  CARDIAC PANEL(CRET KIN+CKTOT+MB+TROPI)      Result Value Range   Total CK 37  7 - 177 U/L   CK, MB 1.8  0.3 - 4.0 ng/mL   Troponin I <0.30  <0.30 ng/mL   Relative Index RELATIVE INDEX IS INVALID  0.0 - 2.5   Patient currently challenged by Parkinson's disease. Under therapy from a specialist for this.  Review of Systems  Eyes: Negative for blurred vision.   no chest pain no abdominal pain no shortness of breath     Objective:   Physical  Exam  Alert no acute distress. HEENT in T. normal. Lungs no wheezes currently heart regular rate and rhythm. Blood pressure on repeat good     Assessment & Plan:  Impression #1 hypertension good control. #2 urge incontinence discussed patient would like treatment. #3 asthma control not ideal discussed patient very disinclined to adding stabilizer despite our recommendation. #4 Parkinson's disease plan trial of Ditropan XL. Other medications refilled. Diet exercise discussed. Flu shot given. Up-to-date on pneumonia shot. Check every 6 months. WSL

## 2013-05-28 DIAGNOSIS — G20 Parkinson's disease: Secondary | ICD-10-CM | POA: Insufficient documentation

## 2013-06-04 ENCOUNTER — Ambulatory Visit (HOSPITAL_COMMUNITY)
Admission: RE | Admit: 2013-06-04 | Discharge: 2013-06-04 | Disposition: A | Discharge status: Home / Self Care | Payer: Medicare Other | Source: Ambulatory Visit | Attending: Neurology | Admitting: Neurology

## 2013-06-04 DIAGNOSIS — R1310 Dysphagia, unspecified: Secondary | ICD-10-CM | POA: Insufficient documentation

## 2013-06-04 DIAGNOSIS — IMO0001 Reserved for inherently not codable concepts without codable children: Secondary | ICD-10-CM | POA: Insufficient documentation

## 2013-06-04 DIAGNOSIS — I10 Essential (primary) hypertension: Secondary | ICD-10-CM | POA: Insufficient documentation

## 2013-06-04 DIAGNOSIS — G20 Parkinson's disease: Secondary | ICD-10-CM | POA: Insufficient documentation

## 2013-06-04 DIAGNOSIS — R471 Dysarthria and anarthria: Secondary | ICD-10-CM | POA: Insufficient documentation

## 2013-06-04 DIAGNOSIS — G20A1 Parkinson's disease without dyskinesia, without mention of fluctuations: Secondary | ICD-10-CM | POA: Insufficient documentation

## 2013-06-04 DIAGNOSIS — G259 Extrapyramidal and movement disorder, unspecified: Secondary | ICD-10-CM

## 2013-06-04 NOTE — Evaluation (Addendum)
Speech Language Pathology Evaluation Patient Details  Name: Heather Gilmore MRN: [id number] Date of Birth: 10/20/1929  Today's Date: 06/04/2013 Time: 1100-1200 SLP Time Calculation (min): 60 min  Authorization: BC[employer] Medicare  Authorization Time Period:    Authorization Visit#:   of     Past Medical History:  Past Medical History  Diagnosis Date  . Hypertension   . Parkinson disease   . Arthritis   . 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 Asthma   . Thyroid nodule   . Hyperlipidemia   . IBS (irritable bowel syndrome)   . Essential tremor   . Rosacea    Past Surgical History:  Past Surgical History  Procedure Laterality Date  . Appendectomy    . Cholecystectomy  6 yrs ago    aph-Dr Lovell Sheehan  . Tonsillectomy      as child  . Cataract extraction w/phaco  05/03/2011    Procedure: CATARACT EXTRACTION PHACO AND INTRAOCULAR LENS PLACEMENT (IOC);  Surgeon: Loraine Leriche T. Nile Riggs;  Location: [facility];  Service: Ophthalmology;  Laterality: Right;  CDE: 7.52  . Cataract extraction w/phaco  05/17/2011    Procedure: CATARACT EXTRACTION PHACO AND INTRAOCULAR LENS PLACEMENT (IOC);  Surgeon: Loraine Leriche T. 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 : No Is the patient reluctant to leave their home because of a fear of falling? : No  Cognition  Overall Cognitive Status: Impaired/Different from baseline Arousal/Alertness:  Awake/alert Orientation Level: Oriented X4 Memory: Impaired Memory Impairment: Retrieval deficit;Decreased recall of new information;Prospective memory Awareness: Impaired Awareness Impairment: Anticipatory impairment Problem Solving: Appears intact Executive Function: Self Monitoring Self Monitoring: Impaired (Mild impairment for speech intelligibility self monitoring) Safety/Judgment: Appears intact  Comprehension  Auditory Comprehension Overall Auditory Comprehension: Appears within functional limits for tasks assessed Yes/No Questions: Within Functional Limits Commands: Within Functional Limits Conversation: Complex Visual Recognition/Discrimination Discrimination: Not tested Reading Comprehension Reading Status: Not tested  Expression  Expression Primary Mode of Expression: Verbal Verbal Expression Overall Verbal Expression: Impaired Initiation: No impairment Level of Generative/Spontaneous Verbalization: Conversation Repetition: No impairment Naming: Impairment Responsive: 51-75% accurate Pragmatics: No impairment Effective Techniques: Semantic cues;Phonemic cues Non-Verbal Means of Communication: Not applicable Written Expression Written Expression: Not tested  Oral/Motor  Oral Motor/Sensory Function Labial ROM: Within Functional Limits Labial Symmetry: Within Functional Limits Labial Strength: Within Functional Limits Labial Sensation: Within Functional Limits Lingual ROM: Within Functional Limits Lingual Symmetry: Within Functional Limits Lingual Strength: Reduced Lingual Sensation: Within Functional Limits Facial ROM: Reduced right;Reduced left Facial Symmetry: Within Functional Limits Facial Strength: Within Functional Limits Velum: Impaired right;Impaired left Mandible: Within Functional Limits Motor Speech Overall Motor Speech: Impaired Respiration: Impaired Level of Impairment: Sentence Phonation: Low vocal intensity Resonance: Within functional  limits Articulation: Within functional limitis Intelligibility: Intelligibility reduced Conversation: 75-100% accurate Motor Planning: Witnin functional limits Motor Speech Errors: Inconsistent Interfering Components: Hearing loss Effective Techniques: Increased vocal intensity;Over-articulate;Pause  SLP Goals  Home Exercise SLP Goal: Patient will Perform Home Exercise Program: with supervision, verbal cues  required/provided SLP Short Term Goals SLP Short Term Goal 1: Pt will implement memory strategies in functional tasks with 90% acc for recall of information and min cues. SLP Short Term Goal 2: Pt will implement speech intelligibility strategies (over articulation, increase vocal intensity...) at the sentence level in structured tasks for 95% effectiveness as judged by SLP and min cues. SLP Short Term Goal 3: Pt will complete mod-level word finding tasks with 90+% acc and min cues with use of word finding strategies.  SLP Short Term Goal 4: Pt will return demonstrate vocal warm up and intensity exercises with 90% acc and min assist. SLP Short Term Goal 5: Pt will demonstrate safe and efficient consumption of regular textures and thin liquids with use of compensatory strategies as needed. SLP Long Term Goals SLP Long Term Goal 1: Same as short term  Assessment/Plan  Patient Active Problem List   Diagnosis Date Noted  . 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 Parkinson's disease 05/28/2013  . HYPERTENSION 06/09/2009  . ASTHMA 06/09/2009  . GERD 06/09/2009  . SYNCOPE 06/09/2009  . TREMOR 06/09/2009   SLP - End of Session Activity Tolerance: Patient tolerated treatment well General Behavior During Therapy: WFL for tasks assessed/performed  SLP Assessment/Plan Clinical Impression Statement: Mrs. Staria Birkhead is a very pleasant 79 yo female who was referred for speech therapy by her neurologist, Dr. Gerilyn Pilgrim. Mrs. Faulk was diagnosed with Parkinson's Disease in 2010 and was started  on Sinemet to help control her tremors. She was recently seen by Dr. Gerilyn Pilgrim who increased her dosage to 4x/day. Mrs. Kucher feels that her major barriers right now are related to tremor and weakness. She initially stated that she didn't think she needed to be here, however after further interview she endorsed some solid food dysphagia (previously dilated by Dr. Karilyn Cota), drooling, difficulty with short term memory and word recall, and mild dysarthria. She demonstrates strong volitional movement orofacial structures, but reduced movement in conversation which negatively impacts speech intelligibility. She also has mild decreased breath support with sustained phonation of less than 10 seconds on average. Recommend speech therapy 2x/week for 4 weeks to address above deficits and increase independence with communication and cognition at home. Pt appears very motivated and has excellent family support. Speech Therapy Frequency: min 2x/week Duration: 4 weeks Treatment/Interventions: Cognitive reorganization;Internal/external aids;SLP instruction and feedback;Compensatory techniques;Functional tasks;Compensatory strategies Potential to Achieve Goals: Good Potential Considerations: Medical prognosis  GN Functional Assessment Tool Used: clinical judgement Functional Limitations: Motor speech Motor Speech Current Status [phone]): At least 20 percent but less than 40 percent impaired, limited or restricted Motor Speech Goal Status [phone]): At least 1 percent but less than 20 percent impaired, limited or restricted  Thank you,  Havery Moros, CCC-SLP [phone]  Shrinika Blatz 06/04/2013, 7:52 PM  Physician Documentation Your signature is required to indicate approval of the treatment plan as stated above.  Please sign and either send electronically or make a copy of this report for your files and return this physician signed original.  Please mark one 1.__approve of plan  2. ___approve of plan with the  following conditions.   ______________________________                                                          _____________________ Physician Signature  Date  

## 2013-09-27 ENCOUNTER — Other Ambulatory Visit: Payer: Self-pay

## 2013-09-27 MED ORDER — LISINOPRIL 20 MG PO TABS: 20.0000 mg | ORAL_TABLET | Freq: Every day | ORAL | Status: DC

## 2013-12-18 ENCOUNTER — Ambulatory Visit (INDEPENDENT_AMBULATORY_CARE_PROVIDER_SITE_OTHER): Payer: Managed Care, Other (non HMO) | Admitting: Family Medicine

## 2013-12-18 ENCOUNTER — Encounter: Payer: Self-pay | Admitting: Family Medicine

## 2013-12-18 VITALS — BP 132/80 | Ht 60.0 in | Wt 131.0 lb

## 2013-12-18 DIAGNOSIS — I10 Essential (primary) hypertension: Secondary | ICD-10-CM

## 2013-12-18 DIAGNOSIS — N3941 Urge incontinence: Secondary | ICD-10-CM

## 2013-12-18 DIAGNOSIS — K219 Gastro-esophageal reflux disease without esophagitis: Secondary | ICD-10-CM

## 2013-12-18 DIAGNOSIS — J45909 Unspecified asthma, uncomplicated: Secondary | ICD-10-CM

## 2013-12-18 MED ORDER — LISINOPRIL 20 MG PO TABS: 20.0000 mg | ORAL_TABLET | Freq: Every day | ORAL | Status: DC

## 2013-12-18 NOTE — Progress Notes (Signed)
   Subjective:    Patient ID: Heather Gilmore, female    DOB: 04/30/1930, 80 y.o.   MRN: [id number]  HPIHypertension check up. Checks blood pressure at home. Pt states readings have been good. Follows a healthy diet. Pt states she does not exercise enough.  Watching her salt intake. Not missing her blood pressure medicine.   Uses inhaler nightly, helps the wheezing, problem with hoarseness. Worse this comes and goes mostly not. Occasional wheezing. Patient does feels better using the inhaler at night.  Parkinson's meds helping only a little. Due to see her urologist soon. This is had major impact on her life understandably. Overall handling it okay at home at this time.  Knees trembling which is a new issue at Parkinson's. We will be discussing with her neurologist soon.  meds helped the urinating. This is the did show pan XL. Patient states definitely less stress incontinence and urgency.  Review of Systems No chronic cough no chest pain no abdominal pain no back pain ROS otherwise negative    Objective:   Physical Exam Alert no apparent distress. Blood pressure good on repeat HEENT normal. Resting tremor evident. Lungs clear heart regular rate and rhythm. Ankles without edema.       Assessment & Plan:  Impression 1 hypertension good control. #2 asthma clinically stable. Patient uses the inhaler probably more often than she needs to but it makes her feel mentally better so I am not opposed to this. #3 urge incontinence and. #4 Parkinson's discuss patient to discuss potential medication changes with her specialist soon. Plan maintain same medications. Diet exercise discussed. Check in 6 months. Patient declines screening physical WSL

## 2013-12-19 DIAGNOSIS — N3941 Urge incontinence: Secondary | ICD-10-CM | POA: Insufficient documentation

## 2014-01-16 ENCOUNTER — Other Ambulatory Visit: Payer: Self-pay | Admitting: Nurse Practitioner

## 2014-01-16 ENCOUNTER — Telehealth: Payer: Self-pay | Admitting: Family Medicine

## 2014-01-16 MED ORDER — OXYBUTYNIN CHLORIDE ER 5 MG PO TB24: 5.0000 mg | ORAL_TABLET | Freq: Every day | ORAL | Status: DC

## 2014-01-16 NOTE — Telephone Encounter (Signed)
Patient needs Rx for oxybutynin 5 mg.

## 2014-02-13 ENCOUNTER — Encounter: Payer: Self-pay | Admitting: Family Medicine

## 2014-02-13 ENCOUNTER — Ambulatory Visit (INDEPENDENT_AMBULATORY_CARE_PROVIDER_SITE_OTHER): Payer: Managed Care, Other (non HMO) | Admitting: Family Medicine

## 2014-02-13 VITALS — BP 102/54 | Temp 99.0°F | Ht 60.0 in | Wt 128.0 lb

## 2014-02-13 DIAGNOSIS — J45909 Unspecified asthma, uncomplicated: Secondary | ICD-10-CM

## 2014-02-13 DIAGNOSIS — J209 Acute bronchitis, unspecified: Secondary | ICD-10-CM

## 2014-02-13 DIAGNOSIS — G20 Parkinson's disease: Secondary | ICD-10-CM

## 2014-02-13 DIAGNOSIS — I10 Essential (primary) hypertension: Secondary | ICD-10-CM

## 2014-02-13 DIAGNOSIS — G20A1 Parkinson's disease without dyskinesia, without mention of fluctuations: Secondary | ICD-10-CM

## 2014-02-13 MED ORDER — ALBUTEROL SULFATE HFA 108 (90 BASE) MCG/ACT IN AERS: INHALATION_SPRAY | RESPIRATORY_TRACT | Status: DC

## 2014-02-13 MED ORDER — CEFDINIR 300 MG PO CAPS: 300.0000 mg | ORAL_CAPSULE | Freq: Two times a day (BID) | ORAL | Status: DC

## 2014-02-13 NOTE — Progress Notes (Signed)
   Subjective:    Patient ID: NARELLE SCHOENING, female    DOB: May 21, 1930, 80 y.o.   MRN: [id number]  Cough This is a new problem. The current episode started in the past 7 days. Associated symptoms include a fever and shortness of breath. She has tried prescription cough suppressant and OTC cough suppressant for the symptoms.  Needs refill on albuterol inhaler.   BP lowish  Less appetite  diminshed energy adn lethargy  Blood pressure yesterday at Dr. Freddie Apley office was 112/48. Taking lisinopril 20mg . Has been lightheaded and balance has been off.     Review of Systems  Constitutional: Positive for fever.  Respiratory: Positive for cough and shortness of breath.        Objective:   Physical Exam 108/70 blood pressure similar on repeat mild malaise. Tremor present. HEENT sinus congestion lungs bilateral minimal wheezes no tachypnea no crackles heart regular in rhythm.       Assessment & Plan:  Impression 1 likely parainfluenza with secondary bronchitis discussed #22 type control blood pressure with diminished intake the past week discussed #3 exacerbation of reactive airways discussed #4 exacerbation of Parkinson's features with condition discuss plan cut lisinopril in half next 7 days. Albuterol 2 sprays 4 times a day. Encourage liquids. Rest. Omnicef twice a day. WSL

## 2014-07-09 ENCOUNTER — Other Ambulatory Visit: Payer: Self-pay | Admitting: Family Medicine

## 2014-10-13 ENCOUNTER — Other Ambulatory Visit: Payer: Self-pay | Admitting: Family Medicine

## 2014-10-15 ENCOUNTER — Other Ambulatory Visit: Payer: Self-pay | Admitting: Family Medicine

## 2014-10-16 ENCOUNTER — Ambulatory Visit (INDEPENDENT_AMBULATORY_CARE_PROVIDER_SITE_OTHER): Payer: Medicare HMO | Admitting: Family Medicine

## 2014-10-16 ENCOUNTER — Encounter: Payer: Self-pay | Admitting: Family Medicine

## 2014-10-16 VITALS — BP 110/74 | Ht 60.0 in | Wt 124.0 lb

## 2014-10-16 DIAGNOSIS — I10 Essential (primary) hypertension: Secondary | ICD-10-CM

## 2014-10-16 DIAGNOSIS — G20A1 Parkinson's disease without dyskinesia, without mention of fluctuations: Secondary | ICD-10-CM

## 2014-10-16 DIAGNOSIS — J4520 Mild intermittent asthma, uncomplicated: Secondary | ICD-10-CM

## 2014-10-16 DIAGNOSIS — R259 Unspecified abnormal involuntary movements: Secondary | ICD-10-CM | POA: Diagnosis not present

## 2014-10-16 DIAGNOSIS — G20 Parkinson's disease: Secondary | ICD-10-CM

## 2014-10-16 MED ORDER — ALBUTEROL SULFATE HFA 108 (90 BASE) MCG/ACT IN AERS: INHALATION_SPRAY | RESPIRATORY_TRACT | Status: DC

## 2014-10-16 MED ORDER — LISINOPRIL 20 MG PO TABS: 20.0000 mg | ORAL_TABLET | Freq: Every day | ORAL | Status: DC

## 2014-10-16 NOTE — Progress Notes (Signed)
   Subjective:    Patient ID: Heather Gilmore, female    DOB: 1930/01/30, 81 y.o.   MRN: [id number] Patient arrives for extensive discussion.  Has Parkinson's disease. Currently sees specialist for this. States slowly worsening. Also notes significant short term memory loss. No other difficulties with memory loss. Patient somewhat concerned about this. Next  Reports occasional flare of asthma. Uses albuterol on a when necessary basis. Next  Compliant with blood pressure medicine. No obvious side effects or watching salt intake. Hypertension This is a chronic problem. The current episode started more than 1 year ago.   Dizziness ongoing.  Discuss taking vit d 50,000 units once weekly. Pt states Dr. Merlene Laughter put her on this about 1 month ago.   Review of Systems No headache no chest pain no back pain abdominal pain no change in bowel habits no blood in stool    Objective:   Physical Exam Alert no acute distress. HEENT normal. Lungs no wheezes currently. Heart regular in rhythm. Mild shuffling gait observed. Some muscular rigidity.       Assessment & Plan:  Impression #1 hypertension good control discussed #2 asthma decent control discussed. #3 Parkinson's disease discussed #4 vitamin D supplementation discussed plan easily 25 minutes spent most in discussion. Maintain same medications. Continue follow-up with specialist. Follow-up as scheduled WSL

## 2015-04-06 ENCOUNTER — Encounter: Payer: Self-pay | Admitting: Family Medicine

## 2015-04-06 ENCOUNTER — Ambulatory Visit (INDEPENDENT_AMBULATORY_CARE_PROVIDER_SITE_OTHER): Payer: Medicare HMO | Admitting: Family Medicine

## 2015-04-06 VITALS — BP 118/68 | Ht 60.0 in | Wt 121.0 lb

## 2015-04-06 DIAGNOSIS — J4520 Mild intermittent asthma, uncomplicated: Secondary | ICD-10-CM | POA: Diagnosis not present

## 2015-04-06 DIAGNOSIS — K219 Gastro-esophageal reflux disease without esophagitis: Secondary | ICD-10-CM | POA: Diagnosis not present

## 2015-04-06 DIAGNOSIS — G20 Parkinson's disease: Secondary | ICD-10-CM

## 2015-04-06 DIAGNOSIS — I10 Essential (primary) hypertension: Secondary | ICD-10-CM | POA: Diagnosis not present

## 2015-04-06 MED ORDER — CARBIDOPA-LEVODOPA 25-100 MG PO TABS: 1.0000 | ORAL_TABLET | Freq: Four times a day (QID) | ORAL | Status: DC

## 2015-04-06 MED ORDER — LISINOPRIL 20 MG PO TABS: 20.0000 mg | ORAL_TABLET | Freq: Every day | ORAL | Status: DC

## 2015-04-06 NOTE — Progress Notes (Signed)
   Subjective:    Patient ID: Heather Gilmore, female    DOB: [birth year], 81 y.o.   MRN: [id number]  Hypertension This is a chronic problem. The current episode started more than 1 year ago.   no obvious side effects from the blood pressure medication. Compliant with it.  Notes occasional wheezing. Uses albuterol when necessary. Does not want a preventative agent.   Left ear pain. Mostly hurts when chewing. Started about 1 week ago. Constant sinus cong and stuffiness and drainage. Now a yr round problem. Takes claritin with decongestant    having a lot of trouble getting to sleep., Does note some napping during the day  Muscles have been sore. Not able to walk.   Requesting handicapp parking permit to be filled out.    Review of Systems No headache no chest pain no back pain no abdominal pain no change in bowel habits    Objective:   Physical Exam Alert vitals stable. HEENT normal. Lungs clear no wheezes today heart regular in rhythm ankles no edema baseline tremor evident.       Assessment & Plan:  Impression progressive Parkinson's disease discussed no evidence of dementia at this time #2 hypertension good control #3 ear discomfort nonspecific no evidence of infection #4 reactive airways clinically stable #5 insomnia discussed plan medications refilled. Diet exercise discussed. Recheck in 6 months. WSL

## 2015-05-19 ENCOUNTER — Ambulatory Visit (INDEPENDENT_AMBULATORY_CARE_PROVIDER_SITE_OTHER): Payer: Medicare HMO | Admitting: *Deleted

## 2015-05-19 DIAGNOSIS — Z23 Encounter for immunization: Secondary | ICD-10-CM | POA: Diagnosis not present

## 2015-07-09 ENCOUNTER — Other Ambulatory Visit: Payer: Self-pay | Admitting: Family Medicine

## 2015-08-14 ENCOUNTER — Other Ambulatory Visit: Payer: Self-pay | Admitting: Nurse Practitioner

## 2015-10-06 ENCOUNTER — Ambulatory Visit (INDEPENDENT_AMBULATORY_CARE_PROVIDER_SITE_OTHER): Payer: PPO | Admitting: Family Medicine

## 2015-10-06 ENCOUNTER — Encounter: Payer: Self-pay | Admitting: Family Medicine

## 2015-10-06 VITALS — BP 126/66 | Ht 60.0 in | Wt 121.4 lb

## 2015-10-06 DIAGNOSIS — Z13220 Encounter for screening for lipoid disorders: Secondary | ICD-10-CM

## 2015-10-06 DIAGNOSIS — I10 Essential (primary) hypertension: Secondary | ICD-10-CM | POA: Diagnosis not present

## 2015-10-06 DIAGNOSIS — M25551 Pain in right hip: Secondary | ICD-10-CM

## 2015-10-06 DIAGNOSIS — Z79899 Other long term (current) drug therapy: Secondary | ICD-10-CM | POA: Diagnosis not present

## 2015-10-06 DIAGNOSIS — Z23 Encounter for immunization: Secondary | ICD-10-CM | POA: Diagnosis not present

## 2015-10-06 DIAGNOSIS — M25511 Pain in right shoulder: Secondary | ICD-10-CM

## 2015-10-06 MED ORDER — ALBUTEROL SULFATE HFA 108 (90 BASE) MCG/ACT IN AERS: INHALATION_SPRAY | RESPIRATORY_TRACT | Status: DC

## 2015-10-06 MED ORDER — OXYBUTYNIN CHLORIDE ER 5 MG PO TB24: 5.0000 mg | ORAL_TABLET | Freq: Every day | ORAL | Status: DC

## 2015-10-06 MED ORDER — DICLOFENAC SODIUM 75 MG PO TBEC: DELAYED_RELEASE_TABLET | ORAL | Status: DC

## 2015-10-06 MED ORDER — LISINOPRIL 20 MG PO TABS: 20.0000 mg | ORAL_TABLET | Freq: Every day | ORAL | Status: DC

## 2015-10-06 MED ORDER — PNEUMOCOCCAL 13-VAL CONJ VACC IM SUSP: 0.5000 mL | Freq: Once | INTRAMUSCULAR | Status: DC

## 2015-10-06 MED ORDER — CARBIDOPA-LEVODOPA 25-100 MG PO TABS: 1.0000 | ORAL_TABLET | Freq: Four times a day (QID) | ORAL | Status: DC

## 2015-10-06 NOTE — Progress Notes (Signed)
   Subjective:    Patient ID: Heather Gilmore, female    DOB: 1929/08/25, 82 y.o.   MRN: [id number]  Hypertension This is a chronic problem. The current episode started more than 1 year ago. There are no compliance problems.     Patient has c/o right hip, and right elbow. Patient had fall before christmas down steps and states pain is getting worse.banged up roght shoulder with a fall,,right hip painful with walking, Worse in the days after the injury  Pain radiates to the elbow  BP cked well elsdwhere generally good. Meds reviewed today. Patient generally does not miss a dose.  Patient having ongoing challenges with reactive airways. Occasional cough. Uses albuterol each night. Would prefer not to go back to steroid inhaler.  Patient took a fall. Fell on her right arm and right side. This was several weeks ago still having shoulder discomfort with extension. Still having hip pain with motion. Over-the-counter meds not helping much walking with a bit of a limp. X  Continues to see the specialist for her Parkinson's disease  Coughing now and then two sprays ea night, dim enrgy     Review of Systems No headache no chest pain no back pain and change bowel habits no blood in stool ROS otherwise negative    Objective:   Physical Exam Alert vital stable HET normal lungs clear heart rhythm right shoulder positive impingement sign right hip positive pain with lateral pressure on the trochanteric process range of motion decent. Ankles without edema positive neurological changes of Parkinson's persist       Assessment & Plan:  Impression 1 hypertension good control patient maintain same meds reviewed #2 Parkinson's disease followed by specialist #3 reactive airways fairly substantial with patient not desiring chronic steroid inhaler. #4 injury with multiple sites right shoulder positive impingement sign right hip positive tender over the trochanter which Heather Gilmore deal trochanteric bursitis  discussed plan x-ray right hip right shoulder. Trial of anti-inflammatories. If persists come back for an injection. Maintain same blood pressure medicine inhaler refilled check every 6 months WSL appropriate blood work

## 2015-10-07 DIAGNOSIS — G629 Polyneuropathy, unspecified: Secondary | ICD-10-CM | POA: Diagnosis not present

## 2015-10-07 DIAGNOSIS — G20 Parkinson's disease: Secondary | ICD-10-CM | POA: Diagnosis not present

## 2015-10-07 DIAGNOSIS — M542 Cervicalgia: Secondary | ICD-10-CM | POA: Diagnosis not present

## 2015-10-07 DIAGNOSIS — R269 Unspecified abnormalities of gait and mobility: Secondary | ICD-10-CM | POA: Diagnosis not present

## 2015-10-07 DIAGNOSIS — Z13220 Encounter for screening for lipoid disorders: Secondary | ICD-10-CM | POA: Diagnosis not present

## 2015-10-07 DIAGNOSIS — I10 Essential (primary) hypertension: Secondary | ICD-10-CM | POA: Diagnosis not present

## 2015-10-07 DIAGNOSIS — Z79899 Other long term (current) drug therapy: Secondary | ICD-10-CM | POA: Diagnosis not present

## 2015-10-08 ENCOUNTER — Encounter: Payer: Self-pay | Admitting: Family Medicine

## 2015-10-08 ENCOUNTER — Ambulatory Visit (HOSPITAL_COMMUNITY)
Admission: RE | Admit: 2015-10-08 | Discharge: 2015-10-08 | Disposition: A | Discharge status: Home / Self Care | Payer: PPO | Source: Ambulatory Visit | Attending: Family Medicine | Admitting: Family Medicine

## 2015-10-08 DIAGNOSIS — M1611 Unilateral primary osteoarthritis, right hip: Secondary | ICD-10-CM | POA: Diagnosis not present

## 2015-10-08 DIAGNOSIS — M7591 Shoulder lesion, unspecified, right shoulder: Secondary | ICD-10-CM | POA: Diagnosis not present

## 2015-10-08 DIAGNOSIS — M25551 Pain in right hip: Secondary | ICD-10-CM | POA: Insufficient documentation

## 2015-10-08 DIAGNOSIS — M25511 Pain in right shoulder: Secondary | ICD-10-CM | POA: Diagnosis not present

## 2015-10-08 LAB — BASIC METABOLIC PANEL
BUN / CREAT RATIO: 25 (ref 11–26)
BUN: 19 mg/dL (ref 8–27)
CHLORIDE: 102 mmol/L (ref 96–106)
CO2: 28 mmol/L (ref 18–29)
Calcium: 9.4 mg/dL (ref 8.7–10.3)
Creatinine, Ser: 0.75 mg/dL (ref 0.57–1.00)
GFR calc Af Amer: 85 mL/min/{1.73_m2} (ref >59)
GFR calc non Af Amer: 74 mL/min/{1.73_m2} (ref >59)
GLUCOSE: 91 mg/dL (ref 65–99)
Potassium: 4.2 mmol/L (ref 3.5–5.2)
SODIUM: 142 mmol/L (ref 134–144)

## 2015-10-08 LAB — HEPATIC FUNCTION PANEL
ALK PHOS: 99 IU/L (ref 39–117)
ALT: 5 IU/L (ref 0–32)
AST: 10 IU/L (ref 0–40)
Albumin: 4 g/dL (ref 3.5–4.7)
BILIRUBIN, DIRECT: 0.1 mg/dL (ref 0.00–0.40)
Bilirubin Total: 0.4 mg/dL (ref 0.0–1.2)
Total Protein: 6.8 g/dL (ref 6.0–8.5)

## 2015-10-08 LAB — LIPID PANEL
CHOL/HDL RATIO: 3.4 ratio (ref 0.0–4.4)
Cholesterol, Total: 198 mg/dL (ref 100–199)
HDL: 58 mg/dL (ref >39)
LDL Calculated: 119 mg/dL — ABNORMAL HIGH (ref 0–99)
TRIGLYCERIDES: 104 mg/dL (ref 0–149)
VLDL Cholesterol Cal: 21 mg/dL (ref 5–40)

## 2015-11-09 ENCOUNTER — Ambulatory Visit (INDEPENDENT_AMBULATORY_CARE_PROVIDER_SITE_OTHER): Payer: PPO | Admitting: Family Medicine

## 2015-11-09 ENCOUNTER — Encounter: Payer: Self-pay | Admitting: Family Medicine

## 2015-11-09 VITALS — Ht 60.0 in | Wt 121.0 lb

## 2015-11-09 DIAGNOSIS — M7061 Trochanteric bursitis, right hip: Secondary | ICD-10-CM | POA: Diagnosis not present

## 2015-11-09 DIAGNOSIS — S0083XA Contusion of other part of head, initial encounter: Secondary | ICD-10-CM | POA: Diagnosis not present

## 2015-11-09 DIAGNOSIS — G20 Parkinson's disease: Secondary | ICD-10-CM | POA: Diagnosis not present

## 2015-11-09 MED ORDER — METHYLPREDNISOLONE ACETATE 40 MG/ML IJ SUSP: 40.0000 mg | Freq: Once | INTRAMUSCULAR | Status: DC

## 2015-11-09 NOTE — Progress Notes (Signed)
   Subjective:    Patient ID: ANDE GOEWEY, female    DOB: 05/04/1930, 82 y.o.   MRN: [id number]  HPI  patient arrives office for evaluation. Continues to experience challenges with Parkinson's disease. Once again patient took a fall. She claims compliance with her medication. More unsteadiness. States her feet were tangled up in the [place]   struck her head noted a lot of bleeding contusions swelling left for head this is overall improved. Next   fell once again on right hip see prior note did not go [place] emergency [place] Patient [place] with continued c/o of right hip pain-getting worse every day.  Review of Systems  no vomiting or diarrhea    Objective:   Physical Exam  alert vital stable HEENT normal lungs clear heart rare rhythm left facial contusion resolving cough will rigidity still present tremor present right hip substantial lateral tenderness    decent range of motion of hip exquisitely tender over trochanter patient was prepped draped injected with 1 mL Depo-Medrol 2 mL Xylocaine.       Assessment & Plan:   impression 1 contusion face improving stable #2 Parkinson's discussed #3 trochanteric bursitis right hip plan local measures discussed symptom care discussed WSL

## 2015-11-10 ENCOUNTER — Other Ambulatory Visit: Payer: Self-pay | Admitting: Family Medicine

## 2015-11-10 ENCOUNTER — Ambulatory Visit (HOSPITAL_COMMUNITY): Admission: RE | Admit: 2015-11-10 | Payer: PPO | Source: Ambulatory Visit

## 2015-11-10 ENCOUNTER — Other Ambulatory Visit: Payer: Self-pay

## 2015-11-10 ENCOUNTER — Ambulatory Visit (HOSPITAL_COMMUNITY)
Admission: RE | Admit: 2015-11-10 | Discharge: 2015-11-10 | Disposition: A | Discharge status: Home / Self Care | Payer: PPO | Source: Ambulatory Visit | Attending: Family Medicine | Admitting: Family Medicine

## 2015-11-10 DIAGNOSIS — M25551 Pain in right hip: Secondary | ICD-10-CM

## 2015-11-10 DIAGNOSIS — S79911A Unspecified injury of right hip, initial encounter: Secondary | ICD-10-CM | POA: Diagnosis not present

## 2015-11-10 DIAGNOSIS — M160 Bilateral primary osteoarthritis of hip: Secondary | ICD-10-CM | POA: Insufficient documentation

## 2015-12-01 DIAGNOSIS — M1611 Unilateral primary osteoarthritis, right hip: Secondary | ICD-10-CM | POA: Diagnosis not present

## 2015-12-21 ENCOUNTER — Encounter: Payer: Self-pay | Admitting: *Deleted

## 2015-12-21 ENCOUNTER — Other Ambulatory Visit: Payer: Self-pay | Admitting: Family Medicine

## 2015-12-21 ENCOUNTER — Other Ambulatory Visit: Payer: Self-pay | Admitting: *Deleted

## 2015-12-22 ENCOUNTER — Other Ambulatory Visit: Payer: Self-pay | Admitting: Family Medicine

## 2015-12-22 DIAGNOSIS — H52203 Unspecified astigmatism, bilateral: Secondary | ICD-10-CM | POA: Diagnosis not present

## 2015-12-22 DIAGNOSIS — H524 Presbyopia: Secondary | ICD-10-CM | POA: Diagnosis not present

## 2015-12-22 DIAGNOSIS — Z961 Presence of intraocular lens: Secondary | ICD-10-CM | POA: Diagnosis not present

## 2016-01-12 DIAGNOSIS — R42 Dizziness and giddiness: Secondary | ICD-10-CM | POA: Diagnosis not present

## 2016-01-12 DIAGNOSIS — M1611 Unilateral primary osteoarthritis, right hip: Secondary | ICD-10-CM | POA: Diagnosis not present

## 2016-01-14 ENCOUNTER — Other Ambulatory Visit: Payer: Self-pay | Admitting: Family Medicine

## 2016-01-26 ENCOUNTER — Ambulatory Visit: Payer: PPO | Admitting: Family Medicine

## 2016-02-11 ENCOUNTER — Ambulatory Visit (INDEPENDENT_AMBULATORY_CARE_PROVIDER_SITE_OTHER): Payer: PPO | Admitting: Family Medicine

## 2016-02-11 ENCOUNTER — Encounter: Payer: Self-pay | Admitting: Family Medicine

## 2016-02-11 VITALS — BP 110/72 | Ht 60.0 in | Wt 117.6 lb

## 2016-02-11 DIAGNOSIS — G20 Parkinson's disease: Secondary | ICD-10-CM | POA: Diagnosis not present

## 2016-02-11 DIAGNOSIS — I10 Essential (primary) hypertension: Secondary | ICD-10-CM

## 2016-02-11 DIAGNOSIS — J4520 Mild intermittent asthma, uncomplicated: Secondary | ICD-10-CM

## 2016-02-11 DIAGNOSIS — R1310 Dysphagia, unspecified: Secondary | ICD-10-CM

## 2016-02-11 MED ORDER — LISINOPRIL 10 MG PO TABS: 10.0000 mg | ORAL_TABLET | Freq: Every day | ORAL | Status: DC

## 2016-02-11 MED ORDER — PANTOPRAZOLE SODIUM 40 MG PO TBEC: 40.0000 mg | DELAYED_RELEASE_TABLET | Freq: Every day | ORAL | Status: DC

## 2016-02-11 NOTE — Progress Notes (Signed)
   Subjective:    Patient ID: Heather Gilmore, female    DOB: Dec 28, 1929, 82 y.o.   MRN: [id number]  HPI Patient arrives for a follow up from recent ortho appt. Patient states her ortho is concerned that her blood pressure drops when she stands  Compliant blood pressure medication. No obvious side effects. Watching salt intake. Next  Ongoing management of Parkinson's disease. Patient somewhat frustrated.  Continued asthma. No longer uses albuterol night. Just on a when necessary basis. Eating only once every couple weeks.  Does admit to lightheadedness and staggering sensation female after standing  States food getting stuck at times. Has to going vomit while having her female friend several times per week. On further history is had multiple esophageal dilatations in the past    Review of Systems No headache, no major weight loss or weight gain, no chest pain no back pain abdominal pain no change in bowel habits complete ROS otherwise negative     Objective:   Physical Exam 110/70 laying down 100/64 standing tremor evident positive neuromuscular rigidity lungs clear heart regular in rhythm abdomen benign       Assessment & Plan:  Impression hypertension to tight a control with orthostatic element discussed 2 asthma clinically stable on when necessary rare inhaler #3 solid food dysphagia discussed needs referral rationale discussed plan lisinopril dose adjusted. Diet exercise discussed. Recheck in several months. GI referral. WSL

## 2016-02-12 ENCOUNTER — Encounter: Payer: Self-pay | Admitting: Family Medicine

## 2016-02-16 ENCOUNTER — Telehealth (HOSPITAL_COMMUNITY): Payer: Self-pay | Admitting: Physical Therapy

## 2016-02-16 ENCOUNTER — Ambulatory Visit (HOSPITAL_COMMUNITY): Payer: PPO | Attending: Sports Medicine | Admitting: Physical Therapy

## 2016-02-16 DIAGNOSIS — R262 Difficulty in walking, not elsewhere classified: Secondary | ICD-10-CM | POA: Diagnosis not present

## 2016-02-16 DIAGNOSIS — M25551 Pain in right hip: Secondary | ICD-10-CM | POA: Diagnosis not present

## 2016-02-16 DIAGNOSIS — M6281 Muscle weakness (generalized): Secondary | ICD-10-CM

## 2016-02-16 DIAGNOSIS — R29898 Other symptoms and signs involving the musculoskeletal system: Secondary | ICD-10-CM | POA: Diagnosis not present

## 2016-02-16 DIAGNOSIS — R2681 Unsteadiness on feet: Secondary | ICD-10-CM

## 2016-02-16 NOTE — Telephone Encounter (Signed)
Filled out and put referral request for Speech Therapy for patient in outgoing box at front desk.  Deniece Ree PT, DPT [phone]

## 2016-02-16 NOTE — Therapy (Signed)
[facility] [address] [place], [place], [address] Phone: [phone]   Fax:  [phone]  Physical Therapy Evaluation  Patient Details  Name: Heather Gilmore MRN: [id number] Date of Birth: [birth year] Referring Provider: Teresa Coombs   Encounter Date: 02/16/2016      PT End of Session - 02/16/16 1208    Visit Number 1   Number of Visits 16   Date for PT Re-Evaluation 03/15/16   Authorization Type [employer]    Authorization Time Period 02/16/16 to 04/18/16   Authorization - Visit Number 1   Authorization - Number of Visits 10   PT Start Time [id number]   PT Stop Time 1200   PT Time Calculation (min) 43 min   Activity Tolerance Patient tolerated treatment well   Behavior During Therapy [facility] for tasks assessed/performed      Past Medical History  Diagnosis Date  . Hypertension   . Parkinson disease ([place])   . Arthritis   . 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 Asthma   . Thyroid nodule   . Hyperlipidemia   . IBS (irritable bowel syndrome)   . Essential tremor   . Rosacea     Past Surgical History  Procedure Laterality Date  . Appendectomy    . Cholecystectomy  6 yrs ago    aph-Dr Arnoldo Morale  . Tonsillectomy      as child  . Cataract extraction w/phaco  05/03/2011    Procedure: CATARACT EXTRACTION PHACO AND INTRAOCULAR LENS PLACEMENT (IOC);  Surgeon: Elta Guadeloupe T. Gershon Crane;  Location: [facility];  Service: Ophthalmology;  Laterality: Right;  CDE: 7.52  . Cataract extraction w/phaco  05/17/2011    Procedure: CATARACT EXTRACTION PHACO AND INTRAOCULAR LENS PLACEMENT (IOC);  Surgeon: Elta Guadeloupe T. Gershon Crane;  Location: [facility];  Service: Ophthalmology;  Laterality: Left;  CDE 9.49    There were no vitals filed for this visit.       Subjective Assessment - 02/16/16 1120    Subjective Patient reports that she was diagnosed with PD about 2 years ago, she has not had a lot of changes since then as it has stayed pretty steady. She  states she has trouble with her balance and walking. Her R hip is bothering her as well- she fell in November of 2016, then fell again in January of 2017 on her R hip both times. No fracture was found via x-rays. Her MD has just been watching her hip since that time.    Pertinent History HTN, history of syncope, asthma, IBS    How long can you sit comfortably? unlimited    How long can you stand comfortably? 5 minutes due to R hip and weakness    How long can you walk comfortably? 5 minutes    Patient Stated Goals get balance better, prevent more falls    Currently in Pain? Yes   Pain Score 2    Pain Location Hip   Pain Orientation Right   Pain Descriptors / Indicators Dull;Sore   Pain Type Chronic pain   Pain Radiating Towards none    Pain Onset More than a month ago   Pain Frequency Intermittent   Aggravating Factors  unsure    Pain Relieving Factors unsure    Effect of Pain on Daily Activities limits activity             [facility] PT Assessment - 02/16/16 0001    Assessment   Medical Diagnosis  parkinsons and R hip paiin    Referring Provider Teresa Coombs    Onset Date/Surgical Date --  parkinsons 2 years ago, R hip last November    Next MD Visit Dr. Paulla Fore in August    [place]   Has the patient fallen in the past 6 months Yes   How many times? 1  but an additional one in November 2017   Has the patient had a decrease in activity level because of a fear of falling?  Yes   Is the patient reluctant to leave their home because of a fear of falling?  Yes   Prior Function   Level of Independence Independent;Independent with basic ADLs;Independent with gait;Independent with transfers   Vocation Retired   Leisure none    Observation/Other Assessments   Observations retro-gait reveals shuffling pattern with flexed posture, very small step lengths and poor weight shifting; (+) scour and FABER R hip    Strength   Right Hip Flexion 4+/5   Right Hip ABduction 3/5  hip flexor  compensation    Left Hip Flexion 4+/5   Left Hip ABduction 3/5   Right Knee Flexion 4-/5   Right Knee Extension 4-/5   Left Knee Flexion 4/5   Left Knee Extension 4/5   Right Ankle Dorsiflexion 5/5   Left Ankle Dorsiflexion 5/5   Ambulation/Gait   Gait Comments flexed posture with slight shuffling gait/reduced step lengths and poor toe clearnace; tends to drift to R and often trips over obstacles on this side, reduced gait speed    6 minute walk test results    Aerobic Endurance Distance Walked 339   Endurance additional comments 3MWT, no device    Berg Balance Test   Sit to Stand Able to stand without using hands and stabilize independently   Standing Unsupported Able to stand safely 2 minutes   Sitting with Back Unsupported but Feet Supported on Floor or Stool Able to sit safely and securely 2 minutes   Stand to Sit Sits safely with minimal use of hands   Transfers Able to transfer safely, minor use of hands   Standing Unsupported with Eyes Closed Able to stand 10 seconds safely   Standing Ubsupported with Feet Together Able to place feet together independently and stand 1 minute safely   From Standing, Reach Forward with Outstretched Arm Can reach forward >12 cm safely (5")   From Standing Position, Pick up Object from Floor Able to pick up shoe safely and easily   From Standing Position, Turn to Look Behind Over each Shoulder Turn sideways only but maintains balance   Turn 360 Degrees Able to turn 360 degrees safely but slowly   Standing Unsupported, Alternately Place Feet on Step/Stool Able to stand independently and safely and complete 8 steps in 20 seconds   Standing Unsupported, One Foot in [employer] balance while stepping or standing   Standing on One Leg Unable to try or needs assist to prevent fall   Total Score 43                           PT Education - 02/16/16 1208    Education provided Yes   Education Details prognosis, POC; referral to speech  tehrapy for drooling and choking; importance of monitoring BP, relation of orthostatic and low BP to syncope    Person(s) Educated Patient   Methods Explanation   Comprehension Verbalized understanding  PT Short Term Goals - 02/16/16 1216    PT SHORT TERM GOAL #1   Title Patient will demonstrate improved gait mechanics in forward and backwards directions, including improved posture, step lengths, foot clearance and no drifting to either side in order to improve mobilty and reduce fall risk   Time 4   Period Weeks   Status New   PT SHORT TERM GOAL #2   Title Patient willl be able to tolerate at least 15 minutes of weight bearing with no increased pain in order to improve function within the community and at home    Time 4   Period Weeks   Status New   PT SHORT TERM GOAL #3   Title Patient will demonstrate improved posture during all functional situations in order to reduce fall risk and assist in improving overall mobility    Time 4   Period Weeks   Status New   PT SHORT TERM GOAL #4   Title Patient will experience no more than 1/10 pain in her R hip in order to demonstrate overall improvement of condition and improve QOL    Time 4   Period Weeks   Status New   PT SHORT TERM GOAL #5   Title Patient will be independent in correctly and consistently performing appropriate HEP, to be updated PRN    Time 4   Period Weeks   Status New           PT Long Term Goals - 02/16/16 1219    PT LONG TERM GOAL #1   Title Patient will demonstrate strength 5/5 in all tested muscle groups in order to reduce pain and improve function    Time 8   Period Weeks   Status New   PT LONG TERM GOAL #2   Title Patient will score at least 51 on BERG balance test in order to show improved balance/reduced fall risk    Time 8   Period Weeks   Status New   PT LONG TERM GOAL #3   Title Patient to be able to ambulate at least 656ft during 3MWT in order to demonstrate improved mobilty for home  and community ambulation    Time 8   Period Weeks   Status New   PT LONG TERM GOAL #4   Title Patient to be participatory in regular exercise at senior center, at least 3 days per week, in order to improve QOL and assist in maintaining regular exercise habit    Time 8   Period Weeks   Status New   PT LONG TERM GOAL #5   Title Patient to be able to tolerate weight bearing at least 30-45 minutes with no exacerbation of pain in order to improve function and QOL    Time 8   Period Weeks   Status New               Plan - 02/16/16 1210    Clinical Impression Statement Patient arrives today presenting with Parkinsons and R hip pain; she states that she does have significant concerns about her balance and walking as well as her ability to swallow as she reports she has been drooling and choking on food quite a bit. Upon examination, patient reveals significant gait deviations, significant unsteadiness, functional weakness, and reduced functional activity tolerance. Overall, she presents as classic Parkinsons Disease; at this time R hip pain appears related to/consistent with OA as identified by MD, and will likely improve with functional postural training  and muscle strengthening. Patient did have ongoing dizziness during session and reports her MD is aware; per MD notes in EPIC, they are considering her dizziness to be related to BP/orthostatic issues and skilled PT services will regularly monitor this moving forward during sessions. At this time recommend skilled PT services in order to address functional limitations, reduce fall risk, and optimize level of function; will also plan to send referral to MD for speech therapy to address choking and drooling.    Rehab Potential Good   PT Frequency 2x / week   PT Duration 8 weeks   PT Treatment/Interventions ADLs/Self Care Home Management;Cryotherapy;Moist Heat;Gait training;Stair training;Functional mobility training;Therapeutic  activities;Therapeutic exercise;Balance training;Neuromuscular re-education;Patient/family education;Manual techniques;Passive range of motion;Energy conservation;Taping   PT Next Visit Plan ASSIGN HEP; begin PWR training, gait/balance training. Also work on functional strengthening for LEs and hips.    PT Home Exercise Plan will give next session    Recommended Other Services speech therapy for PD    Consulted and Agree with Plan of Care Patient      Patient will benefit from skilled therapeutic intervention in order to improve the following deficits and impairments:  Abnormal gait, Hypomobility, Decreased activity tolerance, Decreased strength, Pain, Decreased balance, Decreased mobility, Difficulty walking, Improper body mechanics, Decreased coordination, Impaired flexibility, Postural dysfunction  Visit Diagnosis: Difficulty in walking, not elsewhere classified - Plan: PT plan of care cert/re-cert  Unsteadiness on feet - Plan: PT plan of care cert/re-cert  Pain in right hip - Plan: PT plan of care cert/re-cert  Muscle weakness (generalized) - Plan: PT plan of care cert/re-cert  Other symptoms and signs involving the musculoskeletal system - Plan: PT plan of care cert/re-cert      G-Codes - [id number] 1222    Functional Assessment Tool Used Based on skilled clinical assessment of balance, strength, posture, gait, fall risk    Functional Limitation Mobility: Walking and moving around   Mobility: Walking and Moving Around Current Status [phone]) At least 60 percent but less than 80 percent impaired, limited or restricted   Mobility: Walking and Moving Around Goal Status [phone]) At least 40 percent but less than 60 percent impaired, limited or restricted       Problem List Patient Active Problem List   Diagnosis Date Noted  . Urge incontinence 12/19/2013  . 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 Parkinson's disease ([place]) 05/28/2013  . Essential hypertension 06/09/2009  . Asthma  06/09/2009  . GERD 06/09/2009  . SYNCOPE 06/09/2009  . Abnormal involuntary movement 06/09/2009    Deniece Ree PT, DPT [facility] [address] [place], [place], [address] Phone: [phone]   Fax:  [phone]  Name: Heather Gilmore MRN: [id number] Date of Birth: 1929/11/04

## 2016-02-17 ENCOUNTER — Other Ambulatory Visit (INDEPENDENT_AMBULATORY_CARE_PROVIDER_SITE_OTHER): Payer: Self-pay | Admitting: Internal Medicine

## 2016-02-17 ENCOUNTER — Encounter (INDEPENDENT_AMBULATORY_CARE_PROVIDER_SITE_OTHER): Payer: Self-pay | Admitting: Internal Medicine

## 2016-02-17 ENCOUNTER — Ambulatory Visit (INDEPENDENT_AMBULATORY_CARE_PROVIDER_SITE_OTHER): Payer: PPO | Admitting: Internal Medicine

## 2016-02-17 ENCOUNTER — Encounter (INDEPENDENT_AMBULATORY_CARE_PROVIDER_SITE_OTHER): Payer: Self-pay | Admitting: *Deleted

## 2016-02-17 VITALS — BP 100/62 | HR 57 | Temp 98.3°F | Resp 16 | Ht 60.0 in | Wt 117.2 lb

## 2016-02-17 DIAGNOSIS — R1319 Other dysphagia: Secondary | ICD-10-CM

## 2016-02-17 NOTE — Progress Notes (Signed)
Subjective:    Patient ID: Heather Gilmore, female    DOB: [birth year], 82 y.o.   MRN: [id number]  HPI Referred by Dr. Sallee Lange for dysphagia. Symptoms for years. Cornbread bother her as well as meats. She has had to cough the bolus back up.  No problem with creamed potatoes. She also has trouble swallowing pills.  Her appetite has remained good. In the past 6 months she may have lost 10 pounds. There is no abdominal pain.    She usually has a BM daily. She says her acid reflux is controlled with Protonix.  Hx of dysphagia in the past and has undergone several EGD/ED in the past.      11/30/2005: Esophagogastroduodenoscopy with [facility] dilation, followed by diagnostic colonoscopy. Esophagogastroduodenoscopy: 1. Prominent Schatzki's ring, otherwise normal esophagus. 2. Small hiatal hernia, otherwise normal stomach, D1, D2. 3. Status post dilation as described above.  Colonoscopy findings: 1. Friable hemorrhoids, otherwise normal rectum. 2. Extensive left-sided and transverse diverticula. The remainder of the  colonic mucosa appeared normal.    Review of Systems Past Medical History  Diagnosis Date  . Hypertension   . Parkinson disease ([place])   . Arthritis   . 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 Asthma   . Thyroid nodule   . Hyperlipidemia   . IBS (irritable bowel syndrome)   . Essential tremor   . Rosacea     Past Surgical History  Procedure Laterality Date  . Appendectomy    . Cholecystectomy  6 yrs ago    aph-Dr Arnoldo Morale  . Tonsillectomy      as child  . Cataract extraction w/phaco  05/03/2011    Procedure: CATARACT EXTRACTION PHACO AND INTRAOCULAR LENS PLACEMENT (IOC);  Surgeon: Elta Guadeloupe T. Gershon Crane;  Location: [facility];  Service: Ophthalmology;  Laterality: Right;  CDE: 7.52  . Cataract extraction w/phaco  05/17/2011    Procedure: CATARACT EXTRACTION PHACO AND INTRAOCULAR LENS PLACEMENT (IOC);  Surgeon: Elta Guadeloupe T. Gershon Crane;  Location: [facility];  Service: Ophthalmology;  Laterality: Left;  CDE 9.49    Allergies  Allergen Reactions  . Dyazide [Hydrochlorothiazide W-Triamterene]   . Penicillins Other (See Comments)    Reaction: head felt on fire.    Current Outpatient Prescriptions on File Prior to Visit  Medication Sig Dispense Refill  . acetaminophen (TYLENOL) 500 MG tablet Take 500 mg by mouth every 6 (six) hours as needed. For pain     . albuterol (PROVENTIL HFA;VENTOLIN HFA) 108 (90 Base) MCG/ACT inhaler Please dispense two if possible. For shortness of breath 6.7 g 2  . carbidopa-levodopa (SINEMET IR) 25-100 MG tablet TAKE (1) TABLET BY MOUTH (4) TIMES DAILY. 360 tablet 0  . lisinopril (PRINIVIL,ZESTRIL) 10 MG tablet Take 1 tablet (10 mg total) by mouth daily. 30 tablet 5  . oxybutynin (DITROPAN-XL) 5 MG 24 hr tablet Take 1 tablet (5 mg total) by mouth daily. 30 tablet 2  . pantoprazole (PROTONIX) 40 MG tablet Take 1 tablet (40 mg total) by mouth daily. 30 tablet 5  . Vitamin D, Ergocalciferol, (DRISDOL) 50000 UNITS CAPS capsule Take 50,000 Units by mouth every 7 (seven) days.     Current Facility-Administered Medications on File Prior to Visit  Medication Dose Route Frequency Provider Last Rate Last Dose  . methylPREDNISolone acetate (DEPO-MEDROL) injection 40 mg  40 mg Intra-articular Once Mikey Kirschner, MD            Objective:   Physical ExamBlood pressure  100/62, pulse 57, resp. rate 16, height 5' (1.524 m), weight 117 lb 4 oz (53.184 kg), SpO2 96 %. Alert and oriented. Skin warm and dry. Oral mucosa is moist.   . Sclera anicteric, conjunctivae is pink. Thyroid not enlarged. No cervical lymphadenopathy. Lungs clear. Heart regular rate and rhythm.  Abdomen is soft. Bowel sounds are positive. No hepatomegaly. No abdominal masses felt. No tenderness.  No edema to lower extremities. Patient is alert and oriented.        Assessment & Plan:  Solid food dysphagia. Esophageal stricture/ring needs to be ruled  out. EGD/ED.

## 2016-02-17 NOTE — Patient Instructions (Signed)
EGd/ED. The risks and benefits such as perforation, bleeding, and infection were reviewed with the patient and is agreeable.

## 2016-03-01 ENCOUNTER — Ambulatory Visit (HOSPITAL_COMMUNITY): Payer: PPO | Admitting: Physical Therapy

## 2016-03-01 DIAGNOSIS — M25551 Pain in right hip: Secondary | ICD-10-CM

## 2016-03-01 DIAGNOSIS — M6281 Muscle weakness (generalized): Secondary | ICD-10-CM

## 2016-03-01 DIAGNOSIS — R262 Difficulty in walking, not elsewhere classified: Secondary | ICD-10-CM | POA: Diagnosis not present

## 2016-03-01 DIAGNOSIS — R2681 Unsteadiness on feet: Secondary | ICD-10-CM

## 2016-03-01 DIAGNOSIS — R29898 Other symptoms and signs involving the musculoskeletal system: Secondary | ICD-10-CM

## 2016-03-01 NOTE — Therapy (Signed)
[place], [place], [address] Phone: [phone]   Fax:  [phone]  Physical Therapy Treatment  Patient Details  Name: Heather Gilmore MRN: [id number] Date of Birth: April 01, 1930 Referring Provider: Teresa Coombs   Encounter Date: 03/01/2016      PT End of Session - 03/01/16 1658    Visit Number 2   Number of Visits 16   Date for PT Re-Evaluation 03/15/16   Authorization Type [employer]    Authorization Time Period 02/16/16 to 04/18/16   Authorization - Visit Number 2   Authorization - Number of Visits 10   PT Start Time [id number]   PT Stop Time 1429   PT Time Calculation (min) 40 min   Activity Tolerance Patient tolerated treatment well   Behavior During Therapy [facility] for tasks assessed/performed      Past Medical History:  Diagnosis Date  . Arthritis   . Asthma   . Essential tremor   . 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 Hyperlipidemia   . Hypertension   . IBS (irritable bowel syndrome)   . Parkinson disease ([place])   . Rosacea   . Thyroid nodule     Past Surgical History:  Procedure Laterality Date  . APPENDECTOMY    . CATARACT EXTRACTION W/PHACO  05/03/2011   Procedure: CATARACT EXTRACTION PHACO AND INTRAOCULAR LENS PLACEMENT (IOC);  Surgeon: Elta Guadeloupe T. Gershon Crane;  Location: [facility];  Service: Ophthalmology;  Laterality: Right;  CDE: 7.52  . CATARACT EXTRACTION W/PHACO  05/17/2011   Procedure: CATARACT EXTRACTION PHACO AND INTRAOCULAR LENS PLACEMENT (IOC);  Surgeon: Elta Guadeloupe T. Gershon Crane;  Location: [facility];  Service: Ophthalmology;  Laterality: Left;  CDE 9.49  . CHOLECYSTECTOMY  6 yrs ago   aph-Dr Arnoldo Morale  . TONSILLECTOMY     as child    There were no vitals filed for this visit.      Subjective Assessment - 03/01/16 1351    Subjective Patient arrives today stating she is doing well, no pain today and is feeling well. She has been having a lot of close calls with falls however.     Pertinent History HTN, history of syncope, asthma, IBS    Currently in Pain? No/denies                         [facility] Adult PT Treatment/Exercise - 03/01/16 0001      Ambulation/Gait   Gait Comments gait with juggling scarf toss, cues for step length and form     Knee/Hip Exercises: Aerobic   Tread Mill x4 minutes at [facility], cues for posture and step lengths, heel toe            PWR ([facility]) - 03/01/16 1400    PWR! Up --   PWR! [place]! Twist --   PWR Step --   PWR! Sit to Stand 1x10 with scarf toss    PWR! Up 1x10 seated   PWR! Rock 1x10 seated   PWR! Twist 1x10 seated   PWR! Step 1x10 seated   Comments mod cues for form              PT Education - 03/01/16 1657    Education provided Yes   Education Details importance of PWR training and physical activity, functional strength in managing OA; reviewed initial eval and goals    Person(s) Educated Patient   Methods Explanation  Comprehension Verbalized understanding          PT Short Term Goals - 02/16/16 1216      PT SHORT TERM GOAL #1   Title Patient will demonstrate improved gait mechanics in forward and backwards directions, including improved posture, step lengths, foot clearance and no drifting to either side in order to improve mobilty and reduce fall risk   Time 4   Period Weeks   Status New     PT SHORT TERM GOAL #2   Title Patient willl be able to tolerate at least 15 minutes of weight bearing with no increased pain in order to improve function within the community and at home    Time 4   Period Weeks   Status New     PT SHORT TERM GOAL #3   Title Patient will demonstrate improved posture during all functional situations in order to reduce fall risk and assist in improving overall mobility    Time 4   Period Weeks   Status New     PT SHORT TERM GOAL #4   Title Patient will experience no more than 1/10 pain in her R hip in order to demonstrate overall improvement of  condition and improve QOL    Time 4   Period Weeks   Status New     PT SHORT TERM GOAL #5   Title Patient will be independent in correctly and consistently performing appropriate HEP, to be updated PRN    Time 4   Period Weeks   Status New           PT Long Term Goals - 02/16/16 1219      PT LONG TERM GOAL #1   Title Patient will demonstrate strength 5/5 in all tested muscle groups in order to reduce pain and improve function    Time 8   Period Weeks   Status New     PT LONG TERM GOAL #2   Title Patient will score at least 51 on BERG balance test in order to show improved balance/reduced fall risk    Time 8   Period Weeks   Status New     PT LONG TERM GOAL #3   Title Patient to be able to ambulate at least 647ft during 3MWT in order to demonstrate improved mobilty for home and community ambulation    Time 8   Period Weeks   Status New     PT LONG TERM GOAL #4   Title Patient to be participatory in regular exercise at senior center, at least 3 days per week, in order to improve QOL and assist in maintaining regular exercise habit    Time 8   Period Weeks   Status New     PT LONG TERM GOAL #5   Title Patient to be able to tolerate weight bearing at least 30-45 minutes with no exacerbation of pain in order to improve function and QOL    Time 8   Period Weeks   Status New               Plan - 03/01/16 1659    Clinical Impression Statement Began session with PWR training in sitting with mod cues for form and correct technique; also performed other functional PWR based exercises today such as sit to stand and gait with juggling scarf to work on multitasking and gait mechanics during dynamic situation. Trialed treadmill at [facility] with 0% elevation however patient has difficulty with correct step lengths  and heel-toe on dynamic surface, even more so than on static surface. Educated patient regarding relevance of PWR based activities as well as functional strength for  managing OA, also reviewed initial eval and goals at end of session.    Rehab Potential Good   PT Frequency 2x / week   PT Duration 8 weeks   PT Treatment/Interventions ADLs/Self Care Home Management;Cryotherapy;Moist Heat;Gait training;Stair training;Functional mobility training;Therapeutic activities;Therapeutic exercise;Balance training;Neuromuscular re-education;Patient/family education;Manual techniques;Passive range of motion;Energy conservation;Taping   PT Next Visit Plan PWR training, gait/balance training. Also work on functional strengthening for LEs and hips.    PT Home Exercise Plan will give next session    Consulted and Agree with Plan of Care Patient      Patient will benefit from skilled therapeutic intervention in order to improve the following deficits and impairments:  Abnormal gait, Hypomobility, Decreased activity tolerance, Decreased strength, Pain, Decreased balance, Decreased mobility, Difficulty walking, Improper body mechanics, Decreased coordination, Impaired flexibility, Postural dysfunction  Visit Diagnosis: Difficulty in walking, not elsewhere classified  Unsteadiness on feet  Pain in right hip  Muscle weakness (generalized)  Other symptoms and signs involving the musculoskeletal system     Problem List Patient Active Problem List   Diagnosis Date Noted  . Urge incontinence 12/19/2013  . 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 Parkinson's disease ([place]) 05/28/2013  . Essential hypertension 06/09/2009  . Asthma 06/09/2009  . GERD 06/09/2009  . SYNCOPE 06/09/2009  . Abnormal involuntary movement 06/09/2009    Deniece Ree PT, DPT [facility] [address] [place], [place], [address] Phone: [phone]   Fax:  [phone]  Name: Heather Gilmore MRN: [id number] Date of Birth: 1929/12/06

## 2016-03-02 ENCOUNTER — Ambulatory Visit (HOSPITAL_COMMUNITY)
Admission: RE | Admit: 2016-03-02 | Discharge: 2016-03-02 | Disposition: A | Discharge status: Home / Self Care | Payer: PPO | Source: Ambulatory Visit | Attending: Internal Medicine | Admitting: Internal Medicine

## 2016-03-02 ENCOUNTER — Encounter (HOSPITAL_COMMUNITY): Payer: Self-pay | Admitting: *Deleted

## 2016-03-02 ENCOUNTER — Encounter (HOSPITAL_COMMUNITY)
Admission: RE | Disposition: A | Discharge status: Home / Self Care | Payer: Self-pay | Source: Ambulatory Visit | Attending: Internal Medicine

## 2016-03-02 DIAGNOSIS — Z79899 Other long term (current) drug therapy: Secondary | ICD-10-CM | POA: Diagnosis not present

## 2016-03-02 DIAGNOSIS — K449 Diaphragmatic hernia without obstruction or gangrene: Secondary | ICD-10-CM | POA: Diagnosis not present

## 2016-03-02 DIAGNOSIS — K228 Other specified diseases of esophagus: Secondary | ICD-10-CM | POA: Diagnosis not present

## 2016-03-02 DIAGNOSIS — R1310 Dysphagia, unspecified: Secondary | ICD-10-CM | POA: Diagnosis not present

## 2016-03-02 DIAGNOSIS — Z87891 Personal history of nicotine dependence: Secondary | ICD-10-CM | POA: Insufficient documentation

## 2016-03-02 DIAGNOSIS — I10 Essential (primary) hypertension: Secondary | ICD-10-CM | POA: Insufficient documentation

## 2016-03-02 DIAGNOSIS — R1319 Other dysphagia: Secondary | ICD-10-CM

## 2016-03-02 DIAGNOSIS — K222 Esophageal obstruction: Secondary | ICD-10-CM | POA: Diagnosis not present

## 2016-03-02 DIAGNOSIS — G20 Parkinson's disease: Secondary | ICD-10-CM | POA: Insufficient documentation

## 2016-03-02 DIAGNOSIS — J45909 Unspecified asthma, uncomplicated: Secondary | ICD-10-CM | POA: Diagnosis not present

## 2016-03-02 DIAGNOSIS — R1314 Dysphagia, pharyngoesophageal phase: Secondary | ICD-10-CM | POA: Diagnosis not present

## 2016-03-02 DIAGNOSIS — K210 Gastro-esophageal reflux disease with esophagitis: Secondary | ICD-10-CM | POA: Diagnosis not present

## 2016-03-02 HISTORY — PX: ESOPHAGEAL DILATION: SHX303

## 2016-03-02 HISTORY — PX: ESOPHAGOGASTRODUODENOSCOPY: SHX5428

## 2016-03-02 SURGERY — EGD (ESOPHAGOGASTRODUODENOSCOPY)
Anesthesia: Moderate Sedation

## 2016-03-02 MED ORDER — MIDAZOLAM HCL 5 MG/5ML IJ SOLN
INTRAMUSCULAR | Status: AC
  Filled 2016-03-02: qty 10

## 2016-03-02 MED ORDER — MEPERIDINE HCL 50 MG/ML IJ SOLN
INTRAMUSCULAR | Status: AC
  Filled 2016-03-02: qty 1

## 2016-03-02 MED ORDER — MIDAZOLAM HCL 5 MG/5ML IJ SOLN
INTRAMUSCULAR | Status: DC | PRN
  Administered 2016-03-02 (×3): 1 mg via INTRAVENOUS

## 2016-03-02 MED ORDER — SODIUM CHLORIDE 0.9 % IV SOLN
INTRAVENOUS | Status: DC
  Administered 2016-03-02: 1000 mL via INTRAVENOUS

## 2016-03-02 MED ORDER — SIMETHICONE 40 MG/0.6ML PO SUSP
ORAL | Status: DC | PRN
  Administered 2016-03-02: 11:00:00

## 2016-03-02 MED ORDER — BUTAMBEN-TETRACAINE-BENZOCAINE 2-2-14 % EX AERO
INHALATION_SPRAY | CUTANEOUS | Status: DC | PRN
  Administered 2016-03-02: 2 via TOPICAL

## 2016-03-02 MED ORDER — BUTAMBEN-TETRACAINE-BENZOCAINE 2-2-14 % EX AERO
INHALATION_SPRAY | CUTANEOUS | Status: AC
Start: 2016-03-02 — End: 2016-03-02
  Filled 2016-03-02: qty 20

## 2016-03-02 MED ORDER — MEPERIDINE HCL 50 MG/ML IJ SOLN
INTRAMUSCULAR | Status: DC | PRN
  Administered 2016-03-02: 25 mg via INTRAVENOUS

## 2016-03-02 NOTE — Op Note (Signed)
[facility] Patient Name: Barbara Vea Procedure Date: 03/02/2016 10:39 AM MRN: [id number] Date of Birth: 06/23/1930 Attending MD: Hildred Laser , MD CSN: [id number] Age: 82 Admit Type: Outpatient Procedure:                Upper GI endoscopy Indications:              Esophageal dysphagia, Reflux esophagitis Providers:                Hildred Laser, MD, Otis Peak B. Sharon Seller, RN, Shelby Mattocks, Technician Referring MD:             Elayne Snare Wolfgang Phoenix, MD Medicines:                Cetacaine spray, Meperidine 25 mg IV, Midazolam 3                            mg IV Complications:            No immediate complications. Estimated Blood Loss:     Estimated blood loss was minimal. Procedure:                Pre-Anesthesia Assessment:                           - Prior to the procedure, a History and Physical                            was performed, and patient medications and                            allergies were reviewed. The patient's tolerance of                            previous anesthesia was also reviewed. The risks                            and benefits of the procedure and the sedation                            options and risks were discussed with the patient.                            All questions were answered, and informed consent                            was obtained. Prior Anticoagulants: The patient has                            taken no previous anticoagulant or antiplatelet                            agents. ASA Grade Assessment: III - A patient with  severe systemic disease. After reviewing the risks                            and benefits, the patient was deemed in                            satisfactory condition to undergo the procedure.                           After obtaining informed consent, the endoscope was                            passed under direct vision. Throughout the   procedure, the patient's blood pressure, pulse, and                            oxygen saturations were monitored continuously. The                            [id number]) scope was introduced through the                            mouth, and advanced to the second part of duodenum.                            The upper GI endoscopy was accomplished without                            difficulty. The patient tolerated the procedure                            well. Scope In: 10:55:18 AM Scope Out: 11:04:24 AM Total Procedure Duration: 0 hours 9 minutes 6 seconds  Findings:      Diffuse mild mucosal changes characterized by granularity, longitudinal       markings and white specks were found in the middle third of the       esophagus and in the lower third of the esophagus. Biopsies were taken       with a cold forceps for histology.      One severe benign-appearing, intrinsic stenosis was found 35 cm from the       incisors. This measured 1 cm (inner diameter) x less than one cm (in       length) and was traversed. A TTS dilator was passed through the scope.       Dilation with a 15-16.5-18 mm balloon dilator was performed to 15 mm and       16.5 mm. The dilation site was examined and showed moderate improvement       in luminal narrowing.      A 4 cm hiatal hernia was present.      The entire examined stomach was normal.      The duodenal bulb and second portion of the duodenum were normal. Impression:               - Granular, longitudinally marked, white specked  mucosa in the esophagus. Biopsied to rule out                            eosinophilic esophagitis.                           - Benign-appearing esophageal stenosis. Dilated.                           - 4 cm hiatal hernia.                           - Normal stomach.                           - Normal duodenal bulb and second portion of the                            duodenum. Moderate Sedation:       Moderate (conscious) sedation was administered by the endoscopy nurse       and supervised by the endoscopist. The following parameters were       monitored: oxygen saturation, heart rate, blood pressure, CO2       capnography and response to care. Total physician intraservice time was       15 minutes. Recommendation:           - Patient has a contact number available for                            emergencies. The signs and symptoms of potential                            delayed complications were discussed with the                            patient. Return to normal activities tomorrow.                            Written discharge instructions were provided to the                            patient.                           - Resume previous diet today.                           - Continue present medications.                           - No aspirin, ibuprofen, naproxen, or other                            non-steroidal anti-inflammatory drugs for 3 days.                           - Await pathology results.                           -  Return to GI clinic in 2 months. Procedure Code(s):        --- Professional ---                           [phone], Esophagogastroduodenoscopy, flexible,                            transoral; with transendoscopic balloon dilation of                            esophagus (less than 30 mm diameter)                           43239, Esophagogastroduodenoscopy, flexible,                            transoral; with biopsy, single or multiple                           99152, Moderate sedation services provided by the                            same physician or other qualified health care                            professional performing the diagnostic or                            therapeutic service that the sedation supports,                            requiring the presence of an independent trained                            observer to assist in the  monitoring of the                            patient's level of consciousness and physiological                            status; initial 15 minutes of intraservice time,                            patient age 75 years or older Diagnosis Code(s):        --- Professional ---                           K22.8, Other specified diseases of esophagus                           K22.2, Esophageal obstruction                           K44.9, Diaphragmatic hernia without obstruction or  gangrene                           R13.14, Dysphagia, pharyngoesophageal phase                           K21.0, Gastro-esophageal reflux disease with                            esophagitis CPT copyright 2016 American Medical Association. All rights reserved. The codes documented in this report are preliminary and upon coder review may  be revised to meet current compliance requirements. Hildred Laser, MD Hildred Laser, MD 03/02/2016 11:19:47 AM This report has been signed electronically. Number of Addenda: 0

## 2016-03-02 NOTE — H&P (Signed)
Heather Gilmore is an 82 y.o. female.   Chief Complaint: She is here for EGD and ED. HPI: Patient is 82 year old Caucasian female with multiple medical problems including Parkinson's disease who presents with solid food dysphagia. She had an EGD back in April 2007 and noted to have Schatzki's ring. It was dilated. She states she's had solid food dysphagia for several months. She feels that dilation over 10 years ago did not help much. She is been having intermittent episodes of for impaction relieved with regurgitation but has not had an episode in 2 weeks. She denies heartburn. She says she has lost some weight but that was secondary to loss of fluid from her lower extremities.  Past Medical History:  Diagnosis Date  . Arthritis   . Asthma   . Essential tremor   . 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 Hyperlipidemia   . Hypertension   . IBS (irritable bowel syndrome)   . Parkinson disease ([place])   . Rosacea   . Thyroid nodule     Past Surgical History:  Procedure Laterality Date  . APPENDECTOMY    . CATARACT EXTRACTION W/PHACO  05/03/2011   Procedure: CATARACT EXTRACTION PHACO AND INTRAOCULAR LENS PLACEMENT (IOC);  Surgeon: Elta Guadeloupe T. Gershon Crane;  Location: [facility];  Service: Ophthalmology;  Laterality: Right;  CDE: 7.52  . CATARACT EXTRACTION W/PHACO  05/17/2011   Procedure: CATARACT EXTRACTION PHACO AND INTRAOCULAR LENS PLACEMENT (IOC);  Surgeon: Elta Guadeloupe T. Gershon Crane;  Location: [facility];  Service: Ophthalmology;  Laterality: Left;  CDE 9.49  . CHOLECYSTECTOMY  6 yrs ago   aph-Dr Arnoldo Morale  . TONSILLECTOMY     as child    Family History  Problem Relation Age of Onset  . Anesthesia problems Neg Hx   . Hypotension Neg Hx   . Malignant hyperthermia Neg Hx   . Pseudochol deficiency Neg Hx    Social History:  reports that she quit smoking about 24 years ago. Her smoking use included Cigarettes. She has a 10.00 pack-year smoking history. She has never used smokeless tobacco. She reports that  she drinks alcohol. She reports that she does not use drugs.  Allergies:  Allergies  Allergen Reactions  . Dyazide [Hydrochlorothiazide W-Triamterene]   . Penicillins Other (See Comments)    Reaction: head felt on fire.    Facility-Administered Medications Prior to Admission  Medication Dose Route Frequency Provider Last Rate Last Dose  . methylPREDNISolone acetate (DEPO-MEDROL) injection 40 mg  40 mg Intra-articular Once Mikey Kirschner, MD       Medications Prior to Admission  Medication Sig Dispense Refill  . carbidopa-levodopa (SINEMET IR) 25-100 MG tablet TAKE (1) TABLET BY MOUTH (4) TIMES DAILY. 360 tablet 0  . lisinopril (PRINIVIL,ZESTRIL) 10 MG tablet Take 1 tablet (10 mg total) by mouth daily. 30 tablet 5  . oxybutynin (DITROPAN-XL) 5 MG 24 hr tablet Take 1 tablet (5 mg total) by mouth daily. 30 tablet 2  . Vitamin D, Ergocalciferol, (DRISDOL) 50000 UNITS CAPS capsule Take 50,000 Units by mouth every 7 (seven) days.    Marland Kitchen acetaminophen (TYLENOL) 500 MG tablet Take 500 mg by mouth every 6 (six) hours as needed. For pain     . albuterol (PROVENTIL HFA;VENTOLIN HFA) 108 (90 Base) MCG/ACT inhaler Please dispense two if possible. For shortness of breath 6.7 g 2  . pantoprazole (PROTONIX) 40 MG tablet Take 1 tablet (40 mg total) by mouth daily. 30 tablet 5    No results  found for this or any previous visit (from the past 48 hour(s)). No results found.  ROS  Blood pressure (!) 131/96, pulse 63, temperature 97.6 F (36.4 C), temperature source Oral, resp. rate 16, height 5' (1.524 m), weight 117 lb (53.1 kg), SpO2 97 %. Physical Exam  Constitutional: She appears well-developed and well-nourished.  HENT:  Mouth/Throat: Oropharynx is clear and moist.  Eyes: Conjunctivae are normal. No scleral icterus.  Neck: No thyromegaly present.  Cardiovascular: Normal rate, regular rhythm and normal heart sounds.   No murmur heard. Respiratory: Effort normal and breath sounds normal.  GI:   Abdomen is soft and nontender without organomegaly or masses.  Musculoskeletal: She exhibits edema (1+ pitting edema around .).  Lymphadenopathy:    She has no cervical adenopathy.  Neurological: She is alert.  She has head tremor.  Skin: Skin is warm.     Assessment/Plan Solid food dysphagia. History of Schatzki's ring. EGD with ED.  Hildred Laser, MD 03/02/2016, 10:43 AM

## 2016-03-02 NOTE — Discharge Instructions (Signed)
No aspirin or NSAIDs for 3 days. Resume usual medications and diet. No driving for 24 hours. Physician will call with biopsy results.  Gastrointestinal Endoscopy, Care After Refer to this sheet in the next few weeks. These instructions provide you with information on caring for yourself after your procedure. Your caregiver may also give you more specific instructions. Your treatment has been planned according to current medical practices, but problems sometimes occur. Call your caregiver if you have any problems or questions after your procedure. HOME CARE INSTRUCTIONS  If you were given medicine to help you relax (sedative), do not drive, operate machinery, or sign important documents for 24 hours.  Avoid alcohol and hot or warm beverages for the first 24 hours after the procedure.  Only take over-the-counter or prescription medicines for pain, discomfort, or fever as directed by your caregiver. You may resume taking your normal medicines unless your caregiver tells you otherwise. Ask your caregiver when you may resume taking medicines that may cause bleeding, such as aspirin, clopidogrel, or warfarin.  You may return to your normal diet and activities on the day after your procedure, or as directed by your caregiver. Walking may help to reduce any bloated feeling in your abdomen.  Drink enough fluids to keep your urine clear or pale yellow.  You may gargle with salt water if you have a sore throat. SEEK IMMEDIATE MEDICAL CARE IF:  You have severe nausea or vomiting.  You have severe abdominal pain, abdominal cramps that last longer than 6 hours, or abdominal swelling (distention).  You have severe shoulder or back pain.  You have trouble swallowing.  You have shortness of breath, your breathing is shallow, or you are breathing faster than normal.  You have a fever or a rapid heartbeat.  You vomit blood or material that looks like coffee grounds.  You have bloody, black, or tarry  stools. MAKE SURE YOU:  Understand these instructions.  Will watch your condition.  Will get help right away if you are not doing well or get worse.   This information is not intended to replace advice given to you by your health care provider. Make sure you discuss any questions you have with your health care provider.

## 2016-03-03 ENCOUNTER — Encounter (INDEPENDENT_AMBULATORY_CARE_PROVIDER_SITE_OTHER): Payer: Self-pay | Admitting: *Deleted

## 2016-03-04 ENCOUNTER — Encounter (HOSPITAL_COMMUNITY): Payer: Self-pay | Admitting: Internal Medicine

## 2016-03-08 ENCOUNTER — Ambulatory Visit (HOSPITAL_COMMUNITY): Payer: PPO | Attending: Sports Medicine | Admitting: Physical Therapy

## 2016-03-08 DIAGNOSIS — R262 Difficulty in walking, not elsewhere classified: Secondary | ICD-10-CM | POA: Diagnosis not present

## 2016-03-08 DIAGNOSIS — M25551 Pain in right hip: Secondary | ICD-10-CM | POA: Insufficient documentation

## 2016-03-08 DIAGNOSIS — R29898 Other symptoms and signs involving the musculoskeletal system: Secondary | ICD-10-CM | POA: Diagnosis not present

## 2016-03-08 DIAGNOSIS — M6281 Muscle weakness (generalized): Secondary | ICD-10-CM | POA: Insufficient documentation

## 2016-03-08 DIAGNOSIS — R2681 Unsteadiness on feet: Secondary | ICD-10-CM | POA: Diagnosis not present

## 2016-03-08 NOTE — Therapy (Signed)
[place], [place], [address] Phone: [phone]   Fax:  [phone]  Physical Therapy Treatment  Patient Details  Name: Heather Gilmore MRN: [id number] Date of Birth: August 25, 1929 Referring Provider: Teresa Coombs   Encounter Date: 03/08/2016      PT End of Session - 03/08/16 1805    Visit Number 3   Number of Visits 16   Date for PT Re-Evaluation 03/15/16   Authorization Type [employer]    Authorization Time Period 02/16/16 to 04/18/16   Authorization - Visit Number 3   Authorization - Number of Visits 10   PT Start Time [id number]  patient arrived a few minutes late/was checking in    PT Stop Time 1558   PT Time Calculation (min) 35 min   Activity Tolerance Patient tolerated treatment well;Patient limited by fatigue   Behavior During Therapy [facility] for tasks assessed/performed      Past Medical History:  Diagnosis Date  . Arthritis   . Asthma   . Essential tremor   . 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 Hyperlipidemia   . Hypertension   . IBS (irritable bowel syndrome)   . Parkinson disease ([place])   . Rosacea   . Thyroid nodule     Past Surgical History:  Procedure Laterality Date  . APPENDECTOMY    . CATARACT EXTRACTION W/PHACO  05/03/2011   Procedure: CATARACT EXTRACTION PHACO AND INTRAOCULAR LENS PLACEMENT (IOC);  Surgeon: Elta Guadeloupe T. Gershon Crane;  Location: [facility];  Service: Ophthalmology;  Laterality: Right;  CDE: 7.52  . CATARACT EXTRACTION W/PHACO  05/17/2011   Procedure: CATARACT EXTRACTION PHACO AND INTRAOCULAR LENS PLACEMENT (IOC);  Surgeon: Elta Guadeloupe T. Gershon Crane;  Location: [facility];  Service: Ophthalmology;  Laterality: Left;  CDE 9.49  . CHOLECYSTECTOMY  6 yrs ago   aph-Dr Arnoldo Morale  . ESOPHAGEAL DILATION N/A 03/02/2016   Procedure: ESOPHAGEAL DILATION;  Surgeon: Rogene Houston, MD;  Location[facility];  Service: Endoscopy;  Laterality: N/A;  . ESOPHAGOGASTRODUODENOSCOPY N/A 03/02/2016    Procedure: ESOPHAGOGASTRODUODENOSCOPY (EGD);  Surgeon: Rogene Houston, MD;  [facility];  Service: Endoscopy;  Laterality: N/A;  1:25  . TONSILLECTOMY     as child    There were no vitals filed for this visit.      Subjective Assessment - 03/08/16 1539    Subjective Patient arrives a few minutes late today, doing well and no close calls or falls recently.    Pertinent History HTN, history of syncope, asthma, IBS       Pain 0/10 today                    [facility] Adult PT Treatment/Exercise - 03/08/16 0001      Ambulation/Gait   Gait Comments gait training large heel-toe steps            PWR [facility]) - 03/08/16 1539    Comments attempted on table but patient unable to tolerate classic quadruped position    PWR! Up 1x10   PWR! Rock 2x5   PWR! Step 1x10   PWR! Up 1x10 seated   PWR! Rock  2x5 seated   PWR! Twist 2x5 seated    PWR! Step 1x10 seated              PT Education - 03/08/16 1805    Education provided No          PT Short Term  Goals - 02/16/16 1216      PT SHORT TERM GOAL #1   Title Patient will demonstrate improved gait mechanics in forward and backwards directions, including improved posture, step lengths, foot clearance and no drifting to either side in order to improve mobilty and reduce fall risk   Time 4   Period Weeks   Status New     PT SHORT TERM GOAL #2   Title Patient willl be able to tolerate at least 15 minutes of weight bearing with no increased pain in order to improve function within the community and at home    Time 4   Period Weeks   Status New     PT SHORT TERM GOAL #3   Title Patient will demonstrate improved posture during all functional situations in order to reduce fall risk and assist in improving overall mobility    Time 4   Period Weeks   Status New     PT SHORT TERM GOAL #4   Title Patient will experience no more than 1/10 pain in her R hip in order to demonstrate overall improvement of  condition and improve QOL    Time 4   Period Weeks   Status New     PT SHORT TERM GOAL #5   Title Patient will be independent in correctly and consistently performing appropriate HEP, to be updated PRN    Time 4   Period Weeks   Status New           PT Long Term Goals - 02/16/16 1219      PT LONG TERM GOAL #1   Title Patient will demonstrate strength 5/5 in all tested muscle groups in order to reduce pain and improve function    Time 8   Period Weeks   Status New     PT LONG TERM GOAL #2   Title Patient will score at least 51 on BERG balance test in order to show improved balance/reduced fall risk    Time 8   Period Weeks   Status New     PT LONG TERM GOAL #3   Title Patient to be able to ambulate at least 618ft during 3MWT in order to demonstrate improved mobilty for home and community ambulation    Time 8   Period Weeks   Status New     PT LONG TERM GOAL #4   Title Patient to be participatory in regular exercise at senior center, at least 3 days per week, in order to improve QOL and assist in maintaining regular exercise habit    Time 8   Period Weeks   Status New     PT LONG TERM GOAL #5   Title Patient to be able to tolerate weight bearing at least 30-45 minutes with no exacerbation of pain in order to improve function and QOL    Time 8   Period Weeks   Status New               Plan - 03/08/16 1806    Clinical Impression Statement Continued with basic PWR exercises today with some modifications due to patient feelings of malaise with trunk flexion/bending motion; min-mod cues for form throughout session today, and rest breaks provided PRN. Attempted classic quadruped position but patient unable to tolerate.  Ended session with gait training forward with cues for heel-toe steps and long step lengths. Noted ongoing difficulty with rotation and lateral shifting based tasks today.    Rehab Potential Good  PT Frequency 2x / week   PT Duration 8 weeks   PT  Treatment/Interventions ADLs/Self Care Home Management;Cryotherapy;Moist Heat;Gait training;Stair training;Functional mobility training;Therapeutic activities;Therapeutic exercise;Balance training;Neuromuscular re-education;Patient/family education;Manual techniques;Passive range of motion;Energy conservation;Taping   PT Next Visit Plan PWR training, gait/balance training. Also work on functional strengthening for LEs and hips.    Consulted and Agree with Plan of Care Patient      Patient will benefit from skilled therapeutic intervention in order to improve the following deficits and impairments:  Abnormal gait, Hypomobility, Decreased activity tolerance, Decreased strength, Pain, Decreased balance, Decreased mobility, Difficulty walking, Improper body mechanics, Decreased coordination, Impaired flexibility, Postural dysfunction  Visit Diagnosis: Difficulty in walking, not elsewhere classified  Unsteadiness on feet  Pain in right hip  Muscle weakness (generalized)  Other symptoms and signs involving the musculoskeletal system     Problem List Patient Active Problem List   Diagnosis Date Noted  . Urge incontinence 12/19/2013  . 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 Parkinson's disease ([place]) 05/28/2013  . Essential hypertension 06/09/2009  . Asthma 06/09/2009  . GERD 06/09/2009  . SYNCOPE 06/09/2009  . Abnormal involuntary movement 06/09/2009    Deniece Ree PT, DPT [facility] [address] [place], [place], [address] Phone: [phone]   Fax:  [phone]  Name: Heather Gilmore MRN: [id number] Date of Birth: [birth year]

## 2016-03-10 ENCOUNTER — Ambulatory Visit (HOSPITAL_COMMUNITY): Payer: PPO | Admitting: Physical Therapy

## 2016-03-10 DIAGNOSIS — R262 Difficulty in walking, not elsewhere classified: Secondary | ICD-10-CM

## 2016-03-10 DIAGNOSIS — R29898 Other symptoms and signs involving the musculoskeletal system: Secondary | ICD-10-CM

## 2016-03-10 DIAGNOSIS — M6281 Muscle weakness (generalized): Secondary | ICD-10-CM

## 2016-03-10 DIAGNOSIS — R2681 Unsteadiness on feet: Secondary | ICD-10-CM

## 2016-03-10 DIAGNOSIS — M25551 Pain in right hip: Secondary | ICD-10-CM

## 2016-03-10 NOTE — Therapy (Signed)
[place], [place], [address] Phone: [phone]   Fax:  [phone]  Physical Therapy Treatment  Patient Details  Name: Heather Gilmore MRN: [id number] Date of Birth: [birth year] Referring Provider: Teresa Coombs   Encounter Date: 03/10/2016      PT End of Session - 03/10/16 1608    Visit Number 4   Number of Visits 16   Date for PT Re-Evaluation 03/15/16   Authorization Type [employer]    Authorization Time Period 02/16/16 to 04/18/16   Authorization - Visit Number 4   Authorization - Number of Visits 10   PT Start Time [id number]   PT Stop Time 1603   PT Time Calculation (min) 46 min   Activity Tolerance Patient tolerated treatment well   Behavior During Therapy [facility] for tasks assessed/performed      Past Medical History:  Diagnosis Date  . Arthritis   . Asthma   . Essential tremor   . 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 Hyperlipidemia   . Hypertension   . IBS (irritable bowel syndrome)   . Parkinson disease ([place])   . Rosacea   . Thyroid nodule     Past Surgical History:  Procedure Laterality Date  . APPENDECTOMY    . CATARACT EXTRACTION W/PHACO  05/03/2011   Procedure: CATARACT EXTRACTION PHACO AND INTRAOCULAR LENS PLACEMENT (IOC);  Surgeon: Elta Guadeloupe T. Gershon Crane;  Location: [facility];  Service: Ophthalmology;  Laterality: Right;  CDE: 7.52  . CATARACT EXTRACTION W/PHACO  05/17/2011   Procedure: CATARACT EXTRACTION PHACO AND INTRAOCULAR LENS PLACEMENT (IOC);  Surgeon: Elta Guadeloupe T. Gershon Crane;  Location: [facility];  Service: Ophthalmology;  Laterality: Left;  CDE 9.49  . CHOLECYSTECTOMY  6 yrs ago   aph-Dr Arnoldo Morale  . ESOPHAGEAL DILATION N/A 03/02/2016   Procedure: ESOPHAGEAL DILATION;  Surgeon: Rogene Houston, MD;  [facility];  Service: Endoscopy;  Laterality: N/A;  . ESOPHAGOGASTRODUODENOSCOPY N/A 03/02/2016   Procedure: ESOPHAGOGASTRODUODENOSCOPY (EGD);  Surgeon: Rogene Houston, MD;   [facility];  Service: Endoscopy;  Laterality: N/A;  1:25  . TONSILLECTOMY     as child    There were no vitals filed for this visit.      Subjective Assessment - 03/10/16 1520    Subjective Patient arrives today stating she is just not feeling good today, her BP has been running low recently. She just woke up feeling bad. She was sore after last session.    Pertinent History HTN, history of syncope, asthma, IBS    Currently in Pain? Yes   Pain Score 8    Pain Location Back   Pain Orientation Mid;Lower   Pain Descriptors / Indicators Sore;Nagging   Pain Type Acute pain   Pain Radiating Towards none    Pain Onset In the past 7 days   Pain Frequency Intermittent   Aggravating Factors  nothing    Pain Relieving Factors unsure    Effect of Pain on Daily Activities limits activity             [facility] PT Assessment - 03/10/16 0001      Observation/Other Assessments   Observations O2 93%, HR 93; 120/60 per auto cuff and manual cuff                      [facility] Adult PT Treatment/Exercise - 03/10/16 0001      Knee/Hip Exercises: Seated  Other Seated Knee/Hip Exercises diaphragmatic breathing in sitting 1x10     Knee/Hip Exercises: Supine   Other Supine Knee/Hip Exercises diaphragmatic breathing training 1x15 on 3 count breaths            PWR [facility]) - 03/10/16 1531    PWR! Up --   PWR! [place]! Twist --   PWR Step 1x5 standing   PWR! Up 1x10 seated    PWR! Rock 1x10 seated    PWR! Twist 1x10 seated    PWR! Step --             PT Education - 03/10/16 1607    Education provided Yes   Education Details work on diaphragmatic breathing at home, get out of pattern of accessory pattern breathing to promote improved endurance, posture, activity tolerance    Person(s) Educated Patient   Methods Explanation   Comprehension Verbalized understanding          PT Short Term Goals - 02/16/16 1216      PT SHORT TERM GOAL #1   Title  Patient will demonstrate improved gait mechanics in forward and backwards directions, including improved posture, step lengths, foot clearance and no drifting to either side in order to improve mobilty and reduce fall risk   Time 4   Period Weeks   Status New     PT SHORT TERM GOAL #2   Title Patient willl be able to tolerate at least 15 minutes of weight bearing with no increased pain in order to improve function within the community and at home    Time 4   Period Weeks   Status New     PT SHORT TERM GOAL #3   Title Patient will demonstrate improved posture during all functional situations in order to reduce fall risk and assist in improving overall mobility    Time 4   Period Weeks   Status New     PT SHORT TERM GOAL #4   Title Patient will experience no more than 1/10 pain in her R hip in order to demonstrate overall improvement of condition and improve QOL    Time 4   Period Weeks   Status New     PT SHORT TERM GOAL #5   Title Patient will be independent in correctly and consistently performing appropriate HEP, to be updated PRN    Time 4   Period Weeks   Status New           PT Long Term Goals - 02/16/16 1219      PT LONG TERM GOAL #1   Title Patient will demonstrate strength 5/5 in all tested muscle groups in order to reduce pain and improve function    Time 8   Period Weeks   Status New     PT LONG TERM GOAL #2   Title Patient will score at least 51 on BERG balance test in order to show improved balance/reduced fall risk    Time 8   Period Weeks   Status New     PT LONG TERM GOAL #3   Title Patient to be able to ambulate at least 679ft during 3MWT in order to demonstrate improved mobilty for home and community ambulation    Time 8   Period Weeks   Status New     PT LONG TERM GOAL #4   Title Patient to be participatory in regular exercise at senior center, at least 3 days per week, in order  to improve QOL and assist in maintaining regular exercise habit     Time 8   Period Weeks   Status New     PT LONG TERM GOAL #5   Title Patient to be able to tolerate weight bearing at least 30-45 minutes with no exacerbation of pain in order to improve function and QOL    Time 8   Period Weeks   Status New               Plan - 03/10/16 1609    Clinical Impression Statement Patient arrived today stating she is not feeling 100%; checked vitals and all numbers are WNL today despite patient reporting being mildly symptomatic. Patient wishes to continue with PT despite high pain levels and malaise, and focused on basic PWR training as well as diaphragmatic breathing, cues for posture and rest breaks provided PRN. Also worked on diaphragmatic breathing training in sitting and supine due to patient tendency to breath via respiratory muscles, which does appear to have significant effect on her activity tolerance and may be related to SOB she frequently experiences.    Rehab Potential Good   PT Frequency 2x / week   PT Duration 8 weeks   PT Treatment/Interventions ADLs/Self Care Home Management;Cryotherapy;Moist Heat;Gait training;Stair training;Functional mobility training;Therapeutic activities;Therapeutic exercise;Balance training;Neuromuscular re-education;Patient/family education;Manual techniques;Passive range of motion;Energy conservation;Taping   PT Next Visit Plan PWR training, gait/balance training. Also work on functional strengthening for LEs and hips. Diaphragmatic breathing.    Consulted and Agree with Plan of Care Patient      Patient will benefit from skilled therapeutic intervention in order to improve the following deficits and impairments:  Abnormal gait, Hypomobility, Decreased activity tolerance, Decreased strength, Pain, Decreased balance, Decreased mobility, Difficulty walking, Improper body mechanics, Decreased coordination, Impaired flexibility, Postural dysfunction  Visit Diagnosis: Difficulty in walking, not elsewhere  classified  Unsteadiness on feet  Pain in right hip  Muscle weakness (generalized)  Other symptoms and signs involving the musculoskeletal system     Problem List Patient Active Problem List   Diagnosis Date Noted  . Urge incontinence 12/19/2013  . 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 Parkinson's disease ([place]) 05/28/2013  . Essential hypertension 06/09/2009  . Asthma 06/09/2009  . GERD 06/09/2009  . SYNCOPE 06/09/2009  . Abnormal involuntary movement 06/09/2009    Deniece Ree PT, DPT [facility] [address] [place], [place], [address] Phone: [phone]   Fax:  [phone]  Name: Heather Gilmore MRN: [id number] Date of Birth: 1930-06-19

## 2016-03-10 NOTE — Patient Instructions (Signed)
    Deep Diaphragmatic Relaxed Belly Breathing  Lie down on your back in a comfortable position. Take a deep breath in and allow your belly to rise up towards the ceiling (place a hand on your belly if this is helpful). Exhale and allow your belly to naturally fall back down. Repeat 20 times, 2-3 times per day. Allow your body and mind to settle into a relaxed state.

## 2016-03-14 DIAGNOSIS — M1611 Unilateral primary osteoarthritis, right hip: Secondary | ICD-10-CM | POA: Diagnosis not present

## 2016-03-14 DIAGNOSIS — R42 Dizziness and giddiness: Secondary | ICD-10-CM | POA: Diagnosis not present

## 2016-03-15 ENCOUNTER — Ambulatory Visit (HOSPITAL_COMMUNITY): Payer: PPO | Admitting: Physical Therapy

## 2016-03-15 DIAGNOSIS — R262 Difficulty in walking, not elsewhere classified: Secondary | ICD-10-CM | POA: Diagnosis not present

## 2016-03-15 DIAGNOSIS — R2681 Unsteadiness on feet: Secondary | ICD-10-CM

## 2016-03-15 DIAGNOSIS — R29898 Other symptoms and signs involving the musculoskeletal system: Secondary | ICD-10-CM

## 2016-03-15 DIAGNOSIS — M25551 Pain in right hip: Secondary | ICD-10-CM

## 2016-03-15 DIAGNOSIS — M6281 Muscle weakness (generalized): Secondary | ICD-10-CM

## 2016-03-15 NOTE — Therapy (Signed)
[place], [place], [address] Phone: [phone]   Fax:  [phone]  Physical Therapy Treatment (Re-Assessment)  Patient Details  Name: Heather Gilmore MRN: [id number] Date of Birth: 1930-08-07 Referring Provider: Teresa Coombs   Encounter Date: 03/15/2016      PT End of Session - 03/15/16 1612    Visit Number 5   Number of Visits 13   Date for PT Re-Evaluation 04/12/16   Authorization Type [employer]  (G-codes done 5th session)   Authorization Time Period 02/16/16 to 04/18/16   Authorization - Visit Number 5   Authorization - Number of Visits 15   PT Start Time 1520   PT Stop Time 1558   PT Time Calculation (min) 38 min   Activity Tolerance Patient tolerated treatment well   Behavior During Therapy [facility] for tasks assessed/performed      Past Medical History:  Diagnosis Date  . Arthritis   . Asthma   . Essential tremor   . 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 Hyperlipidemia   . Hypertension   . IBS (irritable bowel syndrome)   . Parkinson disease ([place])   . Rosacea   . Thyroid nodule     Past Surgical History:  Procedure Laterality Date  . APPENDECTOMY    . CATARACT EXTRACTION W/PHACO  05/03/2011   Procedure: CATARACT EXTRACTION PHACO AND INTRAOCULAR LENS PLACEMENT (IOC);  Surgeon: Elta Guadeloupe T. Gershon Crane;  Location: [facility];  Service: Ophthalmology;  Laterality: Right;  CDE: 7.52  . CATARACT EXTRACTION W/PHACO  05/17/2011   Procedure: CATARACT EXTRACTION PHACO AND INTRAOCULAR LENS PLACEMENT (IOC);  Surgeon: Elta Guadeloupe T. Gershon Crane;  Location: [facility];  Service: Ophthalmology;  Laterality: Left;  CDE 9.49  . CHOLECYSTECTOMY  6 yrs ago   aph-Dr Arnoldo Morale  . ESOPHAGEAL DILATION N/A 03/02/2016   Procedure: ESOPHAGEAL DILATION;  Surgeon: Rogene Houston, MD;  Location: [facility];  Service: Endoscopy;  Laterality: N/A;  . ESOPHAGOGASTRODUODENOSCOPY N/A 03/02/2016   Procedure: ESOPHAGOGASTRODUODENOSCOPY  (EGD);  Surgeon: Rogene Houston, MD;  Location: [facility];  Service: Endoscopy;  Laterality: N/A;  1:25  . TONSILLECTOMY     as child    There were no vitals filed for this visit.      Subjective Assessment - 03/15/16 1536    Subjective Patient arrives today stating she feels like she is better, she states that her MD is happy with her progress thus far. She almost slipped on the cement outside but was able to catch herself without falling.    Pertinent History HTN, history of syncope, asthma, IBS    How long can you stand comfortably? 8/8- 5 minutes    How long can you walk comfortably? 8/8- 5 minutes    Patient Stated Goals get balance better, prevent more falls    Currently in Pain? Yes   Pain Score 5    Pain Location Back            [facility] PT Assessment - 03/15/16 0001      Strength   Right Hip Flexion 4+/5   Right Hip ABduction 3+/5   Left Hip Flexion 4+/5   Left Hip ABduction 3+/5   Right Knee Flexion 4+/5   Right Knee Extension 4+/5   Left Knee Flexion 4+/5   Left Knee Extension 4+/5   Right Ankle Dorsiflexion 5/5   Left Ankle Dorsiflexion 5/5     6 minute walk test results  Aerobic Endurance Distance Walked 395   Endurance additional comments 3MWT      Berg Balance Test   Sit to Stand Able to stand without using hands and stabilize independently   Standing Unsupported Able to stand safely 2 minutes   Sitting with Back Unsupported but Feet Supported on Floor or Stool Able to sit safely and securely 2 minutes   Stand to Sit Sits safely with minimal use of hands   Transfers Able to transfer safely, minor use of hands   Standing Unsupported with Eyes Closed Able to stand 10 seconds safely   Standing Ubsupported with Feet Together Able to place feet together independently and stand 1 minute safely   From Standing, Reach Forward with Outstretched Arm Can reach confidently >25 cm (10")   From Standing Position, Pick up Object from Floor Able to pick up shoe  safely and easily   From Standing Position, Turn to Look Behind Over each Shoulder Looks behind from both sides and weight shifts well   Turn 360 Degrees Able to turn 360 degrees safely one side only in 4 seconds or less   Standing Unsupported, Alternately Place Feet on Step/Stool Able to stand independently and safely and complete 8 steps in 20 seconds   Standing Unsupported, One Foot in Front Needs help to step but can hold 15 seconds   Standing on One Leg Able to lift leg independently and hold equal to or more than 3 seconds   Total Score 50                             PT Education - 03/15/16 1608    Education provided Yes   Education Details progress with skilled PT services, POC moving forward, extensive education provided regarding importance of regular aerobic activity and adherence to HEP in managing Parkinsonian symptmos    Person(s) Educated Patient   Methods Explanation   Comprehension Verbalized understanding          PT Short Term Goals - 03/15/16 1546      PT SHORT TERM GOAL #1   Title Patient will demonstrate improved gait mechanics in forward and backwards directions, including improved posture, step lengths, foot clearance and no drifting to either side in order to improve mobilty and reduce fall risk   Baseline 8/8- improving    Time 4   Period Weeks   Status On-going     PT SHORT TERM GOAL #2   Title Patient willl be able to tolerate at least 15 minutes of weight bearing with no increased pain in order to improve function within the community and at home    Baseline 8/8- unable to tolerate being up for 15 minutes at this point    Time 4   Period Weeks   Status On-going     PT SHORT TERM GOAL #3   Title Patient will demonstrate improved posture during all functional situations in order to reduce fall risk and assist in improving overall mobility    Baseline 8/8- remains impaired    Time 4   Period Weeks   Status On-going     PT SHORT  TERM GOAL #4   Title Patient will experience no more than 1/10 pain in her R hip in order to demonstrate overall improvement of condition and improve QOL    Baseline 8/8- no pain in her hip, mostly in the back    Time 4   Period Weeks  Status Achieved     PT SHORT TERM GOAL #5   Title Patient will be independent in correctly and consistently performing appropriate HEP, to be updated PRN    Baseline 8/8- having trouble keeping up with HEP    Time 4   Period Weeks   Status On-going           PT Long Term Goals - 03/15/16 1553      PT LONG TERM GOAL #1   Title Patient will demonstrate strength 5/5 in all tested muscle groups in order to reduce pain and improve function    Time 8   Period Weeks   Status On-going     PT LONG TERM GOAL #2   Title Patient will score at least 51 on BERG balance test in order to show improved balance/reduced fall risk    Baseline 8/8- 50   Time 8   Period Weeks   Status On-going     PT LONG TERM GOAL #3   Title Patient to be able to ambulate at least 660ft during 3MWT in order to demonstrate improved mobilty for home and community ambulation    Baseline 8/8- 346ft   Time 8   Period Weeks   Status On-going     PT LONG TERM GOAL #4   Title Patient to be participatory in regular exercise at senior center, at least 3 days per week, in order to improve QOL and assist in maintaining regular exercise habit    Time 8   Period Weeks   Status On-going     PT LONG TERM GOAL #5   Title Patient to be able to tolerate weight bearing at least 30-45 minutes with no exacerbation of pain in order to improve function and QOL    Time 8   Period Weeks   Status On-going               Plan - 03/15/16 1613    Clinical Impression Statement  Re-assessment performed today. Patient appears to be making slow but steady progress both on objective and subjective fronts; she states that she has been released from her MD as he is very happy with her progress.  Patient does continue to be limited by functional weakness, unsteadiness, reduced functional activity tolerance, postural and gait impairment, and impaired safety awareness, and does remain a fall risk. Extensive education provided regarding importance of regular aerobic exercise and adherence to HEP in managing Parkinsonian symptoms/speeding overall progress. At this point recommend extension of skilled PT services to continue focus on noted impairments, reduce fall risk, and reach optimal level of function.    Rehab Potential Good   PT Frequency 2x / week   PT Duration 4 weeks   PT Treatment/Interventions ADLs/Self Care Home Management;Cryotherapy;Moist Heat;Gait training;Stair training;Functional mobility training;Therapeutic activities;Therapeutic exercise;Balance training;Neuromuscular re-education;Patient/family education;Manual techniques;Passive range of motion;Energy conservation;Taping   PT Next Visit Plan PWR training, gait/balance training. Also work on functional strengthening for LEs and hips. Diaphragmatic breathing.    Consulted and Agree with Plan of Care Patient      Patient will benefit from skilled therapeutic intervention in order to improve the following deficits and impairments:  Abnormal gait, Hypomobility, Decreased activity tolerance, Decreased strength, Pain, Decreased balance, Decreased mobility, Difficulty walking, Improper body mechanics, Decreased coordination, Impaired flexibility, Postural dysfunction  Visit Diagnosis: Difficulty in walking, not elsewhere classified  Unsteadiness on feet  Pain in right hip  Muscle weakness (generalized)  Other symptoms and signs involving the musculoskeletal system  G-Codes - 03/15/16 1619    Functional Assessment Tool Used Based on skilled clinical assessment of balance, strength, posture, gait, fall risk    Functional Limitation Mobility: Walking and moving around   Mobility: Walking and Moving Around Current  Status [phone]) At least 60 percent but less than 80 percent impaired, limited or restricted   Mobility: Walking and Moving Around Goal Status [id number]) At least 40 percent but less than 60 percent impaired, limited or restricted      Problem List Patient Active Problem List   Diagnosis Date Noted  . Urge incontinence 12/19/2013  . 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 Parkinson's disease ([place]) 05/28/2013  . Essential hypertension 06/09/2009  . Asthma 06/09/2009  . GERD 06/09/2009  . SYNCOPE 06/09/2009  . Abnormal involuntary movement 06/09/2009    Deniece Ree PT, DPT [place] [address] [place], [place], [address] Phone: [phone]   Fax:  [phone]  Name: Heather Gilmore MRN: [id number] Date of Birth: 1930/07/20

## 2016-03-17 ENCOUNTER — Ambulatory Visit (HOSPITAL_COMMUNITY): Payer: PPO | Admitting: Physical Therapy

## 2016-03-17 DIAGNOSIS — R2681 Unsteadiness on feet: Secondary | ICD-10-CM

## 2016-03-17 DIAGNOSIS — R29898 Other symptoms and signs involving the musculoskeletal system: Secondary | ICD-10-CM

## 2016-03-17 DIAGNOSIS — M6281 Muscle weakness (generalized): Secondary | ICD-10-CM

## 2016-03-17 DIAGNOSIS — R262 Difficulty in walking, not elsewhere classified: Secondary | ICD-10-CM | POA: Diagnosis not present

## 2016-03-17 DIAGNOSIS — M25551 Pain in right hip: Secondary | ICD-10-CM

## 2016-03-17 NOTE — Therapy (Signed)
[place], [place], [address] Phone: [phone]   Fax:  [phone]  Physical Therapy Treatment  Patient Details  Name: Heather Gilmore MRN: [id number] Date of Birth: 1929/10/13 Referring Provider: Teresa Coombs   Encounter Date: 03/17/2016      PT End of Session - 03/17/16 1632    Visit Number 6   Number of Visits 13   Date for PT Re-Evaluation 04/12/16   Authorization Type [employer]  (G-codes done 5th session)   Authorization Time Period 02/16/16 to 04/18/16   Authorization - Visit Number 6   Authorization - Number of Visits 15   PT Start Time [id number]   PT Stop Time 1558   PT Time Calculation (min) 40 min   Activity Tolerance Patient tolerated treatment well   Behavior During Therapy [facility] for tasks assessed/performed      Past Medical History:  Diagnosis Date  . Arthritis   . Asthma   . Essential tremor   . 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 Hyperlipidemia   . Hypertension   . IBS (irritable bowel syndrome)   . Parkinson disease ([place])   . Rosacea   . Thyroid nodule     Past Surgical History:  Procedure Laterality Date  . APPENDECTOMY    . CATARACT EXTRACTION W/PHACO  05/03/2011   Procedure: CATARACT EXTRACTION PHACO AND INTRAOCULAR LENS PLACEMENT (IOC);  Surgeon: Elta Guadeloupe T. Gershon Crane;  Location: [facility];  Service: Ophthalmology;  Laterality: Right;  CDE: 7.52  . CATARACT EXTRACTION W/PHACO  05/17/2011   Procedure: CATARACT EXTRACTION PHACO AND INTRAOCULAR LENS PLACEMENT (IOC);  Surgeon: Elta Guadeloupe T. Gershon Crane;  Location: [facility];  Service: Ophthalmology;  Laterality: Left;  CDE 9.49  . CHOLECYSTECTOMY  6 yrs ago   aph-Dr Arnoldo Morale  . ESOPHAGEAL DILATION N/A 03/02/2016   Procedure: ESOPHAGEAL DILATION;  Surgeon: Rogene Houston, MD;  [facility];  Service: Endoscopy;  Laterality: N/A;  . ESOPHAGOGASTRODUODENOSCOPY N/A 03/02/2016   Procedure: ESOPHAGOGASTRODUODENOSCOPY (EGD);  Surgeon:  Rogene Houston, MD;  [facility];  Service: Endoscopy;  Laterality: N/A;  1:25  . TONSILLECTOMY     as child    There were no vitals filed for this visit.      Subjective Assessment - 03/17/16 1523    Subjective Patient arrives today stating that her back was hurting last night, but it felt better this morning. Nothing else major going on.    Pertinent History HTN, history of syncope, asthma, IBS    Currently in Pain? No/denies                         [facility] Adult PT Treatment/Exercise - 03/17/16 0001      Knee/Hip Exercises: Supine   Other Supine Knee/Hip Exercises diaphragmatic breathing 1x20           PWR [facility]) - 03/17/16 1527    PWR! Up 1x10 at high mat table    PWR! Rock 1x10 at [employer]! Twist 1x10 at high mat table    PWR! Step 1x10 at high mat table    PWR! Up 1x10 standing    PWR! Rock 1x10 standing    PWR! Twist 1x10 standing    PWR Step 1x10 standing    Comments in // bars              PT Education - 03/17/16  1632    Education provided No   Person(s) Educated Patient   Methods Explanation   Comprehension Verbalized understanding          PT Short Term Goals - 03/15/16 1546      PT SHORT TERM GOAL #1   Title Patient will demonstrate improved gait mechanics in forward and backwards directions, including improved posture, step lengths, foot clearance and no drifting to either side in order to improve mobilty and reduce fall risk   Baseline 8/8- improving    Time 4   Period Weeks   Status On-going     PT SHORT TERM GOAL #2   Title Patient willl be able to tolerate at least 15 minutes of weight bearing with no increased pain in order to improve function within the community and at home    Baseline 8/8- unable to tolerate being up for 15 minutes at this point    Time 4   Period Weeks   Status On-going     PT SHORT TERM GOAL #3   Title Patient will demonstrate improved posture during all functional  situations in order to reduce fall risk and assist in improving overall mobility    Baseline 8/8- remains impaired    Time 4   Period Weeks   Status On-going     PT SHORT TERM GOAL #4   Title Patient will experience no more than 1/10 pain in her R hip in order to demonstrate overall improvement of condition and improve QOL    Baseline 8/8- no pain in her hip, mostly in the back    Time 4   Period Weeks   Status Achieved     PT SHORT TERM GOAL #5   Title Patient will be independent in correctly and consistently performing appropriate HEP, to be updated PRN    Baseline 8/8- having trouble keeping up with HEP    Time 4   Period Weeks   Status On-going           PT Long Term Goals - 03/15/16 1553      PT LONG TERM GOAL #1   Title Patient will demonstrate strength 5/5 in all tested muscle groups in order to reduce pain and improve function    Time 8   Period Weeks   Status On-going     PT LONG TERM GOAL #2   Title Patient will score at least 51 on BERG balance test in order to show improved balance/reduced fall risk    Baseline 8/8- 50   Time 8   Period Weeks   Status On-going     PT LONG TERM GOAL #3   Title Patient to be able to ambulate at least 663ft during 3MWT in order to demonstrate improved mobilty for home and community ambulation    Baseline 8/8- 367ft   Time 8   Period Weeks   Status On-going     PT LONG TERM GOAL #4   Title Patient to be participatory in regular exercise at senior center, at least 3 days per week, in order to improve QOL and assist in maintaining regular exercise habit    Time 8   Period Weeks   Status On-going     PT LONG TERM GOAL #5   Title Patient to be able to tolerate weight bearing at least 30-45 minutes with no exacerbation of pain in order to improve function and QOL    Time 8   Period Weeks   Status On-going  Plan - 03/17/16 1633    Clinical Impression Statement Patient arrived appearing somewhat in a  fog today, not appearing to remember discussion performed at re-assessment regarding extended skilled PT services for 4 more weeks, "I thought I'd be dismissed and be done today". Patient agreeable to continuing with PT however, and focus was put on PWR training in modified high quadruped and standing positions today with visual and verbal cues for form, min guard for safety. Patient also demonstrates improved diaphragmatic breathing skills today as well, as well as apparent improved tolerance to activity today after training for deep breathing technique, however continues to fatigue easily.    Rehab Potential Good   PT Frequency 2x / week   PT Duration 4 weeks   PT Treatment/Interventions ADLs/Self Care Home Management;Cryotherapy;Moist Heat;Gait training;Stair training;Functional mobility training;Therapeutic activities;Therapeutic exercise;Balance training;Neuromuscular re-education;Patient/family education;Manual techniques;Passive range of motion;Energy conservation;Taping   PT Next Visit Plan PWR training, gait/balance training. Also work on functional strengthening for LEs and hips. Diaphragmatic breathing.    Consulted and Agree with Plan of Care Patient      Patient will benefit from skilled therapeutic intervention in order to improve the following deficits and impairments:  Abnormal gait, Hypomobility, Decreased activity tolerance, Decreased strength, Pain, Decreased balance, Decreased mobility, Difficulty walking, Improper body mechanics, Decreased coordination, Impaired flexibility, Postural dysfunction  Visit Diagnosis: Difficulty in walking, not elsewhere classified  Unsteadiness on feet  Pain in right hip  Muscle weakness (generalized)  Other symptoms and signs involving the musculoskeletal system     Problem List Patient Active Problem List   Diagnosis Date Noted  . Urge incontinence 12/19/2013  . 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 Parkinson's disease ([place])  05/28/2013  . Essential hypertension 06/09/2009  . Asthma 06/09/2009  . GERD 06/09/2009  . SYNCOPE 06/09/2009  . Abnormal involuntary movement 06/09/2009    Deniece Ree PT, DPT [place] [address] [place], [place], [address] Phone: [phone]   Fax:  [phone]  Name: Heather Gilmore MRN: [id number] Date of Birth: 1929/09/19

## 2016-03-22 ENCOUNTER — Ambulatory Visit (HOSPITAL_COMMUNITY): Payer: PPO | Admitting: Physical Therapy

## 2016-03-22 DIAGNOSIS — M25551 Pain in right hip: Secondary | ICD-10-CM

## 2016-03-22 DIAGNOSIS — R262 Difficulty in walking, not elsewhere classified: Secondary | ICD-10-CM

## 2016-03-22 DIAGNOSIS — R2681 Unsteadiness on feet: Secondary | ICD-10-CM

## 2016-03-22 DIAGNOSIS — R29898 Other symptoms and signs involving the musculoskeletal system: Secondary | ICD-10-CM

## 2016-03-22 DIAGNOSIS — M6281 Muscle weakness (generalized): Secondary | ICD-10-CM

## 2016-03-22 NOTE — Therapy (Signed)
[place], [place], [address] Phone: [phone]   Fax:  [phone]  Physical Therapy Treatment  Patient Details  Name: Heather Gilmore MRN: [id number] Date of Birth: [birth year] Referring Provider: Teresa Coombs   Encounter Date: 03/22/2016      PT End of Session - 03/22/16 1024    Visit Number 7   Number of Visits 13   Date for PT Re-Evaluation 04/12/16   Authorization Type [employer]  (G-codes done 5th session)   Authorization Time Period 02/16/16 to 04/18/16   Authorization - Visit Number 7   Authorization - Number of Visits 15   PT Start Time 0946   PT Stop Time 1025   PT Time Calculation (min) 39 min   Activity Tolerance Patient tolerated treatment well   Behavior During Therapy [facility] for tasks assessed/performed      Past Medical History:  Diagnosis Date  . Arthritis   . Asthma   . Essential tremor   . 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 Hyperlipidemia   . Hypertension   . IBS (irritable bowel syndrome)   . Parkinson disease ([place])   . Rosacea   . Thyroid nodule     Past Surgical History:  Procedure Laterality Date  . APPENDECTOMY    . CATARACT EXTRACTION W/PHACO  05/03/2011   Procedure: CATARACT EXTRACTION PHACO AND INTRAOCULAR LENS PLACEMENT (IOC);  Surgeon: Elta Guadeloupe T. Gershon Crane;  Location: [facility];  Service: Ophthalmology;  Laterality: Right;  CDE: 7.52  . CATARACT EXTRACTION W/PHACO  05/17/2011   Procedure: CATARACT EXTRACTION PHACO AND INTRAOCULAR LENS PLACEMENT (IOC);  Surgeon: Elta Guadeloupe T. Gershon Crane;  Location: [facility];  Service: Ophthalmology;  Laterality: Left;  CDE 9.49  . CHOLECYSTECTOMY  6 yrs ago   aph-Dr Arnoldo Morale  . ESOPHAGEAL DILATION N/A 03/02/2016   Procedure: ESOPHAGEAL DILATION;  Surgeon: Rogene Houston, MD;  Location: [facility];  Service: Endoscopy;  Laterality: N/A;  . ESOPHAGOGASTRODUODENOSCOPY N/A 03/02/2016   Procedure: ESOPHAGOGASTRODUODENOSCOPY (EGD);  Surgeon:  Rogene Houston, MD;  Location: [facility];  Service: Endoscopy;  Laterality: N/A;  1:25  . TONSILLECTOMY     as child    There were no vitals filed for this visit.      Subjective Assessment - 03/22/16 0948    Subjective Patient arrives today stating nothing major is going on, her back was really hurting her last night but it is feeling better today. No falls or close calls recently. When she does have pain in her back, she states it is "excruciating"; when cued, she describes it as stabbing, it is worse when she is cooking and she states she can ony relieve it by stretching out her back.     Pertinent History HTN, history of syncope, asthma, IBS    Currently in Pain? No/denies                            PWR [facility]) - 03/22/16 [id number]    PWR! Up 1x10 at high mat table    PWR! Rock 1x10 at [employer]! Twist 1x10 at high mat table    PWR! Step 1x10 at high mat table    Comments modified quadruped    PWR! Up 1x10 standing    PWR! Rock x10 standing    PWR! Twist x10 standing    PWR Step x10 standing  Comments in // bars        Trial of prone stretch, unable to tolerate; supine star stretch 4 minutes       PT Education - 03/22/16 1024    Education provided No          PT Short Term Goals - 03/15/16 1546      PT SHORT TERM GOAL #1   Title Patient will demonstrate improved gait mechanics in forward and backwards directions, including improved posture, step lengths, foot clearance and no drifting to either side in order to improve mobilty and reduce fall risk   Baseline 8/8- improving    Time 4   Period Weeks   Status On-going     PT SHORT TERM GOAL #2   Title Patient willl be able to tolerate at least 15 minutes of weight bearing with no increased pain in order to improve function within the community and at home    Baseline 8/8- unable to tolerate being up for 15 minutes at this point    Time 4   Period Weeks   Status On-going      PT SHORT TERM GOAL #3   Title Patient will demonstrate improved posture during all functional situations in order to reduce fall risk and assist in improving overall mobility    Baseline 8/8- remains impaired    Time 4   Period Weeks   Status On-going     PT SHORT TERM GOAL #4   Title Patient will experience no more than 1/10 pain in her R hip in order to demonstrate overall improvement of condition and improve QOL    Baseline 8/8- no pain in her hip, mostly in the back    Time 4   Period Weeks   Status Achieved     PT SHORT TERM GOAL #5   Title Patient will be independent in correctly and consistently performing appropriate HEP, to be updated PRN    Baseline 8/8- having trouble keeping up with HEP    Time 4   Period Weeks   Status On-going           PT Long Term Goals - 03/15/16 1553      PT LONG TERM GOAL #1   Title Patient will demonstrate strength 5/5 in all tested muscle groups in order to reduce pain and improve function    Time 8   Period Weeks   Status On-going     PT LONG TERM GOAL #2   Title Patient will score at least 51 on BERG balance test in order to show improved balance/reduced fall risk    Baseline 8/8- 50   Time 8   Period Weeks   Status On-going     PT LONG TERM GOAL #3   Title Patient to be able to ambulate at least 650ft during 3MWT in order to demonstrate improved mobilty for home and community ambulation    Baseline 8/8- 353ft   Time 8   Period Weeks   Status On-going     PT LONG TERM GOAL #4   Title Patient to be participatory in regular exercise at senior center, at least 3 days per week, in order to improve QOL and assist in maintaining regular exercise habit    Time 8   Period Weeks   Status On-going     PT LONG TERM GOAL #5   Title Patient to be able to tolerate weight bearing at least 30-45 minutes with no exacerbation of pain in  order to improve function and QOL    Time 8   Period Weeks   Status On-going                Plan - 03/22/16 1025    Clinical Impression Statement Continued with modified PWR exercises in quadruped at edge of mat table with improved flexibility and form noted today; also discussed patient's complaints of back pain, and per description/limited ability to reproduce today, they likely seem related to poor posture and muscle tightness. Introduced prone stretch for posture and hip flexors however patient unable to tolerate due to increased lower thoracic pain today; also trialed supine star stretch today with cues for form with better tolerance of this technique. Otherwise continued with standing PWR exercises today; will plan to introduce number board and mat cat/old horse as well.    Rehab Potential Good   PT Frequency 2x / week   PT Duration 4 weeks   PT Treatment/Interventions ADLs/Self Care Home Management;Cryotherapy;Moist Heat;Gait training;Stair training;Functional mobility training;Therapeutic activities;Therapeutic exercise;Balance training;Neuromuscular re-education;Patient/family education;Manual techniques;Passive range of motion;Energy conservation;Taping   PT Next Visit Plan PWR training, gait/balance training. Also work on functional strengthening for LEs and hips. Diaphragmatic breathing. Trial number board and mat cat/old horse    Consulted and Agree with Plan of Care Patient      Patient will benefit from skilled therapeutic intervention in order to improve the following deficits and impairments:  Abnormal gait, Hypomobility, Decreased activity tolerance, Decreased strength, Pain, Decreased balance, Decreased mobility, Difficulty walking, Improper body mechanics, Decreased coordination, Impaired flexibility, Postural dysfunction  Visit Diagnosis: Difficulty in walking, not elsewhere classified  Unsteadiness on feet  Pain in right hip  Muscle weakness (generalized)  Other symptoms and signs involving the musculoskeletal system     Problem List Patient Active  Problem List   Diagnosis Date Noted  . Urge incontinence 12/19/2013  . 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 Parkinson's disease ([place]) 05/28/2013  . Essential hypertension 06/09/2009  . Asthma 06/09/2009  . GERD 06/09/2009  . SYNCOPE 06/09/2009  . Abnormal involuntary movement 06/09/2009    Deniece Ree PT, DPT [place] [address] [place], [place], [address] Phone: [phone]   Fax:  [phone]  Name: Heather Gilmore MRN: [id number] Date of Birth: 12/02/1929

## 2016-03-23 ENCOUNTER — Ambulatory Visit (HOSPITAL_COMMUNITY): Payer: PPO | Admitting: Physical Therapy

## 2016-03-23 DIAGNOSIS — R2681 Unsteadiness on feet: Secondary | ICD-10-CM

## 2016-03-23 DIAGNOSIS — R262 Difficulty in walking, not elsewhere classified: Secondary | ICD-10-CM | POA: Diagnosis not present

## 2016-03-23 DIAGNOSIS — R29898 Other symptoms and signs involving the musculoskeletal system: Secondary | ICD-10-CM

## 2016-03-23 DIAGNOSIS — M25551 Pain in right hip: Secondary | ICD-10-CM

## 2016-03-23 DIAGNOSIS — M6281 Muscle weakness (generalized): Secondary | ICD-10-CM

## 2016-03-23 NOTE — Therapy (Signed)
[address] [place], [place], [address] Phone: [phone]   Fax:  [phone]  Physical Therapy Treatment  Patient Details  Name: Heather Gilmore MRN: [id number] Date of Birth: 12/23/1929 Referring Provider: Teresa Coombs   Encounter Date: 03/23/2016      PT End of Session - 03/23/16 1217    Visit Number 8   Number of Visits 13   Date for PT Re-Evaluation 04/12/16   Authorization Type [employer]  (G-codes done 5th session)   Authorization Time Period 02/16/16 to 04/18/16   Authorization - Visit Number 8   Authorization - Number of Visits 15   PT Start Time [id number]   PT Stop Time 1115   PT Time Calculation (min) 40 min   Activity Tolerance Patient tolerated treatment well   Behavior During Therapy [facility] for tasks assessed/performed      Past Medical History:  Diagnosis Date  . Arthritis   . Asthma   . Essential tremor   . 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 Hyperlipidemia   . Hypertension   . IBS (irritable bowel syndrome)   . Parkinson disease ([place])   . Rosacea   . Thyroid nodule     Past Surgical History:  Procedure Laterality Date  . APPENDECTOMY    . CATARACT EXTRACTION W/PHACO  05/03/2011   Procedure: CATARACT EXTRACTION PHACO AND INTRAOCULAR LENS PLACEMENT (IOC);  Surgeon: Elta Guadeloupe T. Gershon Crane;  Location: [facility];  Service: Ophthalmology;  Laterality: Right;  CDE: 7.52  . CATARACT EXTRACTION W/PHACO  05/17/2011   Procedure: CATARACT EXTRACTION PHACO AND INTRAOCULAR LENS PLACEMENT (IOC);  Surgeon: Elta Guadeloupe T. Gershon Crane;  Location: [facility];  Service: Ophthalmology;  Laterality: Left;  CDE 9.49  . CHOLECYSTECTOMY  6 yrs ago   aph-Dr Arnoldo Morale  . ESOPHAGEAL DILATION N/A 03/02/2016   Procedure: ESOPHAGEAL DILATION;  Surgeon: Rogene Houston, MD;  [facility];  Service: Endoscopy;  Laterality: N/A;  . ESOPHAGOGASTRODUODENOSCOPY N/A 03/02/2016   Procedure: ESOPHAGOGASTRODUODENOSCOPY (EGD);  Surgeon:  Rogene Houston, MD;  [facility];  Service: Endoscopy;  Laterality: N/A;  1:25  . TONSILLECTOMY     as child    There were no vitals filed for this visit.      Subjective Assessment - 03/23/16 1045    Subjective Patient arrives today stating she is feeling good, no pain today or major changes since yesterday    Pertinent History HTN, history of syncope, asthma, IBS    Currently in Pain? No/denies                         [facility] Adult PT Treatment/Exercise - 03/23/16 0001      Knee/Hip Exercises: Stretches   Other Knee/Hip Stretches supine starfish stretch with towels under ankles/weights on knees for posture x5 minutes      Knee/Hip Exercises: Aerobic   Nustep 5 minutes, level 1 no hills  supervision provided in order to ensure tolerance           PWR [facility]) - 03/23/16 1046    PWR! Up 1x6 at high mat table, weighted ball    PWR! Twist 1x10 at high mat table    Comments modified quadruped    PWR! Up 1x10 standing    PWR! Rock [employer] for [employer] rocks    PWR Step approx 29ft with high stepping, trekking poles  PT Education - 03/23/16 1217    Education provided Yes   Education Details importance of aerobic activity in general health and managing parkinsons    Person(s) Educated Patient   Methods Explanation   Comprehension Verbalized understanding          PT Short Term Goals - 03/15/16 1546      PT SHORT TERM GOAL #1   Title Patient will demonstrate improved gait mechanics in forward and backwards directions, including improved posture, step lengths, foot clearance and no drifting to either side in order to improve mobilty and reduce fall risk   Baseline 8/8- improving    Time 4   Period Weeks   Status On-going     PT SHORT TERM GOAL #2   Title Patient willl be able to tolerate at least 15 minutes of weight bearing with no increased pain in order to improve function within the community and at home     Baseline 8/8- unable to tolerate being up for 15 minutes at this point    Time 4   Period Weeks   Status On-going     PT SHORT TERM GOAL #3   Title Patient will demonstrate improved posture during all functional situations in order to reduce fall risk and assist in improving overall mobility    Baseline 8/8- remains impaired    Time 4   Period Weeks   Status On-going     PT SHORT TERM GOAL #4   Title Patient will experience no more than 1/10 pain in her R hip in order to demonstrate overall improvement of condition and improve QOL    Baseline 8/8- no pain in her hip, mostly in the back    Time 4   Period Weeks   Status Achieved     PT SHORT TERM GOAL #5   Title Patient will be independent in correctly and consistently performing appropriate HEP, to be updated PRN    Baseline 8/8- having trouble keeping up with HEP    Time 4   Period Weeks   Status On-going           PT Long Term Goals - 03/15/16 1553      PT LONG TERM GOAL #1   Title Patient will demonstrate strength 5/5 in all tested muscle groups in order to reduce pain and improve function    Time 8   Period Weeks   Status On-going     PT LONG TERM GOAL #2   Title Patient will score at least 51 on BERG balance test in order to show improved balance/reduced fall risk    Baseline 8/8- 50   Time 8   Period Weeks   Status On-going     PT LONG TERM GOAL #3   Title Patient to be able to ambulate at least 673ft during 3MWT in order to demonstrate improved mobilty for home and community ambulation    Baseline 8/8- 314ft   Time 8   Period Weeks   Status On-going     PT LONG TERM GOAL #4   Title Patient to be participatory in regular exercise at senior center, at least 3 days per week, in order to improve QOL and assist in maintaining regular exercise habit    Time 8   Period Weeks   Status On-going     PT LONG TERM GOAL #5   Title Patient to be able to tolerate weight bearing at least 30-45 minutes with no  exacerbation of pain  in order to improve function and QOL    Time 8   Period Weeks   Status On-going               Plan - 03/23/16 1217    Clinical Impression Statement Continued with standing PWR exercises today, introducing high PWR stepping with B trekking poles and min guard/occasional Min assist due to occasional LOB; continued with supine postural stretch and introduced number board today as well. Noted unsteadiness and difficulty with balance recovery strategies today with more advanced standing PWR activities, min guard-min(A) required for safety. Introduced [employer] today as well with supervision provided for safety due to new aerobic activity.    Rehab Potential Good   PT Frequency 2x / week   PT Duration 4 weeks   PT Treatment/Interventions ADLs/Self Care Home Management;Cryotherapy;Moist Heat;Gait training;Stair training;Functional mobility training;Therapeutic activities;Therapeutic exercise;Balance training;Neuromuscular re-education;Patient/family education;Manual techniques;Passive range of motion;Energy conservation;Taping   PT Next Visit Plan PWR training, gait/balance training. Also work on functional strengthening for LEs and hips. Diaphragmatic breathing. Continue number board and trial lumbar flexion seated with ball     Consulted and Agree with Plan of Care Patient      Patient will benefit from skilled therapeutic intervention in order to improve the following deficits and impairments:  Abnormal gait, Hypomobility, Decreased activity tolerance, Decreased strength, Pain, Decreased balance, Decreased mobility, Difficulty walking, Improper body mechanics, Decreased coordination, Impaired flexibility, Postural dysfunction  Visit Diagnosis: Difficulty in walking, not elsewhere classified  Unsteadiness on feet  Pain in right hip  Muscle weakness (generalized)  Other symptoms and signs involving the musculoskeletal system     Problem List Patient Active  Problem List   Diagnosis Date Noted  . Urge incontinence 12/19/2013  . 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 Parkinson's disease ([place]) 05/28/2013  . Essential hypertension 06/09/2009  . Asthma 06/09/2009  . GERD 06/09/2009  . SYNCOPE 06/09/2009  . Abnormal involuntary movement 06/09/2009    Deniece Ree PT, DPT [place] [address] [place], [place], [address] Phone: [phone]   Fax:  [phone]  Name: SHADAYA GRABLE MRN: [id number] Date of Birth: [birth year]

## 2016-03-29 ENCOUNTER — Ambulatory Visit (HOSPITAL_COMMUNITY): Payer: PPO | Admitting: Physical Therapy

## 2016-03-29 DIAGNOSIS — M25551 Pain in right hip: Secondary | ICD-10-CM

## 2016-03-29 DIAGNOSIS — R29898 Other symptoms and signs involving the musculoskeletal system: Secondary | ICD-10-CM

## 2016-03-29 DIAGNOSIS — R2681 Unsteadiness on feet: Secondary | ICD-10-CM

## 2016-03-29 DIAGNOSIS — M6281 Muscle weakness (generalized): Secondary | ICD-10-CM

## 2016-03-29 DIAGNOSIS — R262 Difficulty in walking, not elsewhere classified: Secondary | ICD-10-CM | POA: Diagnosis not present

## 2016-03-29 NOTE — Therapy (Signed)
[place], [place], [address] Phone: [phone]   Fax:  [phone]  Physical Therapy Treatment  Patient Details  Name: Heather Gilmore MRN: [id number] Date of Birth: May 31, 1930 Referring Provider: Teresa Coombs   Encounter Date: 03/29/2016      PT End of Session - 03/29/16 1737    Visit Number 9   Number of Visits 13   Date for PT Re-Evaluation 04/12/16   Authorization Type [employer]  (G-codes done 5th session)   Authorization Time Period 02/16/16 to 04/18/16   Authorization - Visit Number 9   Authorization - Number of Visits 15   PT Start Time [id number]   PT Stop Time 1556   PT Time Calculation (min) 38 min   Activity Tolerance Patient tolerated treatment well   Behavior During Therapy [facility] for tasks assessed/performed      Past Medical History:  Diagnosis Date  . Arthritis   . Asthma   . Essential tremor   . 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 Hyperlipidemia   . Hypertension   . IBS (irritable bowel syndrome)   . Parkinson disease ([place])   . Rosacea   . Thyroid nodule     Past Surgical History:  Procedure Laterality Date  . APPENDECTOMY    . CATARACT EXTRACTION W/PHACO  05/03/2011   Procedure: CATARACT EXTRACTION PHACO AND INTRAOCULAR LENS PLACEMENT (IOC);  Surgeon: Elta Guadeloupe T. Gershon Crane;  Location: [facility];  Service: Ophthalmology;  Laterality: Right;  CDE: 7.52  . CATARACT EXTRACTION W/PHACO  05/17/2011   Procedure: CATARACT EXTRACTION PHACO AND INTRAOCULAR LENS PLACEMENT (IOC);  Surgeon: Elta Guadeloupe T. Gershon Crane;  Location: [facility];  Service: Ophthalmology;  Laterality: Left;  CDE 9.49  . CHOLECYSTECTOMY  6 yrs ago   aph-Dr Arnoldo Morale  . ESOPHAGEAL DILATION N/A 03/02/2016   Procedure: ESOPHAGEAL DILATION;  Surgeon: Rogene Houston, MD;  [facility];  Service: Endoscopy;  Laterality: N/A;  . ESOPHAGOGASTRODUODENOSCOPY N/A 03/02/2016   Procedure: ESOPHAGOGASTRODUODENOSCOPY (EGD);  Surgeon:  Rogene Houston, MD;  [facility];  Service: Endoscopy;  Laterality: N/A;  1:25  . TONSILLECTOMY     as child    There were no vitals filed for this visit.      Subjective Assessment - 03/29/16 1521    Subjective (P)  Patient arrives today stating she is not feeling 100%, she has been feeling quite dizzy and it started on Sunday night without any major changes in her habits/schedules. She has not checked her BP since the dizziness started.    Pertinent History (P)  HTN, history of syncope, asthma, IBS    Currently in Pain? (P)  No/denies            [facility] PT Assessment - 03/29/16 0001      Observation/Other Assessments   Observations HR 64, O2 97 on RA, BP 126/76 per manual cuff at seated rest                      [facility] Adult PT Treatment/Exercise - 03/29/16 0001      Therapeutic Activites    Therapeutic Activities Other Therapeutic Activities   Other Therapeutic Activities forwards and retro gait with boom whackers for focus on coordination and multi-tasking, reduced fall risk, verbal cues            PWR [facility]) - 03/29/16 1735    PWR! Up 1x7 standing in // bars;  1x8 standing with boom whackers    PWR! Rock 1x10 standing in // bars    PWR! Twist 1x12 standing with boom whackers    PWR Step 4x3 hurdles in // bars standing              PT Education - 03/29/16 1736    Education provided No          PT Short Term Goals - 03/15/16 1546      PT SHORT TERM GOAL #1   Title Patient will demonstrate improved gait mechanics in forward and backwards directions, including improved posture, step lengths, foot clearance and no drifting to either side in order to improve mobilty and reduce fall risk   Baseline 8/8- improving    Time 4   Period Weeks   Status On-going     PT SHORT TERM GOAL #2   Title Patient willl be able to tolerate at least 15 minutes of weight bearing with no increased pain in order to improve function within the community  and at home    Baseline 8/8- unable to tolerate being up for 15 minutes at this point    Time 4   Period Weeks   Status On-going     PT SHORT TERM GOAL #3   Title Patient will demonstrate improved posture during all functional situations in order to reduce fall risk and assist in improving overall mobility    Baseline 8/8- remains impaired    Time 4   Period Weeks   Status On-going     PT SHORT TERM GOAL #4   Title Patient will experience no more than 1/10 pain in her R hip in order to demonstrate overall improvement of condition and improve QOL    Baseline 8/8- no pain in her hip, mostly in the back    Time 4   Period Weeks   Status Achieved     PT SHORT TERM GOAL #5   Title Patient will be independent in correctly and consistently performing appropriate HEP, to be updated PRN    Baseline 8/8- having trouble keeping up with HEP    Time 4   Period Weeks   Status On-going           PT Long Term Goals - 03/15/16 1553      PT LONG TERM GOAL #1   Title Patient will demonstrate strength 5/5 in all tested muscle groups in order to reduce pain and improve function    Time 8   Period Weeks   Status On-going     PT LONG TERM GOAL #2   Title Patient will score at least 51 on BERG balance test in order to show improved balance/reduced fall risk    Baseline 8/8- 50   Time 8   Period Weeks   Status On-going     PT LONG TERM GOAL #3   Title Patient to be able to ambulate at least 630ft during 3MWT in order to demonstrate improved mobilty for home and community ambulation    Baseline 8/8- 328ft   Time 8   Period Weeks   Status On-going     PT LONG TERM GOAL #4   Title Patient to be participatory in regular exercise at senior center, at least 3 days per week, in order to improve QOL and assist in maintaining regular exercise habit    Time 8   Period Weeks   Status On-going     PT LONG TERM GOAL #5  Title Patient to be able to tolerate weight bearing at least 30-45  minutes with no exacerbation of pain in order to improve function and QOL    Time 8   Period Weeks   Status On-going               Plan - 03/29/16 1737    Clinical Impression Statement Patient arrives today stating she has not been feeling well the past couple of days- she has been dizzy and it started Sunday but it is normal for her to have ups and downs. O2 97% on RA, HR 64, and BP 126/76 via objective vitals data taken today per pulse ox and manual BP cuff, no major physical symptoms or signs that would otherwise preclude patient from participation today. Performed standing PWR exercises in parallel bars and also introduced more dynamic activities with boom whackers, including forwards and retro gait to address coordination, balance, and multi-tasking facets of Parkinsonism. Patient reported some fatigue at end of session.     Rehab Potential Good   PT Frequency 2x / week   PT Duration 4 weeks   PT Treatment/Interventions ADLs/Self Care Home Management;Cryotherapy;Moist Heat;Gait training;Stair training;Functional mobility training;Therapeutic activities;Therapeutic exercise;Balance training;Neuromuscular re-education;Patient/family education;Manual techniques;Passive range of motion;Energy conservation;Taping   PT Next Visit Plan focus on modified quadruped and standing PWR, contine to progress PWR tasks. Continue aerobic exercise training and discuss local support activities/groups.    Consulted and Agree with Plan of Care Patient      Patient will benefit from skilled therapeutic intervention in order to improve the following deficits and impairments:  Abnormal gait, Hypomobility, Decreased activity tolerance, Decreased strength, Pain, Decreased balance, Decreased mobility, Difficulty walking, Improper body mechanics, Decreased coordination, Impaired flexibility, Postural dysfunction  Visit Diagnosis: Difficulty in walking, not elsewhere classified  Unsteadiness on feet  Pain in  right hip  Muscle weakness (generalized)  Other symptoms and signs involving the musculoskeletal system     Problem List Patient Active Problem List   Diagnosis Date Noted  . Urge incontinence 12/19/2013  . 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 Parkinson's disease ([place]) 05/28/2013  . Essential hypertension 06/09/2009  . Asthma 06/09/2009  . GERD 06/09/2009  . SYNCOPE 06/09/2009  . Abnormal involuntary movement 06/09/2009    Deniece Ree PT, DPT [place] [address] [place], [place], [address] Phone: [phone]   Fax:  [phone]  Name: Heather Gilmore MRN: [id number] Date of Birth: 01-17-1930

## 2016-03-31 ENCOUNTER — Ambulatory Visit (HOSPITAL_COMMUNITY): Payer: PPO | Admitting: Physical Therapy

## 2016-03-31 DIAGNOSIS — R262 Difficulty in walking, not elsewhere classified: Secondary | ICD-10-CM

## 2016-03-31 DIAGNOSIS — M25551 Pain in right hip: Secondary | ICD-10-CM

## 2016-03-31 DIAGNOSIS — R2681 Unsteadiness on feet: Secondary | ICD-10-CM

## 2016-03-31 DIAGNOSIS — M6281 Muscle weakness (generalized): Secondary | ICD-10-CM

## 2016-03-31 DIAGNOSIS — R29898 Other symptoms and signs involving the musculoskeletal system: Secondary | ICD-10-CM

## 2016-03-31 NOTE — Therapy (Signed)
[place], [place], [address] Phone: [phone]   Fax:  [phone]  Physical Therapy Treatment  Patient Details  Name: Heather Gilmore MRN: [id number] Date of Birth: Oct 26, 1929 Referring Provider: Teresa Coombs   Encounter Date: 03/31/2016      PT End of Session - 03/31/16 1756    Visit Number 10   Number of Visits 13   Date for PT Re-Evaluation 04/12/16   Authorization Type [employer]  (G-codes done 5th session)   Authorization Time Period 02/16/16 to 04/18/16   Authorization - Visit Number 10   Authorization - Number of Visits 15   PT Start Time [id number]  patient arrived late    PT Stop Time [id number]  ended with 5 minutes on Nustep (not inculded in billing)   PT Time Calculation (min) 27 min   Activity Tolerance Patient tolerated treatment well   Behavior During Therapy [facility] for tasks assessed/performed      Past Medical History:  Diagnosis Date  . Arthritis   . Asthma   . Essential tremor   . 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 Hyperlipidemia   . Hypertension   . IBS (irritable bowel syndrome)   . Parkinson disease ([place])   . Rosacea   . Thyroid nodule     Past Surgical History:  Procedure Laterality Date  . APPENDECTOMY    . CATARACT EXTRACTION W/PHACO  05/03/2011   Procedure: CATARACT EXTRACTION PHACO AND INTRAOCULAR LENS PLACEMENT (IOC);  Surgeon: Elta Guadeloupe T. Gershon Crane;  [facility];  Service: Ophthalmology;  Laterality: Right;  CDE: 7.52  . CATARACT EXTRACTION W/PHACO  05/17/2011   Procedure: CATARACT EXTRACTION PHACO AND INTRAOCULAR LENS PLACEMENT (IOC);  Surgeon: Elta Guadeloupe T. Gershon Crane;  Location: [facility];  Service: Ophthalmology;  Laterality: Left;  CDE 9.49  . CHOLECYSTECTOMY  6 yrs ago   aph-Dr Arnoldo Morale  . ESOPHAGEAL DILATION N/A 03/02/2016   Procedure: ESOPHAGEAL DILATION;  Surgeon: Rogene Houston, MD;  [facility];  Service: Endoscopy;  Laterality: N/A;  .  ESOPHAGOGASTRODUODENOSCOPY N/A 03/02/2016   Procedure: ESOPHAGOGASTRODUODENOSCOPY (EGD);  Surgeon: Rogene Houston, MD;  [facility];  Service: Endoscopy;  Laterality: N/A;  1:25  . TONSILLECTOMY     as child    There were no vitals filed for this visit.      Subjective Assessment - 03/31/16 1752    Subjective Patient arrives late today, states she is feeling good and apologizes for being late for her appointment. No major changes.    Pertinent History HTN, history of syncope, asthma, IBS    Currently in Pain? No/denies                         [facility] Adult PT Treatment/Exercise - 03/31/16 0001      Therapeutic Activites    Other Therapeutic Activities forwards and retro gait with boom whackers for multi-tasking and coordination; cross-midline reaches with boom whackers on progressively small stance on foam pad with min guard      Knee/Hip Exercises: Aerobic   Nustep 5 minutes, level 2 no hills  not included in billing            P[facility]) - 03/31/16 1755    Comments large stepping activity with hiking poles 4x28ft, min guard              PT Education - 03/31/16 1756  Education provided No          PT Short Term Goals - 03/15/16 1546      PT SHORT TERM GOAL #1   Title Patient will demonstrate improved gait mechanics in forward and backwards directions, including improved posture, step lengths, foot clearance and no drifting to either side in order to improve mobilty and reduce fall risk   Baseline 8/8- improving    Time 4   Period Weeks   Status On-going     PT SHORT TERM GOAL #2   Title Patient willl be able to tolerate at least 15 minutes of weight bearing with no increased pain in order to improve function within the community and at home    Baseline 8/8- unable to tolerate being up for 15 minutes at this point    Time 4   Period Weeks   Status On-going     PT SHORT TERM GOAL #3   Title Patient will demonstrate improved  posture during all functional situations in order to reduce fall risk and assist in improving overall mobility    Baseline 8/8- remains impaired    Time 4   Period Weeks   Status On-going     PT SHORT TERM GOAL #4   Title Patient will experience no more than 1/10 pain in her R hip in order to demonstrate overall improvement of condition and improve QOL    Baseline 8/8- no pain in her hip, mostly in the back    Time 4   Period Weeks   Status Achieved     PT SHORT TERM GOAL #5   Title Patient will be independent in correctly and consistently performing appropriate HEP, to be updated PRN    Baseline 8/8- having trouble keeping up with HEP    Time 4   Period Weeks   Status On-going           PT Long Term Goals - 03/15/16 1553      PT LONG TERM GOAL #1   Title Patient will demonstrate strength 5/5 in all tested muscle groups in order to reduce pain and improve function    Time 8   Period Weeks   Status On-going     PT LONG TERM GOAL #2   Title Patient will score at least 51 on BERG balance test in order to show improved balance/reduced fall risk    Baseline 8/8- 50   Time 8   Period Weeks   Status On-going     PT LONG TERM GOAL #3   Title Patient to be able to ambulate at least 644ft during 3MWT in order to demonstrate improved mobilty for home and community ambulation    Baseline 8/8- 351ft   Time 8   Period Weeks   Status On-going     PT LONG TERM GOAL #4   Title Patient to be participatory in regular exercise at senior center, at least 3 days per week, in order to improve QOL and assist in maintaining regular exercise habit    Time 8   Period Weeks   Status On-going     PT LONG TERM GOAL #5   Title Patient to be able to tolerate weight bearing at least 30-45 minutes with no exacerbation of pain in order to improve function and QOL    Time 8   Period Weeks   Status On-going               Plan - 03/31/16 1757  Clinical Impression Statement Patient  arrived late today. Began session with large reciprocal stepping with trekking poles and min guard/min assist, cues for form and safety. Focused majority of session on multi-tasking, large amplitude coordination activities with boom whackers today, with patient continuing to respond well to dynamic challenge although she does continue to require min guard/min(A) for safety during dynamic tasks, required min(A) to prevent LOB multiple times during today's session. Ended session on Nustep to address functional activity tolerance (not included in billing).    Rehab Potential Good   PT Frequency 2x / week   PT Duration 4 weeks   PT Treatment/Interventions ADLs/Self Care Home Management;Cryotherapy;Moist Heat;Gait training;Stair training;Functional mobility training;Therapeutic activities;Therapeutic exercise;Balance training;Neuromuscular re-education;Patient/family education;Manual techniques;Passive range of motion;Energy conservation;Taping   PT Next Visit Plan continue working on dynamic activities and challenges to multi-tasking/coordination; functional activity tolerance. Discuss local support groups and activities.    Consulted and Agree with Plan of Care Patient      Patient will benefit from skilled therapeutic intervention in order to improve the following deficits and impairments:  Abnormal gait, Hypomobility, Decreased activity tolerance, Decreased strength, Pain, Decreased balance, Decreased mobility, Difficulty walking, Improper body mechanics, Decreased coordination, Impaired flexibility, Postural dysfunction  Visit Diagnosis: Difficulty in walking, not elsewhere classified  Unsteadiness on feet  Pain in right hip  Muscle weakness (generalized)  Other symptoms and signs involving the musculoskeletal system     Problem List Patient Active Problem List   Diagnosis Date Noted  . Urge incontinence 12/19/2013  . 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 Parkinson's disease ([place])  05/28/2013  . Essential hypertension 06/09/2009  . Asthma 06/09/2009  . GERD 06/09/2009  . SYNCOPE 06/09/2009  . Abnormal involuntary movement 06/09/2009    Deniece Ree PT, DPT [facility] [address] [place], [place], [address] Phone: [phone]   Fax:  [phone]  Name: Heather Gilmore MRN: [id number] Date of Birth: [birth year]

## 2016-04-05 ENCOUNTER — Telehealth (HOSPITAL_COMMUNITY): Payer: Self-pay | Admitting: Physical Therapy

## 2016-04-05 ENCOUNTER — Ambulatory Visit (HOSPITAL_COMMUNITY): Payer: PPO | Admitting: Physical Therapy

## 2016-04-05 ENCOUNTER — Ambulatory Visit: Payer: PPO | Admitting: Family Medicine

## 2016-04-05 NOTE — Telephone Encounter (Signed)
Patient a no-show for today's appointment. Called and spoke to patient, who stated that she had called (no messages/calls from patient per front desk staff) and she decided that she must not have an appointment today. Patient refused to come in to an open appointment  this afternoon, stating "I'm not interested in that"; she also states she does not plan to be back until her next scheduled appointment on September 7th.   Reminded patient of time/date of next scheduled session.  Deniece Ree PT, DPT [phone]

## 2016-04-14 ENCOUNTER — Ambulatory Visit (HOSPITAL_COMMUNITY): Payer: PPO | Attending: Sports Medicine | Admitting: Physical Therapy

## 2016-04-14 DIAGNOSIS — R262 Difficulty in walking, not elsewhere classified: Secondary | ICD-10-CM | POA: Diagnosis not present

## 2016-04-14 DIAGNOSIS — M6281 Muscle weakness (generalized): Secondary | ICD-10-CM | POA: Diagnosis not present

## 2016-04-14 DIAGNOSIS — R29898 Other symptoms and signs involving the musculoskeletal system: Secondary | ICD-10-CM | POA: Diagnosis not present

## 2016-04-14 DIAGNOSIS — R2681 Unsteadiness on feet: Secondary | ICD-10-CM | POA: Diagnosis not present

## 2016-04-14 DIAGNOSIS — M25551 Pain in right hip: Secondary | ICD-10-CM | POA: Insufficient documentation

## 2016-04-14 NOTE — Therapy (Signed)
[place], [place], [address] Phone: [phone]   Fax:  [phone]  Physical Therapy Treatment (Re-Assessment)  Patient Details  Name: Heather Gilmore MRN: [id number] Date of Birth: 08/27/1929 Referring Provider: Teresa Coombs   Encounter Date: 04/14/2016      PT End of Session - 04/14/16 1615    Visit Number 11   Number of Visits 19   Date for PT Re-Evaluation 05/12/16   Authorization Type [employer]  (G-codes done 11th session)   Authorization Time Period 0/97/35 to 11/04/90; recert done 9/7   Authorization - Visit Number 11   Authorization - Number of Visits 21   PT Start Time 1520   PT Stop Time 1608   PT Time Calculation (min) 48 min   Activity Tolerance Patient tolerated treatment well   Behavior During Therapy [facility] for tasks assessed/performed      Past Medical History:  Diagnosis Date  . Arthritis   . Asthma   . Essential tremor   . 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 Hyperlipidemia   . Hypertension   . IBS (irritable bowel syndrome)   . Parkinson disease ([place])   . Rosacea   . Thyroid nodule     Past Surgical History:  Procedure Laterality Date  . APPENDECTOMY    . CATARACT EXTRACTION W/PHACO  05/03/2011   Procedure: CATARACT EXTRACTION PHACO AND INTRAOCULAR LENS PLACEMENT (IOC);  Surgeon: Elta Guadeloupe T. Gershon Crane;  Location: [facility];  Service: Ophthalmology;  Laterality: Right;  CDE: 7.52  . CATARACT EXTRACTION W/PHACO  05/17/2011   Procedure: CATARACT EXTRACTION PHACO AND INTRAOCULAR LENS PLACEMENT (IOC);  Surgeon: Elta Guadeloupe T. Gershon Crane;  Location: [facility];  Service: Ophthalmology;  Laterality: Left;  CDE 9.49  . CHOLECYSTECTOMY  6 yrs ago   aph-Dr Arnoldo Morale  . ESOPHAGEAL DILATION N/A 03/02/2016   Procedure: ESOPHAGEAL DILATION;  Surgeon: Rogene Houston, MD;  Location: [facility];  Service: Endoscopy;  Laterality: N/A;  . ESOPHAGOGASTRODUODENOSCOPY N/A 03/02/2016   Procedure:  ESOPHAGOGASTRODUODENOSCOPY (EGD);  Surgeon: Rogene Houston, MD;  Location: [facility];  Service: Endoscopy;  Laterality: N/A;  1:25  . TONSILLECTOMY     as child    There were no vitals filed for this visit.      Subjective Assessment - 04/14/16 1522    Subjective Patient arrives today after being out for 2 weeks with no skilled PT services; she reports she has no good reason for being out so long/not scheduling more sessions, she just wanted a break. Nothing major has been going on, no falls or close calls. Her R hip has been acting up a bit as well recently, about 2 weeks ago now. She does not note that big of a change compared to when she started. She rates herself as being 80/100 on a subjective scale with  her not really being sure of the last 20%. She did have a close call with a fall due to tripping on her own foot.    Pertinent History HTN, history of syncope, asthma, IBS    How long can you sit comfortably? 9/7- unlimited    How long can you stand comfortably? 9/7- 5 minutes, has not tried any longer    How long can you walk comfortably? 9/7- not sure but she is walking further than she used to   Patient Stated Goals get balance better, prevent more falls    Currently in Pain? Yes  Pain Score 5    Pain Location Hip   Pain Orientation Right   Pain Descriptors / Indicators Discomfort   Pain Type Chronic pain   Pain Radiating Towards down anterior R thigh    Pain Onset More than a month ago   Pain Frequency Intermittent   Aggravating Factors  trying to get up, certain movements, walking too long    Pain Relieving Factors rubbing area, walking    Effect of Pain on Daily Activities limits activity             [facility] PT Assessment - 04/14/16 0001      Assessment   Medical Diagnosis parkinsons and R hip paiin    Referring Provider Teresa Coombs    Onset Date/Surgical Date --  parkinsons 2 years ago, R hip last november    Next MD Visit Dr. Wolfgang Phoenix in October; has been  released from Dr. Paulla Fore      Balance Screen   Has the patient fallen in the past 6 months Yes   How many times? 3-4    Has the patient had a decrease in activity level because of a fear of falling?  No   Is the patient reluctant to leave their home because of a fear of falling?  No     Prior Function   Level of Independence Independent;Independent with basic ADLs;Independent with gait;Independent with transfers   Vocation Retired   Leisure none      Observation/Other Assessments   Observations BP 194/90 per auto-cuff; however 172/80 per manual cuff; pulse 67 and O2 97% RA      Strength   Right Hip Flexion 4+/5   Right Hip ABduction 3+/5   Left Hip Flexion 5/5   Left Hip ABduction 3+/5   Right Knee Flexion 4+/5   Right Knee Extension 4/5   Left Knee Flexion 4+/5   Left Knee Extension 4+/5   Right Ankle Dorsiflexion 5/5   Left Ankle Dorsiflexion 5/5     6 minute walk test results    Aerobic Endurance Distance Walked 475   Endurance additional comments 3MWT      Berg Balance Test   Sit to Stand Able to stand without using hands and stabilize independently   Standing Unsupported Able to stand safely 2 minutes   Sitting with Back Unsupported but Feet Supported on Floor or Stool Able to sit safely and securely 2 minutes   Stand to Sit Sits safely with minimal use of hands   Transfers Able to transfer safely, minor use of hands   Standing Unsupported with Eyes Closed Able to stand 10 seconds safely   Standing Ubsupported with Feet Together Able to place feet together independently and stand 1 minute safely   From Standing, Reach Forward with Outstretched Arm Can reach confidently >25 cm (10")   From Standing Position, Pick up Object from Floor Able to pick up shoe safely and easily   From Standing Position, Turn to Look Behind Over each Shoulder Looks behind one side only/other side shows less weight shift   Turn 360 Degrees Able to turn 360 degrees safely but slowly   Standing  Unsupported, Alternately Place Feet on Step/Stool Able to complete >2 steps/needs minimal assist   Standing Unsupported, One Foot in Front Able to take small step independently and hold 30 seconds   Standing on One Leg Able to lift leg independently and hold 5-10 seconds   Total Score 47  PT Education - 04/14/16 1612    Education provided Yes   Education Details progress with skilled PT services; extensive and exhaustive education regarding importance of regular physical activity and the effect of her 2 weeks of inactivity on her R hip pain, balance, fall risk, mobility, and parkinsons disease; strong encouragement to continue with skilled PT services as well as joining and regularly attending activities at senior center; check with MD regarding her high SBP today in face of medication change/BP response to activity/resting values    Person(s) Educated Patient   Methods Explanation   Comprehension Verbalized understanding          PT Short Term Goals - 04/14/16 1601      PT SHORT TERM GOAL #1   Title Patient will demonstrate improved gait mechanics in forward and backwards directions, including improved posture, step lengths, foot clearance and no drifting to either side in order to improve mobilty and reduce fall risk   Baseline 9/7- much improved, continues to drift    Time 4   Period Weeks   Status Partially Met     PT SHORT TERM GOAL #2   Title Patient willl be able to tolerate at least 15 minutes of weight bearing with no increased pain in order to improve function within the community and at home    Baseline 9/7- had regularly been doing 45 minutes sessions, DNT measure this session    Time 4   Period Weeks   Status On-going     PT SHORT TERM GOAL #3   Title Patient will demonstrate improved posture during all functional situations in order to reduce fall risk and assist in improving overall mobility    Time 4   Period Weeks    Status On-going     PT SHORT TERM GOAL #4   Title Patient will experience no more than 1/10 pain in her R hip in order to demonstrate overall improvement of condition and improve QOL    Baseline 9/7- R hip has flared up again likely due to inactivity    Time 4   Period Weeks   Status On-going     PT SHORT TERM GOAL #5   Title Patient will be independent in correctly and consistently performing appropriate HEP, to be updated PRN    Baseline 9/7- has been relatively inactive the past couple weeks    Time 4   Period Weeks   Status On-going           PT Long Term Goals - 04/14/16 1603      PT LONG TERM GOAL #1   Title Patient will demonstrate strength 5/5 in all tested muscle groups in order to reduce pain and improve function    Time 8   Period Weeks   Status On-going     PT LONG TERM GOAL #2   Title Patient will score at least 51 on BERG balance test in order to show improved balance/reduced fall risk    Time 8   Period Weeks   Status On-going     PT LONG TERM GOAL #3   Title Patient to be able to ambulate at least 644f during 3MWT in order to demonstrate improved mobilty for home and community ambulation    Time 8   Period Weeks   Status On-going     PT LONG TERM GOAL #4   Title Patient to be participatory in regular exercise at senior center, at least 3 days per week, in order  to improve QOL and assist in maintaining regular exercise habit    Time 8   Period Weeks   Status On-going     PT LONG TERM GOAL #5   Title Patient to be able to tolerate weight bearing at least 30-45 minutes with no exacerbation of pain in order to improve function and QOL    Time 8   Period Weeks   Status On-going               Plan - 05/03/2016 1616    Clinical Impression Statement Re-assessment performed today. Patient has been absent from skilled PT services for the past two weeks, and demonstrates decline in some objective measures/subjective reports of performance as a  result; patient unable to identify any reason why she has not come to PT or remained active on her own, stating "I don't know, I'm just lazy". Provided extensive and exhaustive education regarding findings today and effect of inactivity on all aspects of physical performance; see education section of note for specific details. At this point recommend skilled PT services in order to continue addressing functional impairments and to promote regular physical activity program for maintenance of parkinsonian symptoms; patient hesitant/reluctant to continue past already scheduled appointments despite clear physical decline during past 2 weeks of inactivity but open to considering this POC over the weekend and making a final decision next week.    Rehab Potential Good   PT Frequency 2x / week   PT Duration 4 weeks   PT Treatment/Interventions ADLs/Self Care Home Management;Cryotherapy;Moist Heat;Gait training;Stair training;Functional mobility training;Therapeutic activities;Therapeutic exercise;Balance training;Neuromuscular re-education;Patient/family education;Manual techniques;Passive range of motion;Energy conservation;Taping   PT Next Visit Plan continue working on dynamic activities and challenges to multi-tasking/coordination; functional activity tolerance. Discuss local support groups and activities.    PT Home Exercise Plan will give next session    Consulted and Agree with Plan of Care Patient      Patient will benefit from skilled therapeutic intervention in order to improve the following deficits and impairments:  Abnormal gait, Hypomobility, Decreased activity tolerance, Decreased strength, Pain, Decreased balance, Decreased mobility, Difficulty walking, Improper body mechanics, Decreased coordination, Impaired flexibility, Postural dysfunction  Visit Diagnosis: Difficulty in walking, not elsewhere classified - Plan: PT plan of care cert/re-cert  Unsteadiness on feet - Plan: PT plan of care  cert/re-cert  Pain in right hip - Plan: PT plan of care cert/re-cert  Muscle weakness (generalized) - Plan: PT plan of care cert/re-cert  Other symptoms and signs involving the musculoskeletal system - Plan: PT plan of care cert/re-cert       G-Codes - May 03, 2016 1621    Functional Assessment Tool Used Based on skilled clinical assessment of balance, strength, posture, gait, fall risk    Functional Limitation Mobility: Walking and moving around   Mobility: Walking and Moving Around Current Status ([id number]) At least 40 percent but less than 60 percent impaired, limited or restricted   Mobility: Walking and Moving Around Goal Status [phone]) At least 40 percent but less than 60 percent impaired, limited or restricted      Problem List Patient Active Problem List   Diagnosis Date Noted  . Urge incontinence 12/19/2013  . 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 Parkinson's disease ([place]) 05/28/2013  . Essential hypertension 06/09/2009  . Asthma 06/09/2009  . GERD 06/09/2009  . SYNCOPE 06/09/2009  . Abnormal involuntary movement 06/09/2009    Deniece Ree PT, DPT [place],  [place], [address] Phone: [phone]   Fax:  [phone]  Name: Heather Gilmore MRN: [id number] Date of Birth: [birth year]

## 2016-04-19 ENCOUNTER — Ambulatory Visit (HOSPITAL_COMMUNITY): Payer: PPO | Admitting: Physical Therapy

## 2016-04-19 DIAGNOSIS — R262 Difficulty in walking, not elsewhere classified: Secondary | ICD-10-CM | POA: Diagnosis not present

## 2016-04-19 DIAGNOSIS — M6281 Muscle weakness (generalized): Secondary | ICD-10-CM

## 2016-04-19 DIAGNOSIS — R29898 Other symptoms and signs involving the musculoskeletal system: Secondary | ICD-10-CM

## 2016-04-19 DIAGNOSIS — M25551 Pain in right hip: Secondary | ICD-10-CM

## 2016-04-19 DIAGNOSIS — R2681 Unsteadiness on feet: Secondary | ICD-10-CM

## 2016-04-19 NOTE — Therapy (Signed)
[place], [place], [address] Phone: [phone]   Fax:  [phone]  Physical Therapy Treatment  Patient Details  Name: Heather Gilmore MRN: [id number] Date of Birth: 12-08-1929 Referring Provider: Teresa Coombs   Encounter Date: 04/19/2016      PT End of Session - 04/19/16 1608    Visit Number 12   Number of Visits 19   Date for PT Re-Evaluation 05/12/16   Authorization Type [employer]  (G-codes done 11th session)   Authorization Time Period 11/19/22 to 11/06/00; recert done 9/7   Authorization - Visit Number 12   Authorization - Number of Visits 21   PT Start Time 1519   PT Stop Time 1552  8 minutes on Nustep, not included in billing    PT Time Calculation (min) 33 min   Activity Tolerance Patient tolerated treatment well   Behavior During Therapy [facility] for tasks assessed/performed      Past Medical History:  Diagnosis Date  . Arthritis   . Asthma   . Essential tremor   . 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 Hyperlipidemia   . Hypertension   . IBS (irritable bowel syndrome)   . Parkinson disease ([place])   . Rosacea   . Thyroid nodule     Past Surgical History:  Procedure Laterality Date  . APPENDECTOMY    . CATARACT EXTRACTION W/PHACO  05/03/2011   Procedure: CATARACT EXTRACTION PHACO AND INTRAOCULAR LENS PLACEMENT (IOC);  Surgeon: Elta Guadeloupe T. Gershon Crane;  Location: [facility];  Service: Ophthalmology;  Laterality: Right;  CDE: 7.52  . CATARACT EXTRACTION W/PHACO  05/17/2011   Procedure: CATARACT EXTRACTION PHACO AND INTRAOCULAR LENS PLACEMENT (IOC);  Surgeon: Elta Guadeloupe T. Gershon Crane;  Location: [facility];  Service: Ophthalmology;  Laterality: Left;  CDE 9.49  . CHOLECYSTECTOMY  6 yrs ago   aph-Dr Arnoldo Morale  . ESOPHAGEAL DILATION N/A 03/02/2016   Procedure: ESOPHAGEAL DILATION;  Surgeon: Rogene Houston, MD;  [facility];  Service: Endoscopy;  Laterality: N/A;  . ESOPHAGOGASTRODUODENOSCOPY N/A  03/02/2016   Procedure: ESOPHAGOGASTRODUODENOSCOPY (EGD);  Surgeon: Rogene Houston, MD;  [facility];  Service: Endoscopy;  Laterality: N/A;  1:25  . TONSILLECTOMY     as child    There were no vitals filed for this visit.      Subjective Assessment - 04/19/16 1522    Subjective Patient arrives today reporting that her stomach was upset this morning but she is feeling better; she denies wanting to participate in speech or occupational therapy right now.  Her BP is still staying high as well. She does not want to extend PT right now. The last time she checked it it was 172 as the top number, cannot remember bottom number.    Pertinent History HTN, history of syncope, asthma, IBS    Patient Stated Goals get balance better, prevent more falls    Currently in Pain? No/denies            [facility] PT Assessment - 04/19/16 0001      Observation/Other Assessments   Observations BP 170/90 per manual cuff                      [facility] Adult PT Treatment/Exercise - 04/19/16 0001      Therapeutic Activites    Other Therapeutic Activities DGI activities x2 laps, min guard for safety; cross-midline coordination activity involving combination of boom whackers  and stepping in narrow BOS, min guard to min(A) for balance       Knee/Hip Exercises: Aerobic   Nustep 8 minutes Nustep, level 0 no hills   not included in billing                 PT Education - 04/19/16 1607    Education provided Yes   Education Details education regarding opportunities/possible benefit of continuing with PT, trying OT/speech for PD; importance of regular physical activity after DC from PT and up to next session next week   Person(s) Educated Patient   Methods Explanation   Comprehension Verbalized understanding          PT Short Term Goals - 04/14/16 1601      PT SHORT TERM GOAL #1   Title Patient will demonstrate improved gait mechanics in forward and backwards directions,  including improved posture, step lengths, foot clearance and no drifting to either side in order to improve mobilty and reduce fall risk   Baseline 9/7- much improved, continues to drift    Time 4   Period Weeks   Status Partially Met     PT SHORT TERM GOAL #2   Title Patient willl be able to tolerate at least 15 minutes of weight bearing with no increased pain in order to improve function within the community and at home    Baseline 9/7- had regularly been doing 45 minutes sessions, DNT measure this session    Time 4   Period Weeks   Status On-going     PT SHORT TERM GOAL #3   Title Patient will demonstrate improved posture during all functional situations in order to reduce fall risk and assist in improving overall mobility    Time 4   Period Weeks   Status On-going     PT SHORT TERM GOAL #4   Title Patient will experience no more than 1/10 pain in her R hip in order to demonstrate overall improvement of condition and improve QOL    Baseline 9/7- R hip has flared up again likely due to inactivity    Time 4   Period Weeks   Status On-going     PT SHORT TERM GOAL #5   Title Patient will be independent in correctly and consistently performing appropriate HEP, to be updated PRN    Baseline 9/7- has been relatively inactive the past couple weeks    Time 4   Period Weeks   Status On-going           PT Long Term Goals - 04/14/16 1603      PT LONG TERM GOAL #1   Title Patient will demonstrate strength 5/5 in all tested muscle groups in order to reduce pain and improve function    Time 8   Period Weeks   Status On-going     PT LONG TERM GOAL #2   Title Patient will score at least 51 on BERG balance test in order to show improved balance/reduced fall risk    Time 8   Period Weeks   Status On-going     PT LONG TERM GOAL #3   Title Patient to be able to ambulate at least 634f during 3MWT in order to demonstrate improved mobilty for home and community ambulation    Time 8    Period Weeks   Status On-going     PT LONG TERM GOAL #4   Title Patient to be participatory in regular exercise at senior center,  at least 3 days per week, in order to improve QOL and assist in maintaining regular exercise habit    Time 8   Period Weeks   Status On-going     PT LONG TERM GOAL #5   Title Patient to be able to tolerate weight bearing at least 30-45 minutes with no exacerbation of pain in order to improve function and QOL    Time 8   Period Weeks   Status On-going               Plan - 04/19/16 1609    Clinical Impression Statement Patient arrives today reporting she thought about it and she does not want to continue with skilled PT services, denies interest in starting OT/speech therapy as well. Performed aerobic exercise on Nustep (not included in billing), also educated regarding importance of regular, physical activity moving forwards. Otherwise performed dynamic activities in standing, including DGI based tasks and dynamic coordination gait task with combination of cross-midline reaching/stepping. Patient did require min guard/min(A) for safety/fall prevention today.    Rehab Potential Good   PT Frequency 2x / week   PT Duration 4 weeks   PT Treatment/Interventions ADLs/Self Care Home Management;Cryotherapy;Moist Heat;Gait training;Stair training;Functional mobility training;Therapeutic activities;Therapeutic exercise;Balance training;Neuromuscular re-education;Patient/family education;Manual techniques;Passive range of motion;Energy conservation;Taping   PT Next Visit Plan continue working on dynamic activities and challenges to multi-tasking/coordination; functional activity tolerance. Discuss local support groups and activities.    Recommended Other Services recommended skilled parkinsons OT/speech therapy, however patient refuses at this time (04/19/16)   Consulted and Agree with Plan of Care Patient      Patient will benefit from skilled therapeutic  intervention in order to improve the following deficits and impairments:  Abnormal gait, Hypomobility, Decreased activity tolerance, Decreased strength, Pain, Decreased balance, Decreased mobility, Difficulty walking, Improper body mechanics, Decreased coordination, Impaired flexibility, Postural dysfunction  Visit Diagnosis: Difficulty in walking, not elsewhere classified  Unsteadiness on feet  Pain in right hip  Muscle weakness (generalized)  Other symptoms and signs involving the musculoskeletal system     Problem List Patient Active Problem List   Diagnosis Date Noted  . Urge incontinence 12/19/2013  . 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 Parkinson's disease ([place]) 05/28/2013  . Essential hypertension 06/09/2009  . Asthma 06/09/2009  . GERD 06/09/2009  . SYNCOPE 06/09/2009  . Abnormal involuntary movement 06/09/2009    Deniece Ree PT, DPT [place] [address] [place], [place], [address] Phone: [phone]   Fax:  [phone]  Name: ISHITA MCNERNEY MRN: [id number] Date of Birth: 19-Apr-1930

## 2016-04-26 ENCOUNTER — Ambulatory Visit (HOSPITAL_COMMUNITY): Payer: PPO | Admitting: Physical Therapy

## 2016-04-26 DIAGNOSIS — R29898 Other symptoms and signs involving the musculoskeletal system: Secondary | ICD-10-CM

## 2016-04-26 DIAGNOSIS — R262 Difficulty in walking, not elsewhere classified: Secondary | ICD-10-CM | POA: Diagnosis not present

## 2016-04-26 DIAGNOSIS — M6281 Muscle weakness (generalized): Secondary | ICD-10-CM

## 2016-04-26 DIAGNOSIS — M25551 Pain in right hip: Secondary | ICD-10-CM

## 2016-04-26 DIAGNOSIS — R2681 Unsteadiness on feet: Secondary | ICD-10-CM

## 2016-04-26 NOTE — Therapy (Signed)
[place], [place], [address] Phone: [phone]   Fax:  [phone]  Physical Therapy Treatment  Patient Details  Name: Heather Gilmore MRN: [id number] Date of Birth: 1930/05/15 Referring Provider: Teresa Coombs   Encounter Date: 04/26/2016      PT End of Session - 04/26/16 1802    Visit Number 13   Number of Visits 19   Date for PT Re-Evaluation 05/12/16   Authorization Type [employer]  (G-codes done 11th session)   Authorization Time Period 02/17/44 to 03/16/97; recert done 9/7   Authorization - Visit Number 13   Authorization - Number of Visits 21   PT Start Time 1526  Nustep, not included in billing    PT Stop Time 1600   PT Time Calculation (min) 34 min   Activity Tolerance Patient tolerated treatment well   Behavior During Therapy [facility] for tasks assessed/performed      Past Medical History:  Diagnosis Date  . Arthritis   . Asthma   . Essential tremor   . 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 Hyperlipidemia   . Hypertension   . IBS (irritable bowel syndrome)   . Parkinson disease ([place])   . Rosacea   . Thyroid nodule     Past Surgical History:  Procedure Laterality Date  . APPENDECTOMY    . CATARACT EXTRACTION W/PHACO  05/03/2011   Procedure: CATARACT EXTRACTION PHACO AND INTRAOCULAR LENS PLACEMENT (IOC);  Surgeon: Elta Guadeloupe T. Gershon Crane;  Location: [facility];  Service: Ophthalmology;  Laterality: Right;  CDE: 7.52  . CATARACT EXTRACTION W/PHACO  05/17/2011   Procedure: CATARACT EXTRACTION PHACO AND INTRAOCULAR LENS PLACEMENT (IOC);  Surgeon: Elta Guadeloupe T. Gershon Crane;  Location: [facility];  Service: Ophthalmology;  Laterality: Left;  CDE 9.49  . CHOLECYSTECTOMY  6 yrs ago   aph-Dr Arnoldo Morale  . ESOPHAGEAL DILATION N/A 03/02/2016   Procedure: ESOPHAGEAL DILATION;  Surgeon: Rogene Houston, MD;  [facility];  Service: Endoscopy;  Laterality: N/A;  . ESOPHAGOGASTRODUODENOSCOPY N/A 03/02/2016   Procedure: ESOPHAGOGASTRODUODENOSCOPY (EGD);  Surgeon: Rogene Houston, MD;  [facility];  Service: Endoscopy;  Laterality: N/A;  1:25  . TONSILLECTOMY     as child    There were no vitals filed for this visit.      Subjective Assessment - 04/26/16 1800    Subjective Patient reports she is doing well today, she has been feeling more woozy than usual and her BP has been high recently though    Pertinent History HTN, history of syncope, asthma, IBS    Patient Stated Goals get balance better, prevent more falls    Currently in Pain? No/denies            [facility] PT Assessment - 04/26/16 0001      Observation/Other Assessments   Observations BP 140/80 per manual cuff                      [facility] Adult PT Treatment/Exercise - 04/26/16 0001      Knee/Hip Exercises: Aerobic   Nustep 8 minutes Nustep, level 0 no hills   not included in billing      Knee/Hip Exercises: Standing   SLS 3x10 seconds    Gait Training PWR step over hurdles 3 and 6 inch    Other Standing Knee Exercises forwards and backwards gait with boom whackers cross midilne reaching  PT Education - 04/26/16 1802    Education provided Yes   Education Details education regarding BP measures, possible effects on woozy feeling she experiences, importance of regular activity    Person(s) Educated Patient   Methods Explanation   Comprehension Verbalized understanding          PT Short Term Goals - 04/14/16 1601      PT SHORT TERM GOAL #1   Title Patient will demonstrate improved gait mechanics in forward and backwards directions, including improved posture, step lengths, foot clearance and no drifting to either side in order to improve mobilty and reduce fall risk   Baseline 9/7- much improved, continues to drift    Time 4   Period Weeks   Status Partially Met     PT SHORT TERM GOAL #2   Title Patient willl be able to tolerate at least 15 minutes of weight bearing  with no increased pain in order to improve function within the community and at home    Baseline 9/7- had regularly been doing 45 minutes sessions, DNT measure this session    Time 4   Period Weeks   Status On-going     PT SHORT TERM GOAL #3   Title Patient will demonstrate improved posture during all functional situations in order to reduce fall risk and assist in improving overall mobility    Time 4   Period Weeks   Status On-going     PT SHORT TERM GOAL #4   Title Patient will experience no more than 1/10 pain in her R hip in order to demonstrate overall improvement of condition and improve QOL    Baseline 9/7- R hip has flared up again likely due to inactivity    Time 4   Period Weeks   Status On-going     PT SHORT TERM GOAL #5   Title Patient will be independent in correctly and consistently performing appropriate HEP, to be updated PRN    Baseline 9/7- has been relatively inactive the past couple weeks    Time 4   Period Weeks   Status On-going           PT Long Term Goals - 04/14/16 1603      PT LONG TERM GOAL #1   Title Patient will demonstrate strength 5/5 in all tested muscle groups in order to reduce pain and improve function    Time 8   Period Weeks   Status On-going     PT LONG TERM GOAL #2   Title Patient will score at least 51 on BERG balance test in order to show improved balance/reduced fall risk    Time 8   Period Weeks   Status On-going     PT LONG TERM GOAL #3   Title Patient to be able to ambulate at least 623f during 3MWT in order to demonstrate improved mobilty for home and community ambulation    Time 8   Period Weeks   Status On-going     PT LONG TERM GOAL #4   Title Patient to be participatory in regular exercise at senior center, at least 3 days per week, in order to improve QOL and assist in maintaining regular exercise habit    Time 8   Period Weeks   Status On-going     PT LONG TERM GOAL #5   Title Patient to be able to tolerate  weight bearing at least 30-45 minutes with no exacerbation of pain in order to improve  function and QOL    Time 8   Period Weeks   Status On-going               Plan - 04/26/16 1803    Clinical Impression Statement Took BP at beginning of session, finding it to be 140/80; began session with Nustep (not included in billing) and then proceeded with general coordination and balance exercises today. Continue to note that patient is limited by fatigue and does require cues for step length and posture during coordination activities. Min guard for all balance based tasks today. Patient continues to state that she only wants to perform a couple more sessions of PT.    Rehab Potential Good   PT Frequency 2x / week   PT Duration 4 weeks   PT Treatment/Interventions ADLs/Self Care Home Management;Cryotherapy;Moist Heat;Gait training;Stair training;Functional mobility training;Therapeutic activities;Therapeutic exercise;Balance training;Neuromuscular re-education;Patient/family education;Manual techniques;Passive range of motion;Energy conservation;Taping   PT Next Visit Plan continue working on dynamic activities and challenges to multi-tasking/coordination; functional activity tolerance. Discuss local support groups and activities.    Consulted and Agree with Plan of Care Patient      Patient will benefit from skilled therapeutic intervention in order to improve the following deficits and impairments:  Abnormal gait, Hypomobility, Decreased activity tolerance, Decreased strength, Pain, Decreased balance, Decreased mobility, Difficulty walking, Improper body mechanics, Decreased coordination, Impaired flexibility, Postural dysfunction  Visit Diagnosis: Difficulty in walking, not elsewhere classified  Unsteadiness on feet  Pain in right hip  Muscle weakness (generalized)  Other symptoms and signs involving the musculoskeletal system     Problem List Patient Active Problem List    Diagnosis Date Noted  . Urge incontinence 12/19/2013  . 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 Parkinson's disease ([place]) 05/28/2013  . Essential hypertension 06/09/2009  . Asthma 06/09/2009  . GERD 06/09/2009  . SYNCOPE 06/09/2009  . Abnormal involuntary movement 06/09/2009    Deniece Ree PT, DPT [place] [address] [place], [place], [address] Phone: [phone]   Fax:  [phone]  Name: Heather Gilmore MRN: [id number] Date of Birth: [birth year]

## 2016-04-28 ENCOUNTER — Ambulatory Visit (HOSPITAL_COMMUNITY): Payer: PPO | Admitting: Physical Therapy

## 2016-04-28 DIAGNOSIS — M25551 Pain in right hip: Secondary | ICD-10-CM

## 2016-04-28 DIAGNOSIS — R2681 Unsteadiness on feet: Secondary | ICD-10-CM

## 2016-04-28 DIAGNOSIS — M6281 Muscle weakness (generalized): Secondary | ICD-10-CM

## 2016-04-28 DIAGNOSIS — R262 Difficulty in walking, not elsewhere classified: Secondary | ICD-10-CM

## 2016-04-28 DIAGNOSIS — R29898 Other symptoms and signs involving the musculoskeletal system: Secondary | ICD-10-CM

## 2016-04-28 NOTE — Therapy (Signed)
[place], [place], [address] Phone: [phone]   Fax:  [phone]  Physical Therapy Treatment  Patient Details  Name: Heather Gilmore MRN: [id number] Date of Birth: 1930/04/26 Referring Provider: Teresa Coombs   Encounter Date: 04/28/2016      PT End of Session - 04/28/16 1612    Visit Number 14   Number of Visits 19   Date for PT Re-Evaluation 05/12/16   Authorization Type [employer]  (G-codes done 11th session)   Authorization Time Period 9/32/67 to 08/31/56; recert done 9/7   Authorization - Visit Number 14   Authorization - Number of Visits 21   PT Start Time 0998  started with 10 minutes on Nustep, not included in billing    PT Stop Time 1557   PT Time Calculation (min) 32 min      Past Medical History:  Diagnosis Date  . Arthritis   . Asthma   . Essential tremor   . 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 Hyperlipidemia   . Hypertension   . IBS (irritable bowel syndrome)   . Parkinson disease ([place])   . Rosacea   . Thyroid nodule     Past Surgical History:  Procedure Laterality Date  . APPENDECTOMY    . CATARACT EXTRACTION W/PHACO  05/03/2011   Procedure: CATARACT EXTRACTION PHACO AND INTRAOCULAR LENS PLACEMENT (IOC);  Surgeon: Elta Guadeloupe T. Gershon Crane;  Location[facility];  Service: Ophthalmology;  Laterality: Right;  CDE: 7.52  . CATARACT EXTRACTION W/PHACO  05/17/2011   Procedure: CATARACT EXTRACTION PHACO AND INTRAOCULAR LENS PLACEMENT (IOC);  Surgeon: Elta Guadeloupe T. Gershon Crane;  Location: [facility];  Service: Ophthalmology;  Laterality: Left;  CDE 9.49  . CHOLECYSTECTOMY  6 yrs ago   aph-Dr Arnoldo Morale  . ESOPHAGEAL DILATION N/A 03/02/2016   Procedure: ESOPHAGEAL DILATION;  Surgeon: Rogene Houston, MD;  [facility];  Service: Endoscopy;  Laterality: N/A;  . ESOPHAGOGASTRODUODENOSCOPY N/A 03/02/2016   Procedure: ESOPHAGOGASTRODUODENOSCOPY (EGD);  Surgeon: Rogene Houston, MD;  [facility];  Service: Endoscopy;  Laterality: N/A;  1:25  . TONSILLECTOMY     as child    There were no vitals filed for this visit.      Subjective Assessment - 04/28/16 1528    Subjective patient arrives stating she is just OK today; she is frustrated as the pharmacy messed up her medication refill and her insurance will not pay for it to correct it for another week. Otherwise OK.    Pertinent History HTN, history of syncope, asthma, IBS    Currently in Pain? No/denies                         [facility] Adult PT Treatment/Exercise - 04/28/16 0001      Ambulation/Gait   Gait Comments DGI activities with min guard, forwards and backwards, cues for step length and posture      Knee/Hip Exercises: Aerobic   Nustep 10 minutes Nustep, level 0 no hills   not included in billing           PWR [facility]) - 04/28/16 1611    PWR! Rock 1x10 standing, cones    PWR! Twist 1x10 standing, cones    PWR Step 8x4, big and small hurdles              PT Education - 04/28/16 1612    Education provided Yes  Education Details importance of regular physical activity, effects of ongoing aerobic training    Person(s) Educated Patient   Methods Explanation   Comprehension Verbalized understanding          PT Short Term Goals - 04/14/16 1601      PT SHORT TERM GOAL #1   Title Patient will demonstrate improved gait mechanics in forward and backwards directions, including improved posture, step lengths, foot clearance and no drifting to either side in order to improve mobilty and reduce fall risk   Baseline 9/7- much improved, continues to drift    Time 4   Period Weeks   Status Partially Met     PT SHORT TERM GOAL #2   Title Patient willl be able to tolerate at least 15 minutes of weight bearing with no increased pain in order to improve function within the community and at home    Baseline 9/7- had regularly been doing 45 minutes sessions, DNT measure this session     Time 4   Period Weeks   Status On-going     PT SHORT TERM GOAL #3   Title Patient will demonstrate improved posture during all functional situations in order to reduce fall risk and assist in improving overall mobility    Time 4   Period Weeks   Status On-going     PT SHORT TERM GOAL #4   Title Patient will experience no more than 1/10 pain in her R hip in order to demonstrate overall improvement of condition and improve QOL    Baseline 9/7- R hip has flared up again likely due to inactivity    Time 4   Period Weeks   Status On-going     PT SHORT TERM GOAL #5   Title Patient will be independent in correctly and consistently performing appropriate HEP, to be updated PRN    Baseline 9/7- has been relatively inactive the past couple weeks    Time 4   Period Weeks   Status On-going           PT Long Term Goals - 04/14/16 1603      PT LONG TERM GOAL #1   Title Patient will demonstrate strength 5/5 in all tested muscle groups in order to reduce pain and improve function    Time 8   Period Weeks   Status On-going     PT LONG TERM GOAL #2   Title Patient will score at least 51 on BERG balance test in order to show improved balance/reduced fall risk    Time 8   Period Weeks   Status On-going     PT LONG TERM GOAL #3   Title Patient to be able to ambulate at least 645f during 3MWT in order to demonstrate improved mobilty for home and community ambulation    Time 8   Period Weeks   Status On-going     PT LONG TERM GOAL #4   Title Patient to be participatory in regular exercise at senior center, at least 3 days per week, in order to improve QOL and assist in maintaining regular exercise habit    Time 8   Period Weeks   Status On-going     PT LONG TERM GOAL #5   Title Patient to be able to tolerate weight bearing at least 30-45 minutes with no exacerbation of pain in order to improve function and QOL    Time 8   Period Weeks   Status On-going  Plan - 04/28/16 1613    Clinical Impression Statement Continued with functional activity tolerance training on Nustep today, progressing her time on machine (not included in billing); otherwise continued with functional PWR activities in standing with min guard in parallel bars, noting improvement in dynamic balance and obstacle clearing skills. While DPT does recommend continuation of skilled PT services, patient reports dis-interest in this, as well as in speech and OT referrals, and requests that next session be her last.    Rehab Potential Good   PT Frequency 2x / week   PT Duration 4 weeks   PT Treatment/Interventions ADLs/Self Care Home Management;Cryotherapy;Moist Heat;Gait training;Stair training;Functional mobility training;Therapeutic activities;Therapeutic exercise;Balance training;Neuromuscular re-education;Patient/family education;Manual techniques;Passive range of motion;Energy conservation;Taping   PT Next Visit Plan Re-assess/DC, give HEP and exercise recommendations    Consulted and Agree with Plan of Care Patient      Patient will benefit from skilled therapeutic intervention in order to improve the following deficits and impairments:  Abnormal gait, Hypomobility, Decreased activity tolerance, Decreased strength, Pain, Decreased balance, Decreased mobility, Difficulty walking, Improper body mechanics, Decreased coordination, Impaired flexibility, Postural dysfunction  Visit Diagnosis: Difficulty in walking, not elsewhere classified  Unsteadiness on feet  Pain in right hip  Muscle weakness (generalized)  Other symptoms and signs involving the musculoskeletal system     Problem List Patient Active Problem List   Diagnosis Date Noted  . Urge incontinence 12/19/2013  . 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 Parkinson's disease ([place]) 05/28/2013  . Essential hypertension 06/09/2009  . Asthma 06/09/2009  . GERD 06/09/2009  . SYNCOPE 06/09/2009  . Abnormal  involuntary movement 06/09/2009    Deniece Ree PT, DPT [place] [address] [place], [place], [address] Phone: [phone]   Fax:  [phone]  Name: Heather Gilmore MRN: [id number] Date of Birth: 1929-09-20

## 2016-05-02 ENCOUNTER — Other Ambulatory Visit: Payer: Self-pay | Admitting: Neurology

## 2016-05-02 DIAGNOSIS — M542 Cervicalgia: Secondary | ICD-10-CM | POA: Diagnosis not present

## 2016-05-02 DIAGNOSIS — G463 Brain stem stroke syndrome: Secondary | ICD-10-CM | POA: Diagnosis not present

## 2016-05-02 DIAGNOSIS — R251 Tremor, unspecified: Secondary | ICD-10-CM | POA: Diagnosis not present

## 2016-05-02 DIAGNOSIS — R471 Dysarthria and anarthria: Secondary | ICD-10-CM

## 2016-05-02 DIAGNOSIS — R2689 Other abnormalities of gait and mobility: Secondary | ICD-10-CM

## 2016-05-02 DIAGNOSIS — R1310 Dysphagia, unspecified: Secondary | ICD-10-CM | POA: Diagnosis not present

## 2016-05-02 DIAGNOSIS — G20 Parkinson's disease: Secondary | ICD-10-CM | POA: Diagnosis not present

## 2016-05-02 DIAGNOSIS — I69922 Dysarthria following unspecified cerebrovascular disease: Secondary | ICD-10-CM | POA: Diagnosis not present

## 2016-05-02 DIAGNOSIS — R260 Ataxic gait: Secondary | ICD-10-CM | POA: Diagnosis not present

## 2016-05-02 DIAGNOSIS — J45909 Unspecified asthma, uncomplicated: Secondary | ICD-10-CM | POA: Diagnosis not present

## 2016-05-02 DIAGNOSIS — I10 Essential (primary) hypertension: Secondary | ICD-10-CM | POA: Diagnosis not present

## 2016-05-02 DIAGNOSIS — G629 Polyneuropathy, unspecified: Secondary | ICD-10-CM | POA: Diagnosis not present

## 2016-05-03 ENCOUNTER — Ambulatory Visit (INDEPENDENT_AMBULATORY_CARE_PROVIDER_SITE_OTHER): Payer: PPO | Admitting: Internal Medicine

## 2016-05-03 ENCOUNTER — Ambulatory Visit (HOSPITAL_COMMUNITY): Payer: PPO | Admitting: Physical Therapy

## 2016-05-03 ENCOUNTER — Encounter (INDEPENDENT_AMBULATORY_CARE_PROVIDER_SITE_OTHER): Payer: Self-pay | Admitting: Internal Medicine

## 2016-05-03 VITALS — BP 100/50 | HR 72 | Temp 97.6°F | Ht 60.0 in | Wt 116.8 lb

## 2016-05-03 DIAGNOSIS — R2681 Unsteadiness on feet: Secondary | ICD-10-CM

## 2016-05-03 DIAGNOSIS — R1310 Dysphagia, unspecified: Secondary | ICD-10-CM

## 2016-05-03 DIAGNOSIS — R262 Difficulty in walking, not elsewhere classified: Secondary | ICD-10-CM | POA: Diagnosis not present

## 2016-05-03 DIAGNOSIS — R29898 Other symptoms and signs involving the musculoskeletal system: Secondary | ICD-10-CM

## 2016-05-03 DIAGNOSIS — M25551 Pain in right hip: Secondary | ICD-10-CM

## 2016-05-03 DIAGNOSIS — M6281 Muscle weakness (generalized): Secondary | ICD-10-CM

## 2016-05-03 NOTE — Therapy (Signed)
[place], [place], [address] Phone: [phone]   Fax:  [phone]  Physical Therapy Treatment (Discharge)  Patient Details  Name: Heather Gilmore MRN: [id number] Date of Birth: 09-Jun-1930 Referring Provider: Teresa Coombs   Encounter Date: 05/03/2016      PT End of Session - 05/03/16 1411    Visit Number 15   Number of Visits 15   Authorization Type [employer]  (G-codes done 15th session)   Authorization Time Period 0/09/23 to 3/00/76; recert done 9/7   Authorization - Visit Number 15   Authorization - Number of Visits 21   PT Start Time 1306   PT Stop Time 1346   PT Time Calculation (min) 40 min   Equipment Utilized During Treatment Gait belt   Activity Tolerance Patient tolerated treatment well;Patient limited by fatigue   Behavior During Therapy [place] for tasks assessed/performed      Past Medical History:  Diagnosis Date  . Arthritis   . Asthma   . Essential tremor   . 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 Hyperlipidemia   . Hypertension   . IBS (irritable bowel syndrome)   . Parkinson disease ([place])   . Rosacea   . Thyroid nodule     Past Surgical History:  Procedure Laterality Date  . APPENDECTOMY    . CATARACT EXTRACTION W/PHACO  05/03/2011   Procedure: CATARACT EXTRACTION PHACO AND INTRAOCULAR LENS PLACEMENT (IOC);  Surgeon: Elta Guadeloupe T. Gershon Crane;  Location: [facility];  Service: Ophthalmology;  Laterality: Right;  CDE: 7.52  . CATARACT EXTRACTION W/PHACO  05/17/2011   Procedure: CATARACT EXTRACTION PHACO AND INTRAOCULAR LENS PLACEMENT (IOC);  Surgeon: Elta Guadeloupe T. Gershon Crane;  Location: [facility];  Service: Ophthalmology;  Laterality: Left;  CDE 9.49  . CHOLECYSTECTOMY  6 yrs ago   aph-Dr Arnoldo Morale  . ESOPHAGEAL DILATION N/A 03/02/2016   Procedure: ESOPHAGEAL DILATION;  Surgeon: Rogene Houston, MD;  Location: [facility];  Service: Endoscopy;  Laterality: N/A;  . ESOPHAGOGASTRODUODENOSCOPY  N/A 03/02/2016   Procedure: ESOPHAGOGASTRODUODENOSCOPY (EGD);  Surgeon: Rogene Houston, MD;  [facility]: [facility];  Service: Endoscopy;  Laterality: N/A;  1:25  . TONSILLECTOMY     as child    There were no vitals filed for this visit.      Subjective Assessment - 05/03/16 1311    Subjective Patient arrives today stating she is determined to make today her last day, "I just can't do it". She does not notice that she is any better with skilled PT services; she reports she has continued to fall into walls/surfaces while walking but has not hit the floor yet. Her hip has been feeling fine. Nothing else major going on.    Pertinent History HTN, history of syncope, asthma, IBS    How long can you sit comfortably? 9/26- unlimited    How long can you stand comfortably? 9/26- 5 minutes (but has particiapted in 45 minute PT session with no problems)   How long can you walk comfortably? 9/26- not walking a whole lot, more than she used to    Patient Stated Goals get balance better, prevent more falls    Currently in Pain? No/denies            [facility] PT Assessment - 05/03/16 0001      Observation/Other Assessments   Observations 111/54 per autocuff, HR 53BPM ; after SOB episode 138/72, 65 BPM no other pertinent symptoms  Strength   Right Hip Flexion 4/5   Right Hip Extension 2/5   Right Hip ABduction 4/5   Left Hip Flexion 4+/5   Left Hip Extension 2/5   Left Hip ABduction 2/5   Right Knee Flexion 4+/5   Right Knee Extension 5/5   Left Knee Flexion 4+/5   Left Knee Extension 5/5   Right Ankle Dorsiflexion 5/5   Left Ankle Dorsiflexion 5/5     6 minute walk test results    Aerobic Endurance Distance Walked 456   Endurance additional comments 3MWT, limited by SOB      Berg Balance Test   Sit to Stand Able to stand without using hands and stabilize independently   Standing Unsupported Able to stand safely 2 minutes   Sitting with Back Unsupported but Feet Supported on Floor  or Stool Able to sit safely and securely 2 minutes   Stand to Sit Sits safely with minimal use of hands   Transfers Able to transfer safely, minor use of hands   Standing Unsupported with Eyes Closed Able to stand 10 seconds safely   Standing Ubsupported with Feet Together Able to place feet together independently and stand 1 minute safely   From Standing, Reach Forward with Outstretched Arm Can reach forward >12 cm safely (5")   From Standing Position, Pick up Object from Floor Able to pick up shoe safely and easily   From Standing Position, Turn to Look Behind Over each Shoulder Looks behind one side only/other side shows less weight shift   Turn 360 Degrees Able to turn 360 degrees safely but slowly   Standing Unsupported, Alternately Place Feet on Step/Stool Able to stand independently and safely and complete 8 steps in 20 seconds   Standing Unsupported, One Foot in Front Needs help to step but can hold 15 seconds   Standing on One Leg Able to lift leg independently and hold equal to or more than 3 seconds   Total Score 47                             PT Education - 05/03/16 1410    Education provided Yes   Education Details importance of regular physical activity, effects of aerobic training and role in managing Parkinsons, progress with skilled PT services/DC today per pateint request; BP vs norms for this vital, exercise response; senior center and [employer]) Educated Patient   Methods Explanation;Handout   Comprehension Verbalized understanding          PT Short Term Goals - 05/03/16 1336      PT SHORT TERM GOAL #1   Title Patient will demonstrate improved gait mechanics in forward and backwards directions, including improved posture, step lengths, foot clearance and no drifting to either side in order to improve mobilty and reduce fall risk   Time 4   Period Weeks   Status Achieved     PT SHORT TERM GOAL #2   Title Patient willl be able to  tolerate at least 15 minutes of weight bearing with no increased pain in order to improve function within the community and at home    Baseline 9/26- has regularly been doing 45 minutes sessions, DNT measure this session   Time 4   Period Weeks   Status On-going     PT SHORT TERM GOAL #3   Title Patient will demonstrate improved posture during all functional situations in order to reduce  fall risk and assist in improving overall mobility    Time 4   Period Weeks   Status On-going     PT SHORT TERM GOAL #4   Title Patient will experience no more than 1/10 pain in her R hip in order to demonstrate overall improvement of condition and improve QOL    Baseline 9/26- 0/10 since resuming exercise with PT    Time 4   Period Weeks   Status Achieved     PT SHORT TERM GOAL #5   Title Patient will be independent in correctly and consistently performing appropriate HEP, to be updated PRN    Baseline 9/26- walks dog around the block    Time 4   Period Weeks   Status Not Met           PT Long Term Goals - 05/03/16 1338      PT LONG TERM GOAL #1   Title Patient will demonstrate strength 5/5 in all tested muscle groups in order to reduce pain and improve function    Time 8   Period Weeks   Status On-going     PT LONG TERM GOAL #2   Title Patient will score at least 51 on BERG balance test in order to show improved balance/reduced fall risk    Baseline 9/26- 47   Time 8   Period Weeks   Status On-going     PT LONG TERM GOAL #3   Title Patient to be able to ambulate at least 636f during 3MWT in order to demonstrate improved mobilty for home and community ambulation    Baseline 9/26- 4573f   Time 8   Period Weeks   Status On-going     PT LONG TERM GOAL #4   Title Patient to be participatory in regular exercise at senior center, at least 3 days per week, in order to improve QOL and assist in maintaining regular exercise habit    Baseline 9/26- not participating      PT LONG TERM  GOAL #5   Title Patient to be able to tolerate weight bearing at least 30-45 minutes with no exacerbation of pain in order to improve function and QOL    Baseline 9/26- has been able to tolerate 45 minutes of activity with PT    Time 8   Period Weeks   Status Achieved               Plan - 05/03/16 1413    Clinical Impression Statement Re-assessment performed today. Patient arrives reporting that her BP has now been low rather than high recently, and she "just can't do this anymore" despite physically feeling better with skilled PT services. Upon examination, patient does not appear to have made significant changes with skilled PT services and did experience an episode of SOB with 3MWT, with no significant changes in vitals noted and all symptoms of which resolved with seated rest. Patient has not made significant changes towards her goals, and shows little to no motivation or interest in continuing with skilled PT services or in performing regular aerobic exercise as prescribed by DPT. DC today due to patient request/lack of interest in continuing, as well as lack of progress at this time.    Rehab Potential Good   PT Next Visit Plan DC today per patient request/lack of progress    PT Home Exercise Plan aerobic regular activity at [facility]/senior center    Consulted and Agree with Plan of Care Patient  Patient will benefit from skilled therapeutic intervention in order to improve the following deficits and impairments:  Abnormal gait, Hypomobility, Decreased activity tolerance, Decreased strength, Pain, Decreased balance, Decreased mobility, Difficulty walking, Improper body mechanics, Decreased coordination, Impaired flexibility, Postural dysfunction  Visit Diagnosis: Difficulty in walking, not elsewhere classified  Unsteadiness on feet  Pain in right hip  Muscle weakness (generalized)  Other symptoms and signs involving the musculoskeletal system       G-Codes - 05-05-2016  1417    Functional Assessment Tool Used Based on skilled clinical assessment of balance, strength, posture, gait, fall risk    Functional Limitation Mobility: Walking and moving around   Mobility: Walking and Moving Around Goal Status [phone]) At least 40 percent but less than 60 percent impaired, limited or restricted   Mobility: Walking and Moving Around Discharge Status [phone]) At least 40 percent but less than 60 percent impaired, limited or restricted      Problem List Patient Active Problem List   Diagnosis Date Noted  . Urge incontinence 12/19/2013  . 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 Parkinson's disease ([place]) 05/28/2013  . Essential hypertension 06/09/2009  . Asthma 06/09/2009  . GERD 06/09/2009  . SYNCOPE 06/09/2009  . Abnormal involuntary movement 06/09/2009   PHYSICAL THERAPY DISCHARGE SUMMARY  Visits from Start of Care: 15  Current functional level related to goals / functional outcomes: Patient requests DC from skilled PT services today; unable to state reasoning for wanting to be discharged other than stating "I'm lazy" or "I just don't feel like I want to do this anymore". No significant chances since last re-assessment. DC today per patient request.    Remaining deficits: Unsteadiness, functional weakness, postural deficits, gait deviations, fall risk    Education / Equipment: importance of regular physical activity, effects of aerobic training and role in managing Parkinsons, progress with skilled PT services/DC today per pateint request; BP vs norms for this vital, exercise response; senior center and [facility]   Plan: Patient agrees to discharge.  Patient goals were partially met. Patient is being discharged due to the patient's request.  ?????       Deniece Ree PT, DPT [place], [place], [address] Phone: [phone]   Fax:  [phone]  Name: Heather Gilmore MRN:  [id number] Date of Birth: April 12, 1930

## 2016-05-03 NOTE — Progress Notes (Signed)
Subjective:    Patient ID: Heather Gilmore, female    DOB: 01/27/1930, 82 y.o.   MRN: [id number]  HPI Here today for f/u after undergoing an EGD in July of this year for dysphagia.  She tells me she is doing well. She did have some difficulty trying to swallow some cornbread a few weeks ago. Swallowing is much better. She eat just about anything she wants. She feels 100% better. Her appetite is okay. She usually has BM every 3-4 day. No melena or  BRRB.   July 2017 EGD/ED Impression:               - Granular, longitudinally marked, white specked                            mucosa in the esophagus. Biopsied to rule out                            eosinophilic esophagitis.                           - Benign-appearing esophageal stenosis. Dilated.                           - 4 cm hiatal hernia.                           - Normal stomach.                           - Normal duodenal bulb and second portion of the                            duodenum. Esophageal biopsy is negative for EoE.  Review of Systems Past Medical History:  Diagnosis Date  . Arthritis   . Asthma   . Essential tremor   . 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 Hyperlipidemia   . Hypertension   . IBS (irritable bowel syndrome)   . Parkinson disease ([place])   . Rosacea   . Thyroid nodule     Past Surgical History:  Procedure Laterality Date  . APPENDECTOMY    . CATARACT EXTRACTION W/PHACO  05/03/2011   Procedure: CATARACT EXTRACTION PHACO AND INTRAOCULAR LENS PLACEMENT (IOC);  Surgeon: Elta Guadeloupe T. Gershon Crane;  Location: [facility];  Service: Ophthalmology;  Laterality: Right;  CDE: 7.52  . CATARACT EXTRACTION W/PHACO  05/17/2011   Procedure: CATARACT EXTRACTION PHACO AND INTRAOCULAR LENS PLACEMENT (IOC);  Surgeon: Elta Guadeloupe T. Gershon Crane;  Location: [facility];  Service: Ophthalmology;  Laterality: Left;  CDE 9.49  . CHOLECYSTECTOMY  6 yrs ago   aph-Dr Arnoldo Morale  . ESOPHAGEAL DILATION N/A 03/02/2016   Procedure: ESOPHAGEAL  DILATION;  Surgeon: Rogene Houston, MD;  [facility];  Service: Endoscopy;  Laterality: N/A;  . ESOPHAGOGASTRODUODENOSCOPY N/A 03/02/2016   Procedure: ESOPHAGOGASTRODUODENOSCOPY (EGD);  Surgeon: Rogene Houston, MD;  [facility];  Service: Endoscopy;  Laterality: N/A;  1:25  . TONSILLECTOMY     as child    Allergies  Allergen Reactions  . Dyazide [Hydrochlorothiazide W-Triamterene]   . Penicillins Other (See Comments)    Reaction: head felt on fire.  Current Outpatient Prescriptions on File Prior to Visit  Medication Sig Dispense Refill  . acetaminophen (TYLENOL) 500 MG tablet Take 500 mg by mouth every 6 (six) hours as needed. For pain     . albuterol (PROVENTIL HFA;VENTOLIN HFA) 108 (90 Base) MCG/ACT inhaler Please dispense two if possible. For shortness of breath 6.7 g 2  . carbidopa-levodopa (SINEMET IR) 25-100 MG tablet TAKE (1) TABLET BY MOUTH (4) TIMES DAILY. 360 tablet 0  . lisinopril (PRINIVIL,ZESTRIL) 10 MG tablet Take 1 tablet (10 mg total) by mouth daily. 30 tablet 5  . oxybutynin (DITROPAN-XL) 5 MG 24 hr tablet Take 1 tablet (5 mg total) by mouth daily. 30 tablet 2  . pantoprazole (PROTONIX) 40 MG tablet Take 1 tablet (40 mg total) by mouth daily. 30 tablet 5  . Vitamin D, Ergocalciferol, (DRISDOL) 50000 UNITS CAPS capsule Take 50,000 Units by mouth every 7 (seven) days.     No current facility-administered medications on file prior to visit.        Objective:   Physical Exam Blood pressure (!) 100/50, pulse 72, temperature 97.6 F (36.4 C), height 5' (1.524 m), weight 116 lb 12.8 oz (53 kg). Alert and oriented. Skin warm and dry. Oral mucosa is moist.   . Sclera anicteric, conjunctivae is pink. Thyroid not enlarged. No cervical lymphadenopathy. Lungs clear. Heart regular rate and rhythm.  Abdomen is soft. Bowel sounds are positive. No hepatomegaly. No abdominal masses felt. No tenderness.  No edema to lower extremities.         Assessment &  Plan:  Dysphagia. She is doing much better since in EGD/ED in July. She will let us know if she has any problems. She will have OV in 6 months.

## 2016-05-03 NOTE — Patient Instructions (Signed)
Continue the Protonix.  OV in 6 months 

## 2016-05-06 ENCOUNTER — Ambulatory Visit (HOSPITAL_COMMUNITY)
Admission: RE | Admit: 2016-05-06 | Discharge: 2016-05-06 | Disposition: A | Discharge status: Home / Self Care | Payer: PPO | Source: Ambulatory Visit | Attending: Neurology | Admitting: Neurology

## 2016-05-06 DIAGNOSIS — R9082 White matter disease, unspecified: Secondary | ICD-10-CM | POA: Diagnosis not present

## 2016-05-06 DIAGNOSIS — R2689 Other abnormalities of gait and mobility: Secondary | ICD-10-CM | POA: Diagnosis not present

## 2016-05-06 DIAGNOSIS — R1310 Dysphagia, unspecified: Secondary | ICD-10-CM | POA: Insufficient documentation

## 2016-05-06 DIAGNOSIS — R471 Dysarthria and anarthria: Secondary | ICD-10-CM | POA: Diagnosis not present

## 2016-05-06 DIAGNOSIS — R269 Unspecified abnormalities of gait and mobility: Secondary | ICD-10-CM | POA: Diagnosis not present

## 2016-05-09 ENCOUNTER — Ambulatory Visit (INDEPENDENT_AMBULATORY_CARE_PROVIDER_SITE_OTHER): Payer: PPO | Admitting: Family Medicine

## 2016-05-09 ENCOUNTER — Encounter: Payer: Self-pay | Admitting: Family Medicine

## 2016-05-09 VITALS — BP 140/60 | Ht 60.0 in | Wt 117.2 lb

## 2016-05-09 DIAGNOSIS — Z23 Encounter for immunization: Secondary | ICD-10-CM

## 2016-05-09 DIAGNOSIS — K219 Gastro-esophageal reflux disease without esophagitis: Secondary | ICD-10-CM | POA: Diagnosis not present

## 2016-05-09 DIAGNOSIS — I10 Essential (primary) hypertension: Secondary | ICD-10-CM | POA: Diagnosis not present

## 2016-05-09 DIAGNOSIS — G20 Parkinson's disease: Secondary | ICD-10-CM | POA: Diagnosis not present

## 2016-05-09 DIAGNOSIS — J4520 Mild intermittent asthma, uncomplicated: Secondary | ICD-10-CM

## 2016-05-09 MED ORDER — ONDANSETRON 4 MG PO TBDP: ORAL_TABLET | ORAL | 1 refills | Status: DC

## 2016-05-09 MED ORDER — LISINOPRIL 2.5 MG PO TABS: 2.5000 mg | ORAL_TABLET | Freq: Every day | ORAL | 1 refills | Status: DC

## 2016-05-09 NOTE — Progress Notes (Signed)
   Subjective:    Patient ID: Heather Gilmore, female    DOB: 1930-05-25, 82 y.o.   MRN: [id number]  Hypertension  This is a chronic problem. The current episode started more than 1 year ago. There are no compliance problems.   Blood pressure medicine and blood pressure levels reviewed today with patient. Compliant with blood pressure medicine. States does not miss a dose. No obvious side effects. Blood pressure generally good when checked elsewhere. Watching salt intake.  Pt felt quazzy this morn but no actual vomiting  astma overall sable, using inhaler a bit more the pas t few weeks, felt a little congeste off and on Patient states no other concerns this visit.    Review of Systems No headache, no major weight loss or weight gain, no chest pain no back pain abdominal pain no change in bowel habits complete ROS otherwise negative     Objective:   Physical Exam  Blood pressure 110/70 on repeat alert patient oriented today lungs mild wheezes with deep breaths otherwise clear. Heart regular rate and rhythm. Tremor evident. Slow ambulation. Walking with assistance of husband. Ankles no significant edema       Assessment & Plan:  Impression 1 hypertension controlled to tight discuss [place] lisinopril from 5 mg down the 2.5 #2 Parkinson's with ongoing challenges. Recent scan and carotid ultrasound by the neurologist, fortunately normal #3 nausea and nonspecific patient has no medications for this #4 asthma suboptimum patient prefers not to start on chronic asthma meds encouraged to use albuterol when necessary plan flu shot today. Adjust meds as noted diet exercise discussed. Asked about physical therapy encouraged to work with her physical therapist and orthopedist in this regard follow-up as scheduled [facility]

## 2016-05-19 DIAGNOSIS — L821 Other seborrheic keratosis: Secondary | ICD-10-CM | POA: Diagnosis not present

## 2016-05-19 DIAGNOSIS — D225 Melanocytic nevi of trunk: Secondary | ICD-10-CM | POA: Diagnosis not present

## 2016-05-19 DIAGNOSIS — T1490 Injury, unspecified: Secondary | ICD-10-CM | POA: Diagnosis not present

## 2016-05-19 DIAGNOSIS — T1490XA Injury, unspecified, initial encounter: Secondary | ICD-10-CM | POA: Diagnosis not present

## 2016-06-06 DIAGNOSIS — R260 Ataxic gait: Secondary | ICD-10-CM | POA: Diagnosis not present

## 2016-06-06 DIAGNOSIS — I10 Essential (primary) hypertension: Secondary | ICD-10-CM | POA: Diagnosis not present

## 2016-06-06 DIAGNOSIS — J45909 Unspecified asthma, uncomplicated: Secondary | ICD-10-CM | POA: Diagnosis not present

## 2016-06-06 DIAGNOSIS — G629 Polyneuropathy, unspecified: Secondary | ICD-10-CM | POA: Diagnosis not present

## 2016-06-06 DIAGNOSIS — G20 Parkinson's disease: Secondary | ICD-10-CM | POA: Diagnosis not present

## 2016-06-06 DIAGNOSIS — R251 Tremor, unspecified: Secondary | ICD-10-CM | POA: Diagnosis not present

## 2016-06-06 DIAGNOSIS — M542 Cervicalgia: Secondary | ICD-10-CM | POA: Diagnosis not present

## 2016-06-06 DIAGNOSIS — R1310 Dysphagia, unspecified: Secondary | ICD-10-CM | POA: Diagnosis not present

## 2016-06-06 DIAGNOSIS — I69922 Dysarthria following unspecified cerebrovascular disease: Secondary | ICD-10-CM | POA: Diagnosis not present

## 2016-06-06 DIAGNOSIS — G463 Brain stem stroke syndrome: Secondary | ICD-10-CM | POA: Diagnosis not present

## 2016-06-16 ENCOUNTER — Ambulatory Visit (HOSPITAL_COMMUNITY)
Admission: RE | Admit: 2016-06-16 | Discharge: 2016-06-16 | Disposition: A | Discharge status: Home / Self Care | Payer: PPO | Source: Ambulatory Visit | Attending: Neurology | Admitting: Neurology

## 2016-06-16 ENCOUNTER — Other Ambulatory Visit (HOSPITAL_COMMUNITY): Payer: Self-pay | Admitting: Hematology and Oncology

## 2016-06-16 ENCOUNTER — Other Ambulatory Visit: Payer: Self-pay | Admitting: Neurology

## 2016-06-16 DIAGNOSIS — K219 Gastro-esophageal reflux disease without esophagitis: Secondary | ICD-10-CM | POA: Insufficient documentation

## 2016-06-16 DIAGNOSIS — G20 Parkinson's disease: Secondary | ICD-10-CM | POA: Diagnosis not present

## 2016-06-16 DIAGNOSIS — I639 Cerebral infarction, unspecified: Secondary | ICD-10-CM

## 2016-06-16 DIAGNOSIS — I10 Essential (primary) hypertension: Secondary | ICD-10-CM | POA: Insufficient documentation

## 2016-06-16 DIAGNOSIS — Z87891 Personal history of nicotine dependence: Secondary | ICD-10-CM | POA: Insufficient documentation

## 2016-06-16 NOTE — Progress Notes (Signed)
*  PRELIMINARY RESULTS* Echocardiogram 2D Echocardiogram has been performed.  Heather Gilmore 06/16/2016, 11:54 AM

## 2016-08-29 DIAGNOSIS — R260 Ataxic gait: Secondary | ICD-10-CM | POA: Diagnosis not present

## 2016-08-29 DIAGNOSIS — I10 Essential (primary) hypertension: Secondary | ICD-10-CM | POA: Diagnosis not present

## 2016-08-29 DIAGNOSIS — I951 Orthostatic hypotension: Secondary | ICD-10-CM | POA: Diagnosis not present

## 2016-08-29 DIAGNOSIS — I69922 Dysarthria following unspecified cerebrovascular disease: Secondary | ICD-10-CM | POA: Diagnosis not present

## 2016-08-29 DIAGNOSIS — G20 Parkinson's disease: Secondary | ICD-10-CM | POA: Diagnosis not present

## 2016-08-29 DIAGNOSIS — G463 Brain stem stroke syndrome: Secondary | ICD-10-CM | POA: Diagnosis not present

## 2016-08-29 DIAGNOSIS — F5101 Primary insomnia: Secondary | ICD-10-CM | POA: Diagnosis not present

## 2016-08-29 DIAGNOSIS — R1310 Dysphagia, unspecified: Secondary | ICD-10-CM | POA: Diagnosis not present

## 2016-08-29 DIAGNOSIS — G629 Polyneuropathy, unspecified: Secondary | ICD-10-CM | POA: Diagnosis not present

## 2016-09-08 DIAGNOSIS — L820 Inflamed seborrheic keratosis: Secondary | ICD-10-CM | POA: Diagnosis not present

## 2016-09-09 ENCOUNTER — Ambulatory Visit: Payer: PPO | Admitting: Family Medicine

## 2016-09-13 ENCOUNTER — Encounter: Payer: Self-pay | Admitting: Family Medicine

## 2016-09-13 ENCOUNTER — Ambulatory Visit (INDEPENDENT_AMBULATORY_CARE_PROVIDER_SITE_OTHER): Payer: PPO | Admitting: Family Medicine

## 2016-09-13 VITALS — BP 122/80 | Ht 60.0 in | Wt 114.0 lb

## 2016-09-13 DIAGNOSIS — G20 Parkinson's disease: Secondary | ICD-10-CM | POA: Diagnosis not present

## 2016-09-13 DIAGNOSIS — I10 Essential (primary) hypertension: Secondary | ICD-10-CM | POA: Diagnosis not present

## 2016-09-13 MED ORDER — CARBIDOPA-LEVODOPA 25-100 MG PO TABS: ORAL_TABLET | ORAL | 1 refills | Status: DC

## 2016-09-13 MED ORDER — LISINOPRIL 2.5 MG PO TABS: 2.5000 mg | ORAL_TABLET | Freq: Every day | ORAL | 1 refills | Status: DC

## 2016-09-13 NOTE — Progress Notes (Signed)
   Subjective:    Patient ID: Heather Gilmore, female    DOB: [birth year], 83 y.o.   MRN: [id number]  Hypertension  This is a new problem. Risk factors for coronary artery disease include post-menopausal state and sedentary lifestyle. Treatments tried: lisinopril. There are no compliance problems.    incr the dose of carbo leva now more nauseaPatient is frustrated by her responsibility Parkinson's medication. Also frustrated by the fact that she continues to worsen despite her medication.  Patient also requests that I review the chart and fine results on a stroke workup to Dr. Elyse Hsu quadrant did a couple months ago. Patient states that she was never informed of the results. Apparently impression some slight weakness in right side of face which led them to do some testing. Patient states she's had this for a long time. Next  She reports ongoing worsening in her gait along with unsteadiness  Blood pressure medicine and blood pressure levels reviewed today with patient. Compliant with blood pressure medicine. States does not miss a dose. No obvious side effects. Blood pressure generally good when checked elsewhere. Watching salt intake.    No recent falls grabbing on to the dor etc  Review of Systems No headache, no major weight loss or weight gain, no chest pain no back pain abdominal pain no change in bowel habits complete ROS otherwise negative     Objective:   Physical Exam  Alert pleasant no acute distress slight weakness right side of face strength otherwise intact lungs clear. Heart regular in rhythm. Positive resting tremor positive cogwheel rigidity positive shuffling gait.      Assessment & Plan:  Impression worsening Parkinson's disease discussed at length #2 stroke workup crowded was negative echocardiogram showed good ejection fraction CT of the brain revealed no obvious stroke all these results shared with patient #3 hypertension good control discussed maintain same meds plan 25  minutes spent greater than 50% in discussion about all these features. Patient considering switching neurologists and a recruiter help to go to Dr. Margette Fast a 1.. Maintain same medications recheck in 6 months WSL

## 2016-09-19 ENCOUNTER — Telehealth: Payer: Self-pay | Admitting: *Deleted

## 2016-09-19 NOTE — Telephone Encounter (Signed)
FYI : Patient states she is not taking the lisinopril anymore and has not been taking it in a good while. Patient states her specialist told her she could stop it. She doesn't want [place] to refill it anymore

## 2016-09-19 NOTE — Telephone Encounter (Signed)
Tell pt ok and be sure to remove from med list

## 2016-10-31 ENCOUNTER — Ambulatory Visit (INDEPENDENT_AMBULATORY_CARE_PROVIDER_SITE_OTHER): Payer: PPO | Admitting: Internal Medicine

## 2016-10-31 ENCOUNTER — Encounter (INDEPENDENT_AMBULATORY_CARE_PROVIDER_SITE_OTHER): Payer: Self-pay | Admitting: Internal Medicine

## 2016-10-31 VITALS — BP 126/82 | HR 68 | Temp 97.6°F | Ht 60.0 in | Wt 116.0 lb

## 2016-10-31 DIAGNOSIS — R1310 Dysphagia, unspecified: Secondary | ICD-10-CM | POA: Diagnosis not present

## 2016-10-31 DIAGNOSIS — R1319 Other dysphagia: Secondary | ICD-10-CM

## 2016-10-31 NOTE — Patient Instructions (Signed)
OV in 1 year.  

## 2016-10-31 NOTE — Progress Notes (Signed)
Subjective:    Patient ID: Heather Gilmore, female    DOB: 04/08/1930, 83 y.o.   MRN: [id number]  HPI Here today for f/u. She was last seen in September of 2017. Hx of dysphagia. She tells me her swallowing is good. Her appetite is good. No acid reflux. She is not having any abdominal pain. She has a BM x 1 a week. No melena or BRRB.  She exercises by walking  July 2017 EGD/ED Impression: - Granular, longitudinally marked, white specked  mucosa in the esophagus. Biopsied to rule out  eosinophilic esophagitis. - Benign-appearing esophageal stenosis. Dilated. - 4 cm hiatal hernia. - Normal stomach. - Normal duodenal bulb and second portion of the  duodenum. Esophageal biopsy is negative for EoE.   Review of Systems Past Medical History:  Diagnosis Date  . Arthritis   . Asthma   . Essential tremor   . 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 Hyperlipidemia   . Hypertension   . IBS (irritable bowel syndrome)   . Parkinson disease ([place])   . Rosacea   . Thyroid nodule     Past Surgical History:  Procedure Laterality Date  . APPENDECTOMY    . CATARACT EXTRACTION W/PHACO  05/03/2011   Procedure: CATARACT EXTRACTION PHACO AND INTRAOCULAR LENS PLACEMENT (IOC);  Surgeon: Elta Guadeloupe T. Gershon Crane;  Location: [facility];  Service: Ophthalmology;  Laterality: Right;  CDE: 7.52  . CATARACT EXTRACTION W/PHACO  05/17/2011   Procedure: CATARACT EXTRACTION PHACO AND INTRAOCULAR LENS PLACEMENT (IOC);  Surgeon: Elta Guadeloupe T. Gershon Crane;  Location: [facility];  Service: Ophthalmology;  Laterality: Left;  CDE 9.49  . CHOLECYSTECTOMY  6 yrs ago   aph-Dr Arnoldo Morale  . ESOPHAGEAL DILATION N/A 03/02/2016   Procedure: ESOPHAGEAL DILATION;  Surgeon: Rogene Houston, MD;  [facility];  Service: Endoscopy;  Laterality:  N/A;  . ESOPHAGOGASTRODUODENOSCOPY N/A 03/02/2016   Procedure: ESOPHAGOGASTRODUODENOSCOPY (EGD);  Surgeon: Rogene Houston, MD;  [facility];  Service: Endoscopy;  Laterality: N/A;  1:25  . TONSILLECTOMY     as child    Allergies  Allergen Reactions  . Dyazide [Hydrochlorothiazide W-Triamterene]   . Penicillins Other (See Comments)    Reaction: head felt on fire.    Current Outpatient Prescriptions on File Prior to Visit  Medication Sig Dispense Refill  . acetaminophen (TYLENOL) 500 MG tablet Take 500 mg by mouth every 6 (six) hours as needed. For pain     . albuterol (PROVENTIL HFA;VENTOLIN HFA) 108 (90 Base) MCG/ACT inhaler Please dispense two if possible. For shortness of breath 6.7 g 2  . carbidopa-levodopa (SINEMET IR) 25-100 MG tablet TAKE (1) TABLET BY MOUTH (4) TIMES DAILY. 360 tablet 1  . Vitamin D, Ergocalciferol, (DRISDOL) 50000 UNITS CAPS capsule Take 50,000 Units by mouth every 7 (seven) days.     No current facility-administered medications on file prior to visit.        Objective:   Physical Exam Blood pressure 126/82, pulse 68, temperature 97.6 F (36.4 C), height 5' (1.524 m), weight 116 lb (52.6 kg). Alert and oriented. Skin warm and dry. Oral mucosa is moist.   . Sclera anicteric, conjunctivae is pink. Thyroid not enlarged. No cervical lymphadenopathy. Bilateral wheezes. Heart regular rate and rhythm.  Abdomen is soft. Bowel sounds are positive. No hepatomegaly. No abdominal masses felt. No tenderness.  1-2 +  edema to lower extremities.          Assessment & Plan:  Dysphagia. She is doing well. She will have OV in 1 year.

## 2016-12-19 DIAGNOSIS — M542 Cervicalgia: Secondary | ICD-10-CM | POA: Diagnosis not present

## 2016-12-19 DIAGNOSIS — G629 Polyneuropathy, unspecified: Secondary | ICD-10-CM | POA: Diagnosis not present

## 2016-12-19 DIAGNOSIS — I10 Essential (primary) hypertension: Secondary | ICD-10-CM | POA: Diagnosis not present

## 2016-12-19 DIAGNOSIS — G20 Parkinson's disease: Secondary | ICD-10-CM | POA: Diagnosis not present

## 2016-12-19 DIAGNOSIS — J45909 Unspecified asthma, uncomplicated: Secondary | ICD-10-CM | POA: Diagnosis not present

## 2016-12-19 DIAGNOSIS — R1310 Dysphagia, unspecified: Secondary | ICD-10-CM | POA: Diagnosis not present

## 2016-12-19 DIAGNOSIS — I69922 Dysarthria following unspecified cerebrovascular disease: Secondary | ICD-10-CM | POA: Diagnosis not present

## 2016-12-19 DIAGNOSIS — R260 Ataxic gait: Secondary | ICD-10-CM | POA: Diagnosis not present

## 2016-12-27 DIAGNOSIS — Z961 Presence of intraocular lens: Secondary | ICD-10-CM | POA: Diagnosis not present

## 2017-01-24 DIAGNOSIS — G4714 Hypersomnia due to medical condition: Secondary | ICD-10-CM | POA: Diagnosis not present

## 2017-01-24 DIAGNOSIS — R1310 Dysphagia, unspecified: Secondary | ICD-10-CM | POA: Diagnosis not present

## 2017-01-24 DIAGNOSIS — G629 Polyneuropathy, unspecified: Secondary | ICD-10-CM | POA: Diagnosis not present

## 2017-01-24 DIAGNOSIS — R260 Ataxic gait: Secondary | ICD-10-CM | POA: Diagnosis not present

## 2017-01-24 DIAGNOSIS — I69922 Dysarthria following unspecified cerebrovascular disease: Secondary | ICD-10-CM | POA: Diagnosis not present

## 2017-01-24 DIAGNOSIS — G20 Parkinson's disease: Secondary | ICD-10-CM | POA: Diagnosis not present

## 2017-01-24 DIAGNOSIS — Z79899 Other long term (current) drug therapy: Secondary | ICD-10-CM | POA: Diagnosis not present

## 2017-01-24 DIAGNOSIS — J45909 Unspecified asthma, uncomplicated: Secondary | ICD-10-CM | POA: Diagnosis not present

## 2017-01-24 DIAGNOSIS — I10 Essential (primary) hypertension: Secondary | ICD-10-CM | POA: Diagnosis not present

## 2017-01-24 DIAGNOSIS — M542 Cervicalgia: Secondary | ICD-10-CM | POA: Diagnosis not present

## 2017-03-13 ENCOUNTER — Encounter: Payer: Self-pay | Admitting: Family Medicine

## 2017-03-13 ENCOUNTER — Ambulatory Visit (INDEPENDENT_AMBULATORY_CARE_PROVIDER_SITE_OTHER): Payer: PPO | Admitting: Family Medicine

## 2017-03-13 VITALS — BP 108/62 | Ht 60.0 in | Wt 101.0 lb

## 2017-03-13 DIAGNOSIS — G20 Parkinson's disease: Secondary | ICD-10-CM

## 2017-03-13 DIAGNOSIS — J4520 Mild intermittent asthma, uncomplicated: Secondary | ICD-10-CM

## 2017-03-13 DIAGNOSIS — I10 Essential (primary) hypertension: Secondary | ICD-10-CM | POA: Diagnosis not present

## 2017-03-13 NOTE — Progress Notes (Signed)
   Subjective:    Patient ID: Heather Gilmore, female    DOB: 05-02-1930, 83 y.o.   MRN: [id number]  Hypertension  This is a recurrent problem. The current episode started more than 1 year ago.  States she has no appetite,and she barely eats.States she was started on Rasagaline 0.5 which Dr. Merlene Laughter started her on in June is making her dizzy, drunk,nauseous.  History of hypertension. Blood pressures generally good when checked elsewhere recently.  Occasional wheezing. Uses albuterol when necessary. Helps things overall.  Very long discussion held about patient's ongoing frustration with her progressively worsening Parkinson's disease. Now has. Challenges Of memory loss generally short-term. Also more emotionally labile both crying and sad. Claims no depression. Frustrated that others seem to be looking at her and thinking less of her because of her neurological challenges. Next  Frustrated about the new medication list Dr. dim quiet start her on. Feeling quite dizzy with this intermittently drunk and nauseated and basically is feeling poorly with it.  No other concerns  Review of Systems No headache, no major weight loss or weight gain, no chest pain no back pain abdominal pain no change in bowel habits complete ROS otherwise negative     Objective:   Physical Exam  Alert and oriented, vitals reviewed and stable, NAD ENT-TM's and ext canals WNL bilat via otoscopic exam Soft palate, tonsils and post pharynx WNL via oropharyngeal exam Neck-symmetric, no masses; thyroid nonpalpable and nontender Pulmonary-no tachypnea or accessory muscle use; Clear without wheezes via auscultation Card--no abnrml murmurs, rhythm reg and rate WNL Carotid pulses symmetric, without bruits Tremor present. Some cogwheel rigidity. Gait observed quite slow and unsteady      Assessment & Plan:  Impression 1 asthma discussed when necessary albuterol No. 2 hypertension history blood pressure now good discussed  often with progressive Parkinson's blood pressure drops along with side effects from Sinemet No. 3 Parkinson's disease with side effects from medications along with frustration about progressive decline. Very long discussion held. Patient reassured and Defense mechanisms discussed.  Greater than 50% of this 25 minute face to face visit was spent in counseling and discussion and coordination of care regarding the above diagnosis/diagnosies

## 2017-03-17 ENCOUNTER — Emergency Department (HOSPITAL_COMMUNITY): Payer: PPO

## 2017-03-17 ENCOUNTER — Emergency Department (HOSPITAL_COMMUNITY)
Admission: EM | Admit: 2017-03-17 | Discharge: 2017-03-17 | Disposition: A | Discharge status: Home / Self Care | Payer: PPO | Attending: Emergency Medicine | Admitting: Emergency Medicine

## 2017-03-17 ENCOUNTER — Encounter (HOSPITAL_COMMUNITY): Payer: Self-pay | Admitting: Emergency Medicine

## 2017-03-17 DIAGNOSIS — R42 Dizziness and giddiness: Secondary | ICD-10-CM | POA: Insufficient documentation

## 2017-03-17 DIAGNOSIS — Z79899 Other long term (current) drug therapy: Secondary | ICD-10-CM | POA: Diagnosis not present

## 2017-03-17 DIAGNOSIS — R110 Nausea: Secondary | ICD-10-CM

## 2017-03-17 DIAGNOSIS — R1110 Vomiting, unspecified: Secondary | ICD-10-CM | POA: Diagnosis not present

## 2017-03-17 DIAGNOSIS — G20 Parkinson's disease: Secondary | ICD-10-CM | POA: Insufficient documentation

## 2017-03-17 DIAGNOSIS — R410 Disorientation, unspecified: Secondary | ICD-10-CM | POA: Insufficient documentation

## 2017-03-17 DIAGNOSIS — R531 Weakness: Secondary | ICD-10-CM | POA: Insufficient documentation

## 2017-03-17 DIAGNOSIS — J45909 Unspecified asthma, uncomplicated: Secondary | ICD-10-CM | POA: Insufficient documentation

## 2017-03-17 DIAGNOSIS — R112 Nausea with vomiting, unspecified: Secondary | ICD-10-CM | POA: Diagnosis not present

## 2017-03-17 DIAGNOSIS — Z87891 Personal history of nicotine dependence: Secondary | ICD-10-CM | POA: Diagnosis not present

## 2017-03-17 DIAGNOSIS — R197 Diarrhea, unspecified: Secondary | ICD-10-CM | POA: Diagnosis not present

## 2017-03-17 DIAGNOSIS — I10 Essential (primary) hypertension: Secondary | ICD-10-CM | POA: Insufficient documentation

## 2017-03-17 DIAGNOSIS — Z7982 Long term (current) use of aspirin: Secondary | ICD-10-CM | POA: Diagnosis not present

## 2017-03-17 DIAGNOSIS — R1030 Lower abdominal pain, unspecified: Secondary | ICD-10-CM | POA: Diagnosis not present

## 2017-03-17 HISTORY — DX: Dysphagia, unspecified: R13.10

## 2017-03-17 LAB — CBC WITH DIFFERENTIAL/PLATELET
BASOS ABS: 0 10*3/uL (ref 0.0–0.1)
Basophils Relative: 0 %
EOS ABS: 0.1 10*3/uL (ref 0.0–0.7)
EOS PCT: 1 %
HCT: 39.6 % (ref 36.0–46.0)
Hemoglobin: 13.4 g/dL (ref 12.0–15.0)
Lymphocytes Relative: 21 %
Lymphs Abs: 1.3 10*3/uL (ref 0.7–4.0)
MCH: 29.5 pg (ref 26.0–34.0)
MCHC: 33.8 g/dL (ref 30.0–36.0)
MCV: 87 fL (ref 78.0–100.0)
Monocytes Absolute: 0.4 10*3/uL (ref 0.1–1.0)
Monocytes Relative: 7 %
Neutro Abs: 4.3 10*3/uL (ref 1.7–7.7)
Neutrophils Relative %: 71 %
PLATELETS: 266 10*3/uL (ref 150–400)
RBC: 4.55 MIL/uL (ref 3.87–5.11)
RDW: 13.1 % (ref 11.5–15.5)
WBC: 6 10*3/uL (ref 4.0–10.5)

## 2017-03-17 LAB — COMPREHENSIVE METABOLIC PANEL
ALT: 5 U/L — ABNORMAL LOW (ref 14–54)
ANION GAP: 7 (ref 5–15)
AST: 11 U/L — ABNORMAL LOW (ref 15–41)
Albumin: 3.9 g/dL (ref 3.5–5.0)
Alkaline Phosphatase: 89 U/L (ref 38–126)
BUN: 26 mg/dL — ABNORMAL HIGH (ref 6–20)
CALCIUM: 9.7 mg/dL (ref 8.9–10.3)
CO2: 30 mmol/L (ref 22–32)
Chloride: 98 mmol/L — ABNORMAL LOW (ref 101–111)
Creatinine, Ser: 0.86 mg/dL (ref 0.44–1.00)
GFR calc non Af Amer: >60 mL/min (ref >60)
Glucose, Bld: 96 mg/dL (ref 65–99)
Potassium: 3.5 mmol/L (ref 3.5–5.1)
SODIUM: 135 mmol/L (ref 135–145)
Total Bilirubin: 1 mg/dL (ref 0.3–1.2)
Total Protein: 7.2 g/dL (ref 6.5–8.1)

## 2017-03-17 LAB — URINALYSIS, ROUTINE W REFLEX MICROSCOPIC
BILIRUBIN URINE: NEGATIVE
Glucose, UA: 50 mg/dL — AB
KETONES UR: NEGATIVE mg/dL
LEUKOCYTES UA: NEGATIVE
NITRITE: NEGATIVE
PROTEIN: NEGATIVE mg/dL
SPECIFIC GRAVITY, URINE: 1.01 (ref 1.005–1.030)
pH: 6 (ref 5.0–8.0)

## 2017-03-17 LAB — TROPONIN I: Troponin I: <0.03 ng/mL (ref <0.03)

## 2017-03-17 LAB — LIPASE, BLOOD: LIPASE: 30 U/L (ref 11–51)

## 2017-03-17 MED ORDER — SODIUM CHLORIDE 0.9 % IV SOLN
INTRAVENOUS | Status: DC
  Administered 2017-03-17: 13:00:00 via INTRAVENOUS

## 2017-03-17 MED ORDER — ONDANSETRON HCL 4 MG/2ML IJ SOLN
4.0000 mg | INTRAMUSCULAR | Status: DC | PRN
  Administered 2017-03-17: 4 mg via INTRAVENOUS
  Filled 2017-03-17: qty 2

## 2017-03-17 MED ORDER — FAMOTIDINE IN NACL 20-0.9 MG/50ML-% IV SOLN
20.0000 mg | Freq: Once | INTRAVENOUS | Status: AC
  Administered 2017-03-17: 20 mg via INTRAVENOUS
  Filled 2017-03-17: qty 50

## 2017-03-17 MED ORDER — ONDANSETRON 4 MG PO TBDP: 4.0000 mg | ORAL_TABLET | Freq: Three times a day (TID) | ORAL | 0 refills | Status: DC | PRN

## 2017-03-17 MED ORDER — IOPAMIDOL (ISOVUE-300) INJECTION 61%
100.0000 mL | Freq: Once | INTRAVENOUS | Status: AC | PRN
  Administered 2017-03-17: 100 mL via INTRAVENOUS

## 2017-03-17 NOTE — Discharge Instructions (Signed)
Take the prescription as directed.  Increase your fluid intake (ie:  Gatoraide) for the next few days.  Eat a bland diet and advance to your regular diet slowly as you can tolerate it.  Call your regular medical doctor today to schedule a follow up appointment within the next 3 days. Return to the Emergency Department immediately sooner if worsening.

## 2017-03-17 NOTE — ED Notes (Signed)
Patient transported to X-ray 

## 2017-03-17 NOTE — ED Notes (Signed)
Pt given peanut butter, crackers and water.

## 2017-03-17 NOTE — ED Provider Notes (Signed)
[place] DEPT Provider Note   CSN: [id number] Arrival date & time: 03/17/17  1004     History   Chief Complaint Chief Complaint  Patient presents with  . Nausea    HPI Heather Gilmore is a 83 y.o. female.     Pt was seen at 1015. Per pt and her family, c/o gradual onset and persistence of poor PO intake and multiple intermittent episodes of N/V and "loose stools" for the past 6 to 8 months. Pt states she was started on a new Parkinson's medication 2 months ago that makes her feel "drunk," "dizzy," and "nauseated." Pt was evaluated by her PMD 4 days ago for these symptoms, dx medication side effects. Pt's family states pt is becoming "more weak" and "confused at times." Pt states her abd is "sore." Denies black or blood in stools or emesis, no CP/palpitations, no SOB/cough, no fevers, no back pain, no focal motor weakness, no syncope.   Past Medical History:  Diagnosis Date  . Arthritis   . Asthma   . Dysphagia   . Essential tremor   . 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 Hyperlipidemia   . Hypertension   . IBS (irritable bowel syndrome)   . Parkinson disease ([place])   . Rosacea   . Thyroid nodule     Patient Active Problem List   Diagnosis Date Noted  . Urge incontinence 12/19/2013  . 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 Parkinson's disease ([place]) 05/28/2013  . Essential hypertension 06/09/2009  . Asthma 06/09/2009  . GERD 06/09/2009  . SYNCOPE 06/09/2009  . Abnormal involuntary movement 06/09/2009    Past Surgical History:  Procedure Laterality Date  . APPENDECTOMY    . CATARACT EXTRACTION W/PHACO  05/03/2011   Procedure: CATARACT EXTRACTION PHACO AND INTRAOCULAR LENS PLACEMENT (IOC);  Surgeon: Elta Guadeloupe T. Gershon Crane;  Location: [facility];  Service: Ophthalmology;  Laterality: Right;  CDE: 7.52  . CATARACT EXTRACTION W/PHACO  05/17/2011   Procedure: CATARACT EXTRACTION PHACO AND INTRAOCULAR LENS PLACEMENT (IOC);  Surgeon: Elta Guadeloupe T. Gershon Crane;  Location: [facility];   Service: Ophthalmology;  Laterality: Left;  CDE 9.49  . CHOLECYSTECTOMY  6 yrs ago   aph-Dr Arnoldo Morale  . ESOPHAGEAL DILATION N/A 03/02/2016   Procedure: ESOPHAGEAL DILATION;  Surgeon: Rogene Houston, MD;  Location: [facility];  Service: Endoscopy;  Laterality: N/A;  . ESOPHAGOGASTRODUODENOSCOPY N/A 03/02/2016   Procedure: ESOPHAGOGASTRODUODENOSCOPY (EGD);  Surgeon: Rogene Houston, MD;  Location: [facility];  Service: Endoscopy;  Laterality: N/A;  1:25  . TONSILLECTOMY     as child    OB History    No data available       Home Medications    Prior to Admission medications   Medication Sig Start Date End Date Taking? Authorizing Provider  acetaminophen (TYLENOL) 500 MG tablet Take 500 mg by mouth every 6 (six) hours as needed. For pain     [provider]  albuterol (PROVENTIL HFA;VENTOLIN HFA) 108 (90 Base) MCG/ACT inhaler Please dispense two if possible. For shortness of breath 10/06/15   Mikey Kirschner, MD  aspirin EC 81 MG tablet Take 81 mg by mouth daily.    [provider]  carbidopa-levodopa (SINEMET IR) 25-100 MG tablet TAKE (1) TABLET BY MOUTH (4) TIMES DAILY. Patient taking differently: TAKE (1) TABLET BY MOUTH (4) TIMES DAILY. 09/13/16   Mikey Kirschner, MD  PRESCRIPTION MEDICATION Rasagaline 0.5 one daily. Dr.[place].    [provider]  Vitamin  D, Ergocalciferol, (DRISDOL) 50000 UNITS CAPS capsule Take 50,000 Units by mouth every 7 (seven) days.    [provider]    Family History Family History  Problem Relation Age of Onset  . Anesthesia problems Neg Hx   . Hypotension Neg Hx   . Malignant hyperthermia Neg Hx   . Pseudochol deficiency Neg Hx     Social History Social History  Substance Use Topics  . Smoking status: Former Smoker    Packs/day: 1.00    Years: 10.00    Types: Cigarettes    Quit date: 04/28/1991  . Smokeless tobacco: Never Used  . Alcohol use No     Allergies   Dyazide [hydrochlorothiazide  w-triamterene] and Penicillins   Review of Systems Review of Systems ROS: Statement: All systems negative except as marked or noted in the HPI; Constitutional: Negative for fever and chills. ; ; Eyes: Negative for eye pain, redness and discharge. ; ; ENMT: Negative for ear pain, hoarseness, nasal congestion, sinus pressure and sore throat. ; ; Cardiovascular: Negative for chest pain, palpitations, diaphoresis, dyspnea and peripheral edema. ; ; Respiratory: Negative for cough, wheezing and stridor. ; ; Gastrointestinal: +N/V/D, abd pain. Negative for blood in stool, hematemesis, jaundice and rectal bleeding. . ; ; Genitourinary: Negative for dysuria, flank pain and hematuria. ; ; Musculoskeletal: Negative for back pain and neck pain. Negative for swelling and trauma.; ; Skin: Negative for pruritus, rash, abrasions, blisters, bruising and skin lesion.; ; Neuro: +generalized weakness, confused at times. Negative for headache, lightheadedness and neck stiffness. Negative for altered level of consciousness, extremity weakness, paresthesias, involuntary movement, seizure and syncope.       Physical Exam Updated Vital Signs BP (!) 193/81 (BP Location: Right Arm)   Pulse 66   Temp 98.8 F (37.1 C) (Oral)   Resp 20   Ht 5' (1.524 m)   Wt 45.8 kg (101 lb)   SpO2 96%   BMI 19.73 kg/m     14:35 Orthostatic Vital Signs AC  Orthostatic Lying   BP- Lying: 164/67  Pulse- Lying: 72      Orthostatic Sitting  BP- Sitting: 165/61  Pulse- Sitting: 71      Orthostatic Standing at 0 minutes  BP- Standing at 0 minutes: 138/61  Pulse- Standing at 0 minutes: 72     Physical Exam 1020: Physical examination:  Nursing notes reviewed; Vital signs and O2 SAT reviewed;  Constitutional: Well developed, Well nourished, In no acute distress; Head:  Normocephalic, atraumatic; Eyes: EOMI, PERRL, No scleral icterus; ENMT: Mouth and pharynx normal, Mucous membranes dry; Neck: Supple, Full range of motion, No  lymphadenopathy; Cardiovascular: Regular rate and rhythm, No gallop; Respiratory: Breath sounds clear & equal bilaterally, No wheezes.  Speaking full sentences with ease, Normal respiratory effort/excursion; Chest: Nontender, Movement normal; Abdomen: Soft, +mild diffuse tenderness to palp. No rebound or guarding. Nondistended, Normal bowel sounds; Genitourinary: No CVA tenderness; Extremities: Pulses normal, No tenderness, No edema, No calf edema or asymmetry.; Neuro: AA&Ox3, Major CN grossly intact. No facial droop. Speech clear. No gross focal motor or sensory deficits in extremities.; Skin: Color normal, Warm, Dry.   ED Treatments / Results  Labs (all labs ordered are listed, but only abnormal results are displayed)   EKG  EKG Interpretation  Date/Time:  Friday March 17 2017 10:25:41 EDT Ventricular Rate:  64 PR Interval:    QRS Duration: 94 QT Interval:  472 QTC Calculation: 491 R Axis:   15 Text Interpretation:  Sinus rhythm  Borderline T wave abnormalities Borderline prolonged QT interval Baseline wander Poor data quality When compared with ECG of 04/28/2011 No significant change was found Confirmed by Francine Graven [phone]) on 03/17/2017 10:31:38 AM       Radiology   Procedures Procedures (including critical care time)  Medications Ordered in ED Medications  ondansetron (ZOFRAN) injection 4 mg (4 mg Intravenous Given 03/17/17 1036)  famotidine (PEPCID) IVPB 20 mg premix (20 mg Intravenous New Bag/Given 03/17/17 1036)     Initial Impression / Assessment and Plan / ED Course  I have reviewed the triage vital signs and the nursing notes.  Pertinent labs & imaging results that were available during my care of the patient were reviewed by me and considered in my medical decision making (see chart for details).  MDM Reviewed: previous chart, nursing note and vitals Reviewed previous: labs and ECG Interpretation: labs, ECG, x-ray and CT scan   Results for orders placed  or performed during the hospital encounter of 03/17/17  Urinalysis, Routine w reflex microscopic  Result Value Ref Range   Color, Urine YELLOW YELLOW   APPearance CLEAR CLEAR   Specific Gravity, Urine 1.010 1.005 - 1.030   pH 6.0 5.0 - 8.0   Glucose, UA 50 (A) NEGATIVE mg/dL   Hgb urine dipstick SMALL (A) NEGATIVE   Bilirubin Urine NEGATIVE NEGATIVE   Ketones, ur NEGATIVE NEGATIVE mg/dL   Protein, ur NEGATIVE NEGATIVE mg/dL   Nitrite NEGATIVE NEGATIVE   Leukocytes, UA NEGATIVE NEGATIVE   RBC / HPF 0-5 0 - 5 RBC/hpf   WBC, UA 0-5 0 - 5 WBC/hpf   Bacteria, UA RARE (A) NONE SEEN   Squamous Epithelial / LPF 0-5 (A) NONE SEEN   Mucous PRESENT   Comprehensive metabolic panel  Result Value Ref Range   Sodium 135 135 - 145 mmol/L   Potassium 3.5 3.5 - 5.1 mmol/L   Chloride 98 (L) 101 - 111 mmol/L   CO2 30 22 - 32 mmol/L   Glucose, Bld 96 65 - 99 mg/dL   BUN 26 (H) 6 - 20 mg/dL   Creatinine, Ser 0.86 0.44 - 1.00 mg/dL   Calcium 9.7 8.9 - 10.3 mg/dL   Total Protein 7.2 6.5 - 8.1 g/dL   Albumin 3.9 3.5 - 5.0 g/dL   AST 11 (L) 15 - 41 U/L   ALT 5 (L) 14 - 54 U/L   Alkaline Phosphatase 89 38 - 126 U/L   Total Bilirubin 1.0 0.3 - 1.2 mg/dL   GFR calc non Af Amer >60 >60 mL/min   GFR calc Af Amer >60 >60 mL/min   Anion gap 7 5 - 15  Troponin I  Result Value Ref Range   Troponin I <0.03 <0.03 ng/mL  Lipase, blood  Result Value Ref Range   Lipase 30 11 - 51 U/L  CBC with Differential  Result Value Ref Range   WBC 6.0 4.0 - 10.5 K/uL   RBC 4.55 3.87 - 5.11 MIL/uL   Hemoglobin 13.4 12.0 - 15.0 g/dL   HCT 39.6 36.0 - 46.0 %   MCV 87.0 78.0 - 100.0 fL   MCH 29.5 26.0 - 34.0 pg   MCHC 33.8 30.0 - 36.0 g/dL   RDW 13.1 11.5 - 15.5 %   Platelets 266 150 - 400 K/uL   Neutrophils Relative % 71 %   Neutro Abs 4.3 1.7 - 7.7 K/uL   Lymphocytes Relative 21 %   Lymphs Abs 1.3 0.7 - 4.0 K/uL  Monocytes Relative 7 %   Monocytes Absolute 0.4 0.1 - 1.0 K/uL   Eosinophils Relative 1 %    Eosinophils Absolute 0.1 0.0 - 0.7 K/uL   Basophils Relative 0 %   Basophils Absolute 0.0 0.0 - 0.1 K/uL   Dg Chest 2 View Result Date: 03/17/2017 CLINICAL DATA:  Nausea and vomiting for few weeks. EXAM: CHEST  2 VIEW COMPARISON:  Single-view of the chest 04/25/2011. FINDINGS: There is some coarsening of the pulmonary interstitium. Lungs are clear. Nipple shadows bilaterally are noted. No pneumothorax or pleural effusion. Heart size is upper normal. No acute bony abnormality. IMPRESSION: No acute disease. Electronically Signed   By: Inge Rise M.D.   On: 03/17/2017 11:09   Ct Abdomen Pelvis W Contrast Result Date: 03/17/2017 CLINICAL DATA:  Nausea, vomiting. EXAM: CT ABDOMEN AND PELVIS WITH CONTRAST TECHNIQUE: Multidetector CT imaging of the abdomen and pelvis was performed using the standard protocol following bolus administration of intravenous contrast. CONTRAST:  172mL ISOVUE-300 IOPAMIDOL (ISOVUE-300) INJECTION 61% COMPARISON:  CT scan of July 18, 2006. FINDINGS: Lower chest: No acute abnormality. Hepatobiliary: No focal liver abnormality is seen. Status post cholecystectomy. No biliary dilatation. Pancreas: Unremarkable. No pancreatic ductal dilatation or surrounding inflammatory changes. Spleen: Normal in size without focal abnormality. Adrenals/Urinary Tract: Adrenal glands are unremarkable. Kidneys are normal, without renal calculi, focal lesion, or hydronephrosis. Bladder is unremarkable. Stomach/Bowel: The stomach appears normal. There is no evidence of bowel obstruction. Status post appendectomy. Diverticulosis of descending and sigmoid colon is noted without acute inflammation. Vascular/Lymphatic: Aortic atherosclerosis. No enlarged abdominal or pelvic lymph nodes. Reproductive: Uterus and bilateral adnexa are unremarkable. Other: No abdominal wall hernia or abnormality. No abdominopelvic ascites. Musculoskeletal: No acute or significant osseous findings. IMPRESSION: Diverticulosis of  descending and sigmoid colon without inflammation. Aortic atherosclerosis. No acute abnormality seen in the abdomen or pelvis. Electronically Signed   By: Marijo Conception, M.D.   On: 03/17/2017 13:08    1520:   Workup reassuring. Pt not orthostatic on VS. Pt ambulated to bathroom with assist x1.  Has tol PO well without N/V. No clear indication for admission at this time. Family would like to take pt home. Dx and testing d/w pt and family.  Questions answered.  Verb understanding, agreeable to d/c home with outpt f/u.     Final Clinical Impressions(s) / ED Diagnoses   Final diagnoses:  None    New Prescriptions New Prescriptions   No medications on file      Francine Graven, DO 03/22/17 1517

## 2017-03-17 NOTE — ED Notes (Signed)
Ambulated to bathroom with 1 person assistance.  Pt has unsteady gait.

## 2017-03-17 NOTE — ED Notes (Signed)
No n/v after consuming peanut butter,crackers and water.

## 2017-03-17 NOTE — ED Triage Notes (Signed)
Patient complaining of nausea and vomiting x 6-8 months. States she was seen on Monday by PCP. Per family, patient is "becoming confused at times." Patient also complains of lower abdominal pain with palpation.

## 2017-03-19 LAB — URINE CULTURE

## 2017-03-20 DIAGNOSIS — I69922 Dysarthria following unspecified cerebrovascular disease: Secondary | ICD-10-CM | POA: Diagnosis not present

## 2017-03-20 DIAGNOSIS — G629 Polyneuropathy, unspecified: Secondary | ICD-10-CM | POA: Diagnosis not present

## 2017-03-20 DIAGNOSIS — I951 Orthostatic hypotension: Secondary | ICD-10-CM | POA: Diagnosis not present

## 2017-03-20 DIAGNOSIS — R1310 Dysphagia, unspecified: Secondary | ICD-10-CM | POA: Diagnosis not present

## 2017-03-20 DIAGNOSIS — R260 Ataxic gait: Secondary | ICD-10-CM | POA: Diagnosis not present

## 2017-03-20 DIAGNOSIS — I10 Essential (primary) hypertension: Secondary | ICD-10-CM | POA: Diagnosis not present

## 2017-03-20 DIAGNOSIS — M542 Cervicalgia: Secondary | ICD-10-CM | POA: Diagnosis not present

## 2017-03-20 DIAGNOSIS — G20 Parkinson's disease: Secondary | ICD-10-CM | POA: Diagnosis not present

## 2017-03-20 DIAGNOSIS — G4714 Hypersomnia due to medical condition: Secondary | ICD-10-CM | POA: Diagnosis not present

## 2017-03-20 DIAGNOSIS — Z79899 Other long term (current) drug therapy: Secondary | ICD-10-CM | POA: Diagnosis not present

## 2017-03-20 DIAGNOSIS — J45909 Unspecified asthma, uncomplicated: Secondary | ICD-10-CM | POA: Diagnosis not present

## 2017-03-20 DIAGNOSIS — F482 Pseudobulbar affect: Secondary | ICD-10-CM | POA: Diagnosis not present

## 2017-04-19 DIAGNOSIS — I951 Orthostatic hypotension: Secondary | ICD-10-CM | POA: Diagnosis not present

## 2017-04-19 DIAGNOSIS — I69922 Dysarthria following unspecified cerebrovascular disease: Secondary | ICD-10-CM | POA: Diagnosis not present

## 2017-04-19 DIAGNOSIS — R1310 Dysphagia, unspecified: Secondary | ICD-10-CM | POA: Diagnosis not present

## 2017-04-19 DIAGNOSIS — G4714 Hypersomnia due to medical condition: Secondary | ICD-10-CM | POA: Diagnosis not present

## 2017-04-19 DIAGNOSIS — G20 Parkinson's disease: Secondary | ICD-10-CM | POA: Diagnosis not present

## 2017-04-19 DIAGNOSIS — Z79899 Other long term (current) drug therapy: Secondary | ICD-10-CM | POA: Diagnosis not present

## 2017-04-19 DIAGNOSIS — R260 Ataxic gait: Secondary | ICD-10-CM | POA: Diagnosis not present

## 2017-04-19 DIAGNOSIS — I10 Essential (primary) hypertension: Secondary | ICD-10-CM | POA: Diagnosis not present

## 2017-04-19 DIAGNOSIS — G629 Polyneuropathy, unspecified: Secondary | ICD-10-CM | POA: Diagnosis not present

## 2017-04-19 DIAGNOSIS — J45909 Unspecified asthma, uncomplicated: Secondary | ICD-10-CM | POA: Diagnosis not present

## 2017-04-19 DIAGNOSIS — M542 Cervicalgia: Secondary | ICD-10-CM | POA: Diagnosis not present

## 2017-08-17 ENCOUNTER — Ambulatory Visit (INDEPENDENT_AMBULATORY_CARE_PROVIDER_SITE_OTHER): Payer: PPO | Admitting: Family Medicine

## 2017-08-17 ENCOUNTER — Other Ambulatory Visit (HOSPITAL_COMMUNITY)
Admission: RE | Admit: 2017-08-17 | Discharge: 2017-08-17 | Disposition: A | Discharge status: Home / Self Care | Payer: PPO | Source: Ambulatory Visit | Attending: Family Medicine | Admitting: Family Medicine

## 2017-08-17 ENCOUNTER — Ambulatory Visit (HOSPITAL_COMMUNITY)
Admission: RE | Admit: 2017-08-17 | Discharge: 2017-08-17 | Disposition: A | Discharge status: Home / Self Care | Payer: PPO | Source: Ambulatory Visit | Attending: Family Medicine | Admitting: Family Medicine

## 2017-08-17 ENCOUNTER — Encounter: Payer: Self-pay | Admitting: Family Medicine

## 2017-08-17 VITALS — BP 110/68 | Ht 60.0 in | Wt 121.6 lb

## 2017-08-17 DIAGNOSIS — G20 Parkinson's disease: Secondary | ICD-10-CM | POA: Diagnosis not present

## 2017-08-17 DIAGNOSIS — R5383 Other fatigue: Secondary | ICD-10-CM | POA: Diagnosis not present

## 2017-08-17 DIAGNOSIS — M7989 Other specified soft tissue disorders: Secondary | ICD-10-CM | POA: Insufficient documentation

## 2017-08-17 DIAGNOSIS — I10 Essential (primary) hypertension: Secondary | ICD-10-CM

## 2017-08-17 DIAGNOSIS — R600 Localized edema: Secondary | ICD-10-CM | POA: Diagnosis not present

## 2017-08-17 LAB — HEPATIC FUNCTION PANEL
ALBUMIN: 3.7 g/dL (ref 3.5–5.0)
ALK PHOS: 96 U/L (ref 38–126)
ALT: 5 U/L — AB (ref 14–54)
AST: 14 U/L — ABNORMAL LOW (ref 15–41)
Bilirubin, Direct: 0.1 mg/dL (ref 0.1–0.5)
Indirect Bilirubin: 0.3 mg/dL (ref 0.3–0.9)
Total Bilirubin: 0.4 mg/dL (ref 0.3–1.2)
Total Protein: 7.6 g/dL (ref 6.5–8.1)

## 2017-08-17 LAB — BASIC METABOLIC PANEL
Anion gap: 10 (ref 5–15)
BUN: 23 mg/dL — AB (ref 6–20)
CHLORIDE: 102 mmol/L (ref 101–111)
CO2: 27 mmol/L (ref 22–32)
Calcium: 9.6 mg/dL (ref 8.9–10.3)
Creatinine, Ser: 0.77 mg/dL (ref 0.44–1.00)
GFR calc Af Amer: >60 mL/min (ref >60)
GFR calc non Af Amer: >60 mL/min (ref >60)
GLUCOSE: 85 mg/dL (ref 65–99)
POTASSIUM: 3.9 mmol/L (ref 3.5–5.1)
Sodium: 139 mmol/L (ref 135–145)

## 2017-08-17 LAB — BRAIN NATRIURETIC PEPTIDE: B Natriuretic Peptide: 82 pg/mL (ref 0.0–100.0)

## 2017-08-17 LAB — D-DIMER, QUANTITATIVE: D-Dimer, Quant: 1.19 ug/mL-FEU — ABNORMAL HIGH (ref 0.00–0.50)

## 2017-08-17 LAB — TSH: TSH: 1.789 u[IU]/mL (ref 0.350–4.500)

## 2017-08-17 MED ORDER — CARBIDOPA-LEVODOPA 25-100 MG PO TABS: ORAL_TABLET | ORAL | 0 refills | Status: DC

## 2017-08-17 NOTE — Progress Notes (Signed)
   Subjective:    Patient ID: Heather Gilmore, female    DOB: 1930/03/13, 84 y.o.   MRN: [id number]  HPI  Patient arrives wit c/o swelling in legs and feet since neurologist stopped some of her meds a few months ago.     Review of Systems No headache, no major weight loss or weight gain, no chest pain no back pain abdominal pain no change in bowel habits complete ROS otherwise negative     Objective:   Physical Exam  Alert and oriented, vitals reviewed and stable, NAD ENT-TM's and ext canals WNL bilat via otoscopic exam Soft palate, tonsils and post pharynx WNL via oropharyngeal exam Neck-symmetric, no masses; thyroid nonpalpable and nontender Pulmonary-no tachypnea or accessory muscle use; Clear without wheezes via auscultation Card--no abnrml murmurs, rhythm reg and rate WNL Carotid pulses symmetric, without bruits Chronic neurological sequela of Parkinson's persists.  Along with characteristic tremor.  Characteristic gait.  Characteristicfacies  Ankles 1+ edema bilateral pitting.     Assessment & Plan:  Impression 1 worsening leg edema.  Etiology unclear.  Blood work and ultrasound appropriate.  Addendum blood work stable.  Ultrasound negative for DVT beats despite past history.  We will add low-dose diuretic  2.  Parkinson's.  Ongoing challenges.  Patient very frustrated.  Request to change neurologist we will help expedite this  Greater than 50% of this 25 minute face to face visit was spent in counseling and discussion and coordination of care regarding the above diagnosis/diagnosies

## 2017-08-18 ENCOUNTER — Other Ambulatory Visit: Payer: Self-pay | Admitting: *Deleted

## 2017-08-21 ENCOUNTER — Other Ambulatory Visit: Payer: Self-pay | Admitting: *Deleted

## 2017-08-21 MED ORDER — FUROSEMIDE 20 MG PO TABS: ORAL_TABLET | ORAL | 3 refills | Status: DC

## 2017-08-23 ENCOUNTER — Encounter: Payer: Self-pay | Admitting: Family Medicine

## 2017-09-13 ENCOUNTER — Ambulatory Visit: Payer: PPO | Admitting: Family Medicine

## 2017-10-17 ENCOUNTER — Other Ambulatory Visit: Payer: Self-pay

## 2017-10-17 ENCOUNTER — Encounter: Payer: Self-pay | Admitting: Neurology

## 2017-10-17 ENCOUNTER — Encounter (INDEPENDENT_AMBULATORY_CARE_PROVIDER_SITE_OTHER): Payer: Self-pay

## 2017-10-17 ENCOUNTER — Ambulatory Visit (INDEPENDENT_AMBULATORY_CARE_PROVIDER_SITE_OTHER): Payer: PPO | Admitting: Neurology

## 2017-10-17 VITALS — BP 186/73 | HR 61 | Ht 60.0 in | Wt 121.5 lb

## 2017-10-17 DIAGNOSIS — R413 Other amnesia: Secondary | ICD-10-CM

## 2017-10-17 DIAGNOSIS — G20 Parkinson's disease: Secondary | ICD-10-CM

## 2017-10-17 DIAGNOSIS — R269 Unspecified abnormalities of gait and mobility: Secondary | ICD-10-CM

## 2017-10-17 HISTORY — DX: Unspecified abnormalities of gait and mobility: R26.9

## 2017-10-17 HISTORY — DX: Other amnesia: R41.3

## 2017-10-17 MED ORDER — CARBIDOPA-LEVODOPA ER 25-100 MG PO TBCR: 1.0000 | EXTENDED_RELEASE_TABLET | Freq: Four times a day (QID) | ORAL | 4 refills | Status: DC

## 2017-10-17 NOTE — Patient Instructions (Signed)
   We will change to Sinemet CR 25/100 taking one tablet 4 times a day.  Call in 3 weeks if the morning time is still problematic, we will add back a regular sinemet dose.

## 2017-10-17 NOTE — Progress Notes (Signed)
Reason for visit: Parkinson's disease  Referring physician: Dr. [name] is a 84 y.o. female  History of present illness:  Ms. Heather Gilmore is an 84 year old right-handed white female with a history of Parkinson's disease diagnosed about 10 years ago, the patient originally was followed through this practice by Dr. Doy Mince.  The patient has more recent been followed by Dr. Lily Lovings.  She currently is on Sinemet 25/100 mg tablets taking 1 tablet 4 times daily.  About 6 months ago or so she was on double that dose, she was having a lot of problems with weight loss, drowsiness, and cognitive changes.  The patient has also been on selegiline and Azilect which resulted in decreased appetite and drowsiness.  She is off of these medications currently.  The patient has cut back to 25/100 mg Sinemet IR taking 1 tablet 4 times daily, she is tolerating this better.  She is having some problems with falls, she occasionally have collapse of the legs, she has a history of drops in blood pressure at times but usually she is hypertensive.  The patient is having some troubles with constipation alternating with diarrhea.  She will have urinary urgency and incontinence.  She drools a lot, she also reports some troubles with memory.  She sleeps fairly well at night, she goes to bed late around 1 AM and gets up around 10 AM.  She will talk in her sleep, occasionally she will have a sense that someone is in the room but when she looks no one is there.  She does not have overt hallucinations.  She has undergone some physical therapy in the summer 2018 for ambulation.  She is sent to this office for further evaluation.  Past Medical History:  Diagnosis Date  . Arthritis   . Asthma   . Dysphagia   . Essential tremor   . 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 Hyperlipidemia   . Hypertension   . IBS (irritable bowel syndrome)   . Parkinson disease ([place])   . Rosacea   . Thyroid nodule     Past  Surgical History:  Procedure Laterality Date  . APPENDECTOMY    . CATARACT EXTRACTION W/PHACO  05/03/2011   Procedure: CATARACT EXTRACTION PHACO AND INTRAOCULAR LENS PLACEMENT (IOC);  Surgeon: Elta Guadeloupe T. Gershon Crane;  Location: [facility];  Service: Ophthalmology;  Laterality: Right;  CDE: 7.52  . CATARACT EXTRACTION W/PHACO  05/17/2011   Procedure: CATARACT EXTRACTION PHACO AND INTRAOCULAR LENS PLACEMENT (IOC);  Surgeon: Elta Guadeloupe T. Gershon Crane;  Location: [facility];  Service: Ophthalmology;  Laterality: Left;  CDE 9.49  . CHOLECYSTECTOMY  6 yrs ago   aph-Dr Arnoldo Morale  . ESOPHAGEAL DILATION N/A 03/02/2016   Procedure: ESOPHAGEAL DILATION;  Surgeon: Rogene Houston, MD;  [facility];  Service: Endoscopy;  Laterality: N/A;  . ESOPHAGOGASTRODUODENOSCOPY N/A 03/02/2016   Procedure: ESOPHAGOGASTRODUODENOSCOPY (EGD);  Surgeon: Rogene Houston, MD;  [facility];  Service: Endoscopy;  Laterality: N/A;  1:25  . TONSILLECTOMY     as child    Family History  Problem Relation Age of Onset  . Anesthesia problems Neg Hx   . Hypotension Neg Hx   . Malignant hyperthermia Neg Hx   . Pseudochol deficiency Neg Hx     Social history:  reports that she quit smoking about 26 years ago. Her smoking use included cigarettes. She has a 10.00 pack-year smoking history. she has never used smokeless tobacco. She  reports that she does not drink alcohol or use drugs.  Medications:  Prior to Admission medications   Medication Sig Start Date End Date Taking? Authorizing Provider  acetaminophen (TYLENOL) 500 MG tablet Take 500 mg by mouth every 6 (six) hours as needed. For pain    Yes [provider]  albuterol (PROVENTIL HFA;VENTOLIN HFA) 108 (90 Base) MCG/ACT inhaler Please dispense two if possible. For shortness of breath 10/06/15  Yes Mikey Kirschner, MD  carbidopa-levodopa (SINEMET IR) 25-100 MG tablet TAKE (1) TABLET BY MOUTH (4) TIMES DAILY. 08/17/17  Yes Mikey Kirschner, MD  furosemide (LASIX) 20 MG  tablet One tablet up to 3 times a week for swelling 08/21/17  Yes Mikey Kirschner, MD  ondansetron (ZOFRAN ODT) 4 MG disintegrating tablet Take 1 tablet (4 mg total) by mouth every 8 (eight) hours as needed for nausea or vomiting. 03/17/17  Yes Francine Graven, DO  rasagiline (AZILECT) 0.5 MG TABS tablet  03/02/17  Yes [provider]  Vitamin D, Ergocalciferol, (DRISDOL) 50000 UNITS CAPS capsule Take 50,000 Units by mouth every 7 (seven) days.   Yes [provider]      Allergies  Allergen Reactions  . Dyazide [Hydrochlorothiazide W-Triamterene]   . Penicillins Other (See Comments)    Reaction: head felt on fire.    ROS:  Out of a complete 14 system review of symptoms, the patient complains only of the following symptoms, and all other reviewed systems are negative.  Swelling in the legs Hearing loss, dizziness, difficulty swallowing Snoring Incontinence of the bladder Easy bruising Memory loss, confusion, weakness, difficulty swallowing, dizziness, passing out, tremor   Blood pressure (!) 186/73, pulse 61, height 5' (1.524 m), weight 121 lb 8 oz (55.1 kg).   Blood pressure, right arm, sitting is 186/70.  Blood pressure, right arm, standing is 168/70.  Physical Exam  General: The patient is alert and cooperative at the time of the examination.  Eyes: Pupils are equal, round, and reactive to light. Discs are flat bilaterally.  Neck: The neck is supple, no carotid bruits are noted.  Respiratory: The respiratory examination is notable for occasional wheezes anteriorly and posteriorly, right greater than left.  Cardiovascular: The cardiovascular examination reveals a regular rate and rhythm, no obvious murmurs or rubs are noted.  Skin: Extremities are with 2+ edema below the knees bilaterally.  Neurologic Exam  Mental status: The patient is alert and oriented x 3 at the time of the examination. The patient has apparent normal recent and remote memory,  with an apparently normal attention span and concentration ability.  Cranial nerves: Facial symmetry is present. There is good sensation of the face to pinprick and soft touch bilaterally. The strength of the facial muscles and the muscles to head turning and shoulder shrug are normal bilaterally. Speech is well enunciated, no aphasia or dysarthria is noted. Extraocular movements are full. Visual fields are full. The tongue is midline, and the patient has symmetric elevation of the soft palate. No obvious hearing deficits are noted.  Masking of the face is seen.  Motor: The motor testing reveals 5 over 5 strength of all 4 extremities. Good symmetric motor tone is noted throughout.  Sensory: Sensory testing is intact to pinprick, soft touch, vibration sensation, and position sense on all 4 extremities, with exception some decrease in position sense on the left foot. No evidence of extinction is noted.  Coordination: Cerebellar testing reveals good finger-nose-finger and heel-to-shin bilaterally.  Dyskinesias involving the head and  neck and shoulders are noted.  A resting tremors noted with the left upper extremity.  Gait and station: Gait is slightly unsteady, the patient can walk independently, she has good arm swing on the right, decreased on the left with tremor noted in the left arm with walking.  Tandem gait was not attempted.  Romberg is negative.  Reflexes: Deep tendon reflexes are symmetric and normal bilaterally. Toes are downgoing bilaterally.   CT head 05/06/16:  IMPRESSION: 1.  No acute intracranial abnormality. 2. Progressed nonspecific cerebral white matter changes since 2013, most commonly due to chronic small vessel disease. Extent is mild to moderate for age. 3. No ventriculomegaly. No prior cortically based infarct identified.  * CT scan images were reviewed online. I agree with the written report.    Assessment/Plan:  1.  Parkinson's disease  2.  Gait  disturbance  3.  Memory disturbance  The patient will be switched to Sinemet CR, she is having some problems with wearing off of her medication prior to the next dose.  She will take the 25/100 mg CR tablet 4 times daily.  She will call in 3 weeks if she is having problems, we may add a Sinemet IR tablet 25/100, 1/2 tablet in the morning.  The patient does not wish to consider medication for memory at this time, the memory will need to be followed over time.  She does drool, we discussed the possibility of Botox injections for this. She wishes to think about this potential therapy. The patient will follow-up in 4 months.  Jill Alexanders MD 10/17/2017 11:27 AM  [facility] [address] [place], [place] [address]  Phone [phone] Fax [phone]

## 2017-10-31 ENCOUNTER — Encounter (INDEPENDENT_AMBULATORY_CARE_PROVIDER_SITE_OTHER): Payer: Self-pay | Admitting: Internal Medicine

## 2017-10-31 ENCOUNTER — Ambulatory Visit (INDEPENDENT_AMBULATORY_CARE_PROVIDER_SITE_OTHER): Payer: PPO | Admitting: Internal Medicine

## 2017-10-31 VITALS — BP 144/80 | HR 80 | Temp 98.2°F | Ht 60.0 in | Wt 122.4 lb

## 2017-10-31 DIAGNOSIS — R1310 Dysphagia, unspecified: Secondary | ICD-10-CM | POA: Diagnosis not present

## 2017-10-31 DIAGNOSIS — R1319 Other dysphagia: Secondary | ICD-10-CM

## 2017-10-31 NOTE — Patient Instructions (Signed)
OV in 1 year. If any problems with swallowing, call our office sooner.

## 2017-10-31 NOTE — Progress Notes (Signed)
Subjective:    Patient ID: Heather Gilmore, female    DOB: May 01, 1930, 84 y.o.   MRN: [id number]  HPI Here today for f/u. Last seen in March of 2018. Hxo f dysphagia. Underwent an EGD/ED in 2017 which revealed benign appearing esophageal stenosis. Dilated. She tells she is doing pretty good. Her appetite is good. She has gained about 6 pounds since her visit in March of last year. She denies any abdominal pain. She has a BM daily. She denies any dysphagia.  Sometimes she has some constipation. No melena or BRRB. She is try to exercise, but she is afraid she will fall due to her Parkinson.  Hx of Parkinson's disease x 10 yrs and is followed by Dr. Margette Fast in  [place]. (Her mother had Parkinson's also).   July 2017 EGD/ED Impression: - Granular, longitudinally marked, white specked  mucosa in the esophagus. Biopsied to rule out  eosinophilic esophagitis. - Benign-appearing esophageal stenosis. Dilated. - 4 cm hiatal hernia. - Normal stomach. - Normal duodenal bulb and second portion of the  duodenum. Esophageal biopsy is negative for EoE.     Review of Systems Past Medical History:  Diagnosis Date  . Arthritis   . Asthma   . Dysphagia   . Essential tremor   . Gait abnormality 10/17/2017  . 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 Hyperlipidemia   . Hypertension   . IBS (irritable bowel syndrome)   . Memory difficulty 10/17/2017  . Parkinson disease ([place])   . Rosacea   . Thyroid nodule     Past Surgical History:  Procedure Laterality Date  . APPENDECTOMY    . CATARACT EXTRACTION W/PHACO  05/03/2011   Procedure: CATARACT EXTRACTION PHACO AND INTRAOCULAR LENS PLACEMENT (IOC);  Surgeon: Elta Guadeloupe T. Gershon Crane;  Location: [facility];  Service: Ophthalmology;  Laterality: Right;   CDE: 7.52  . CATARACT EXTRACTION W/PHACO  05/17/2011   Procedure: CATARACT EXTRACTION PHACO AND INTRAOCULAR LENS PLACEMENT (IOC);  Surgeon: Elta Guadeloupe T. Gershon Crane;  Location: [facility];  Service: Ophthalmology;  Laterality: Left;  CDE 9.49  . CHOLECYSTECTOMY  6 yrs ago   aph-Dr Arnoldo Morale  . ESOPHAGEAL DILATION N/A 03/02/2016   Procedure: ESOPHAGEAL DILATION;  Surgeon: Rogene Houston, MD;  [facility];  Service: Endoscopy;  Laterality: N/A;  . ESOPHAGOGASTRODUODENOSCOPY N/A 03/02/2016   Procedure: ESOPHAGOGASTRODUODENOSCOPY (EGD);  Surgeon: Rogene Houston, MD;  [facility];  Service: Endoscopy;  Laterality: N/A;  1:25  . TONSILLECTOMY     as child    Allergies  Allergen Reactions  . Dyazide [Hydrochlorothiazide W-Triamterene]   . Penicillins Other (See Comments)    Reaction: head felt on fire.    Current Outpatient Medications on File Prior to Visit  Medication Sig Dispense Refill  . acetaminophen (TYLENOL) 500 MG tablet Take 500 mg by mouth every 6 (six) hours as needed. For pain     . albuterol (PROVENTIL HFA;VENTOLIN HFA) 108 (90 Base) MCG/ACT inhaler Please dispense two if possible. For shortness of breath 6.7 g 2  . Carbidopa-Levodopa ER (SINEMET CR) 25-100 MG tablet controlled release Take 1 tablet by mouth 4 (four) times daily. 120 tablet 4  . furosemide (LASIX) 20 MG tablet One tablet up to 3 times a week for swelling 12 tablet 3  . Vitamin D, Ergocalciferol, (DRISDOL) 50000 UNITS CAPS capsule Take 50,000 Units by mouth every 7 (seven) days.     No current facility-administered medications on file prior to  visit.         Objective:   Physical Exam Blood pressure (!) 144/80, pulse 80, temperature 98.2 F (36.8 C), height 5' (1.524 m), weight 122 lb 6.4 oz (55.5 kg). Alert and oriented. Skin warm and dry. Oral mucosa is moist.   . Sclera anicteric, conjunctivae is pink. Thyroid not enlarged. No cervical lymphadenopathy. Lungs clear. Heart regular rate and rhythm.   Abdomen is soft. Bowel sounds are positive. No hepatomegaly. No abdominal masses felt. No tenderness.  No edema to lower extremities. Tremors noted to both hand and jaw.        Assessment & Plan:  Dysphagia. She seems to be doing well. Will see back in 1 year. If any problems, call our office sooner.

## 2017-12-01 IMAGING — US US CAROTID DUPLEX BILAT
1 series · 13 of 24 positions shown · non-contrast
Comparison: 11/21/2011.

CLINICAL DATA: Dysarthria.  Dysphagia.  Abnormal gait.

EXAM:
BILATERAL CAROTID DUPLEX ULTRASOUND
TECHNIQUE: Gray scale imaging, color Doppler and duplex ultrasound were
performed of bilateral carotid and vertebral arteries in the neck.

[Series 1: us carotid duplex bilat · 0.06mm/px · 13 of 68 slices shown]
[im 1/68]
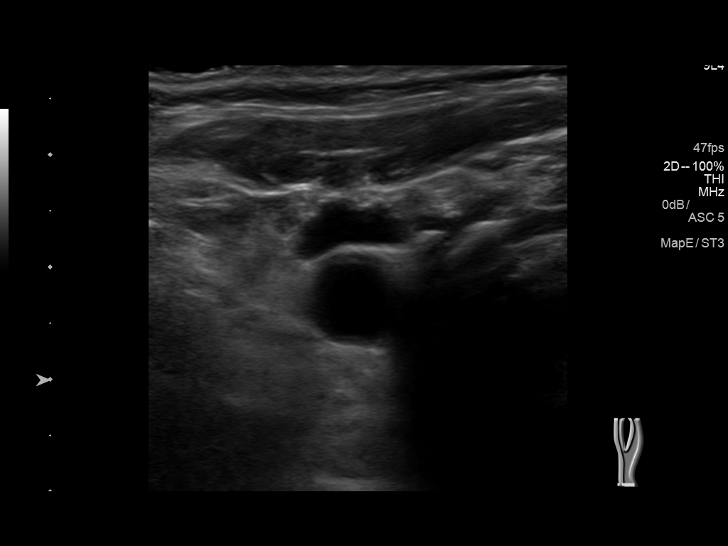
[im 6/68]
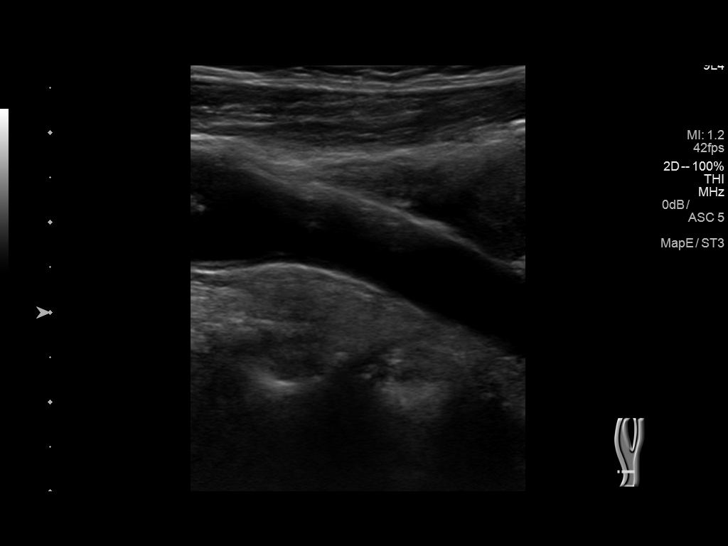
[im 12/68]
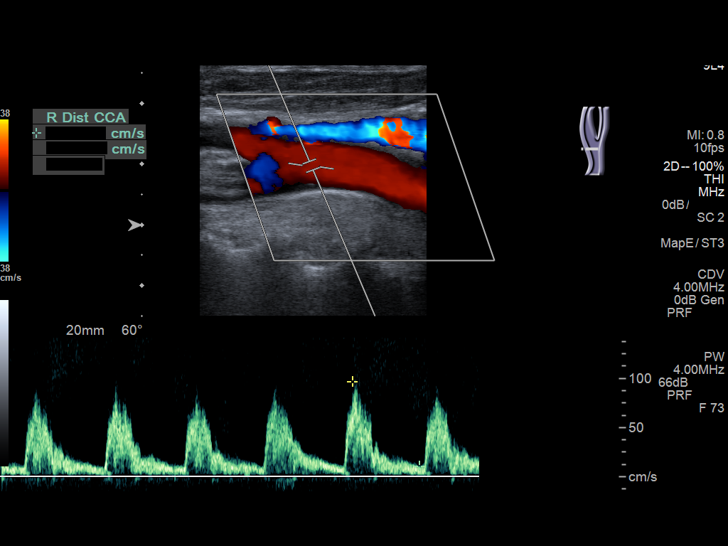
[im 18/68]
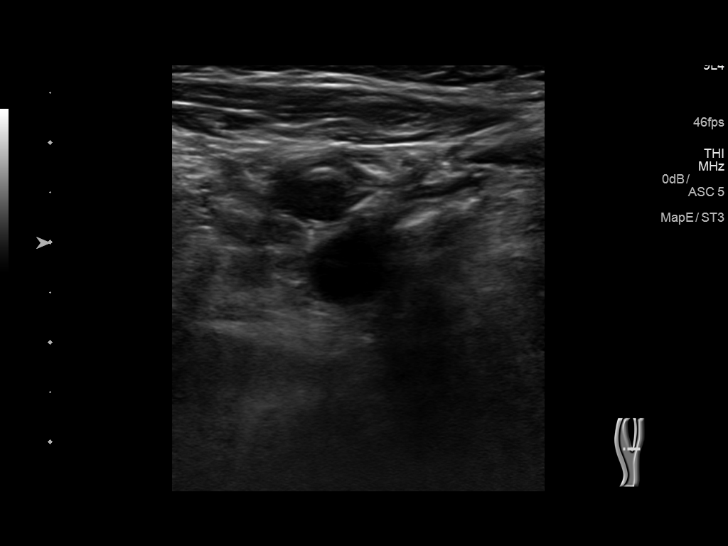
[im 24/68]
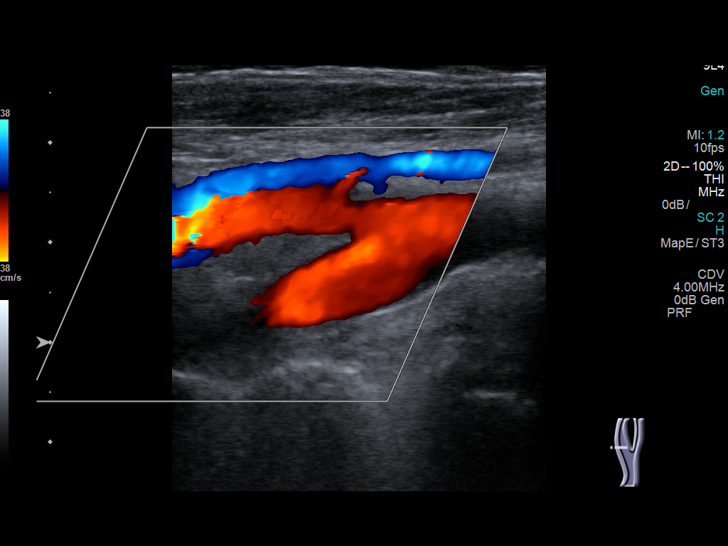
[im 30/68]
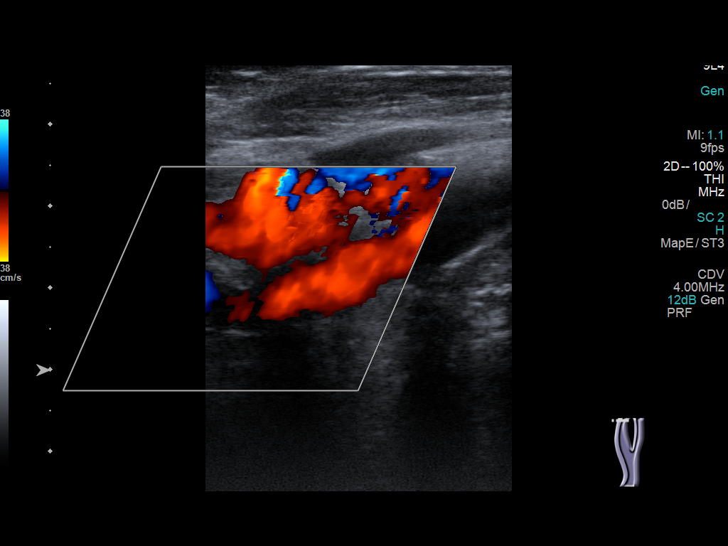
[im 35/68]
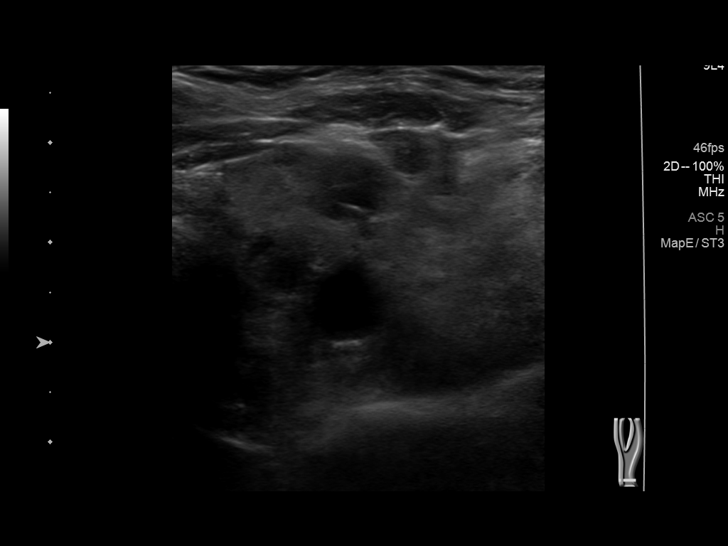
[im 38/68]
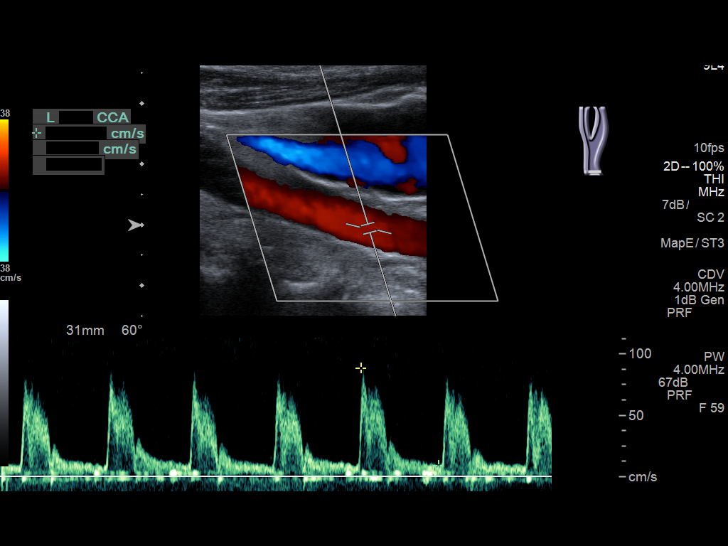
[im 44/68]
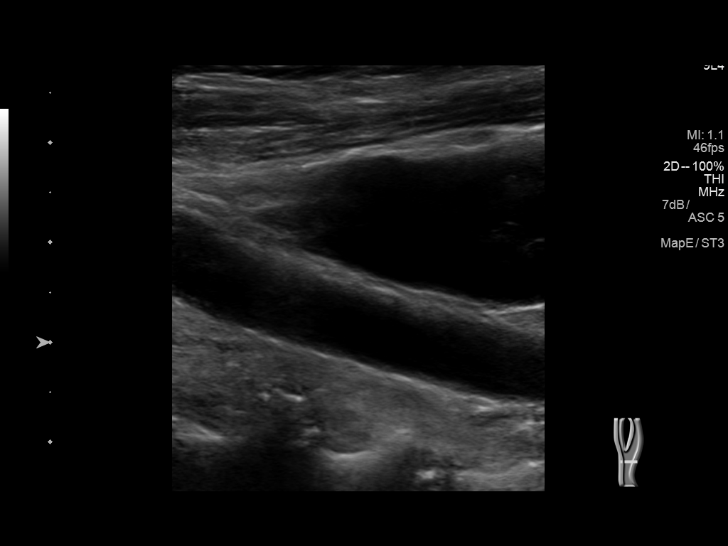
[im 50/68]
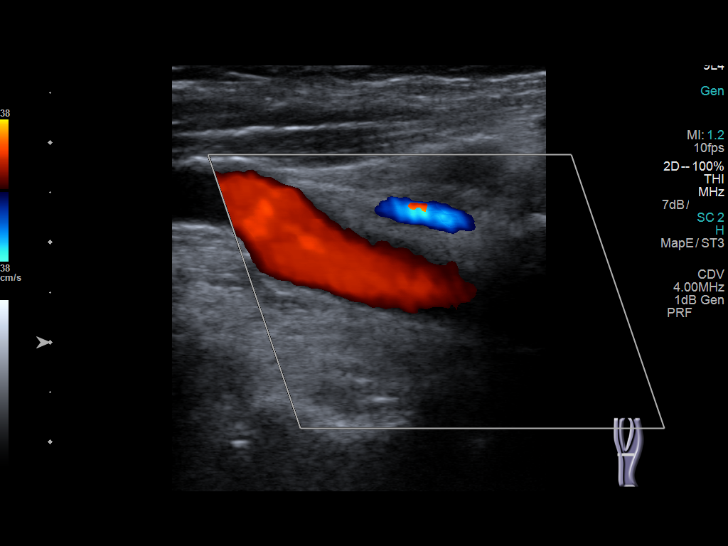
[im 56/68]
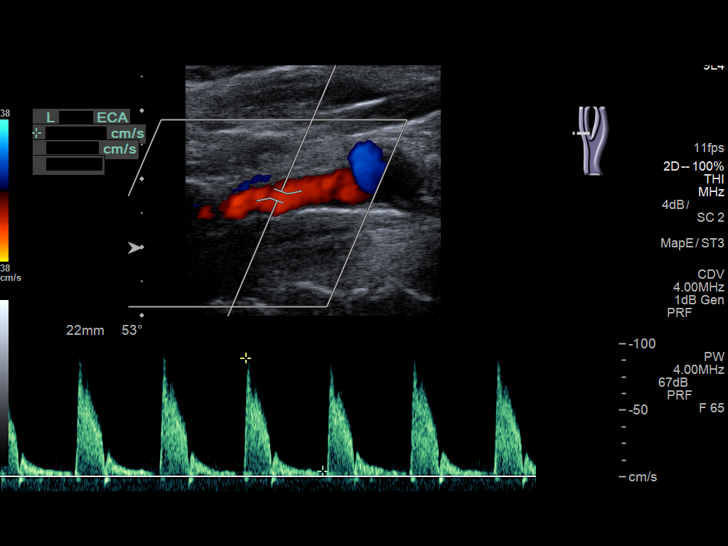
[im 62/68]
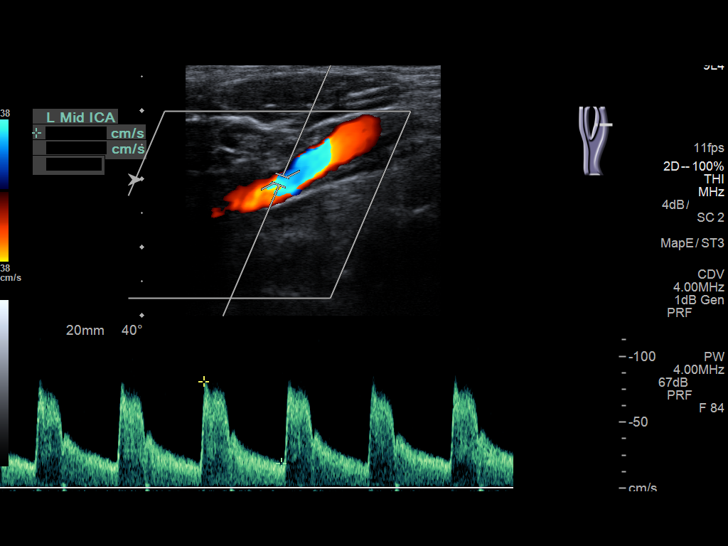
[im 68/68]
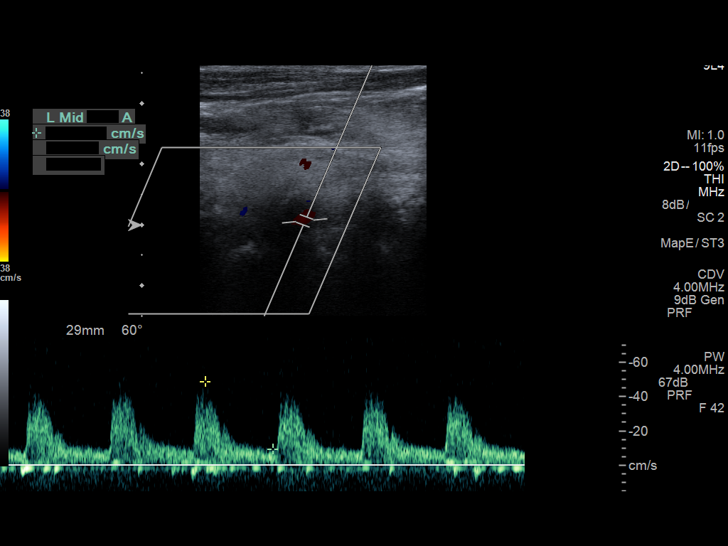

[13 of 24 positions shown; findings below may reference images not displayed]

FINDINGS: Criteria: Quantification of carotid stenosis is based on velocity
parameters that correlate the residual internal carotid diameter
with NASCET-based stenosis levels, using the diameter of the distal
internal carotid lumen as the denominator for stenosis measurement.

The following velocity measurements were obtained:

RIGHT

ICA:  71/10 cm/sec

CCA:  79/12 cm/sec

SYSTOLIC ICA/CCA RATIO:

DIASTOLIC ICA/CCA RATIO:

ECA:  100 cm/sec

LEFT

ICA:  81/19 cm/sec

CCA:  90/14 cm/sec

SYSTOLIC ICA/CCA RATIO:

DIASTOLIC ICA/CCA RATIO:

ECA:  89 cm/sec

RIGHT CAROTID ARTERY: No significant carotid atherosclerotic
vascular disease.

RIGHT VERTEBRAL ARTERY:  Patent with antegrade flow.

LEFT CAROTID ARTERY: No significant carotid atherosclerotic vascular
disease.

LEFT VERTEBRAL ARTERY:  Patent with antegrade flow.
IMPRESSION: 1. No significant carotid atherosclerotic vascular disease. The
carotids are widely patent bilaterally.

2. Vertebral arteries are patent with antegrade flow.

## 2018-02-14 ENCOUNTER — Ambulatory Visit (INDEPENDENT_AMBULATORY_CARE_PROVIDER_SITE_OTHER): Payer: PPO | Admitting: Family Medicine

## 2018-02-14 ENCOUNTER — Encounter: Payer: Self-pay | Admitting: Family Medicine

## 2018-02-14 VITALS — BP 132/92 | Ht 60.0 in | Wt 121.2 lb

## 2018-02-14 DIAGNOSIS — G20 Parkinson's disease: Secondary | ICD-10-CM

## 2018-02-14 DIAGNOSIS — K219 Gastro-esophageal reflux disease without esophagitis: Secondary | ICD-10-CM

## 2018-02-14 DIAGNOSIS — R413 Other amnesia: Secondary | ICD-10-CM

## 2018-02-14 DIAGNOSIS — J4520 Mild intermittent asthma, uncomplicated: Secondary | ICD-10-CM

## 2018-02-14 MED ORDER — FUROSEMIDE 20 MG PO TABS: ORAL_TABLET | ORAL | 5 refills | Status: DC

## 2018-02-14 MED ORDER — ALBUTEROL SULFATE HFA 108 (90 BASE) MCG/ACT IN AERS: INHALATION_SPRAY | RESPIRATORY_TRACT | 5 refills | Status: DC

## 2018-02-14 NOTE — Progress Notes (Signed)
   Subjective:    Patient ID: Heather Gilmore, female    DOB: 08-12-1929, 84 y.o.   MRN: [id number]  Hypertension  This is a chronic problem. The current episode started more than 1 year ago. Associated symptoms include headaches. There are no compliance problems.       Patient has hypertension.  Currently treated with no daily medicine.  Uses diuretics 3 times per week primarily for ankle swelling.  Not interested in medicine where she has to take   New neurology visit wen treal well, have adjusted medicine, overall doing okay with those  Patient notes ongoing frustration and periods of feeling down regarding her chronic condition.  At times feels sad.  Not interested in medication for this at this time.  No suicidal or homicidal thoughts.  Patient reports overall her reflux is stable substantial baseline tremor noted.   Review of Systems  Neurological: Positive for headaches.       Objective:   Physical Exam   Alert and oriented, vitals reviewed and stable, NAD ENT-TM's and ext canals WNL bilat via otoscopic exam Soft palate, tonsils and post pharynx WNL via oropharyngeal exam Neck-symmetric, no masses; thyroid nonpalpable and nontender Pulmonary-no tachypnea or accessory muscle use; Clear without wheezes via auscultation Card--no abnrml murmurs, rhythm reg and rate WNL Carotid pulses symmetric, without bruits Substantial baseline tremor noted     Assessment & Plan:  1 1 Parkinson's disease.  Discussed at length.  To maintain same therapy  2.  Hypertension.  Blood pressure borderline on repeat.  Not enough to initiate medicines rationale discussed  3.  Asthma clinically stable.  Rare use of albuterol fortunately symptom care discussed her graph #4 mild dysphoria/depression with loss of independence patient not willing to take medication at this time  Follow-up in 6 months diet exercise discussed

## 2018-03-01 ENCOUNTER — Ambulatory Visit (INDEPENDENT_AMBULATORY_CARE_PROVIDER_SITE_OTHER): Payer: PPO | Admitting: Neurology

## 2018-03-01 ENCOUNTER — Telehealth: Payer: Self-pay | Admitting: Neurology

## 2018-03-01 ENCOUNTER — Encounter: Payer: Self-pay | Admitting: Neurology

## 2018-03-01 VITALS — BP 171/70 | HR 58 | Ht 60.0 in | Wt 122.0 lb

## 2018-03-01 DIAGNOSIS — R413 Other amnesia: Secondary | ICD-10-CM | POA: Diagnosis not present

## 2018-03-01 DIAGNOSIS — G20 Parkinson's disease: Secondary | ICD-10-CM | POA: Diagnosis not present

## 2018-03-01 DIAGNOSIS — R269 Unspecified abnormalities of gait and mobility: Secondary | ICD-10-CM

## 2018-03-01 MED ORDER — CARBIDOPA-LEVODOPA 25-100 MG PO TABS: 0.5000 | ORAL_TABLET | ORAL | 2 refills | Status: DC

## 2018-03-01 MED ORDER — CARBIDOPA-LEVODOPA ER 25-100 MG PO TBCR: 1.0000 | EXTENDED_RELEASE_TABLET | Freq: Four times a day (QID) | ORAL | 2 refills | Status: DC

## 2018-03-01 NOTE — Telephone Encounter (Signed)
I called the patient to schedule for injection per Dr. Jannifer Franklin request in staff message. Patient did not answer and there was no option for VM.

## 2018-03-01 NOTE — Progress Notes (Signed)
Reason for visit: Parkinson's disease  Heather Gilmore is an 84 y.o. female  History of present illness:  Heather Gilmore is an 84 year old right-handed white female with a history of Parkinson's disease.  The patient has been on Sinemet CR taking 25/100 mg tablets 4 times daily.  She generally will get up around 9 AM and take her medication at 10 AM and spread out throughout the day, she does not go to bed until around 1 or 2 AM.  The patient has not had any falls.  She does have some hesitancy when initiating ambulation, she will use a cane for ambulation.  The patient has a history of esophageal stricture and she is now having problems with solids getting stuck in her throat.  She has ongoing problems with drooling.  She does have a mild memory problem but she does not wish to go on medications for memory.  The patient returns to this office for an evaluation.  She reports some vivid dreams at times, she denies hallucinations.  Past Medical History:  Diagnosis Date  . Arthritis   . Asthma   . Dysphagia   . Essential tremor   . Gait abnormality 10/17/2017  . 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 Hyperlipidemia   . Hypertension   . IBS (irritable bowel syndrome)   . Memory difficulty 10/17/2017  . Parkinson disease ([place])   . Rosacea   . Thyroid nodule     Past Surgical History:  Procedure Laterality Date  . APPENDECTOMY    . CATARACT EXTRACTION W/PHACO  05/03/2011   Procedure: CATARACT EXTRACTION PHACO AND INTRAOCULAR LENS PLACEMENT (IOC);  Surgeon: Elta Guadeloupe T. Gershon Crane;  Location: [facility];  Service: Ophthalmology;  Laterality: Right;  CDE: 7.52  . CATARACT EXTRACTION W/PHACO  05/17/2011   Procedure: CATARACT EXTRACTION PHACO AND INTRAOCULAR LENS PLACEMENT (IOC);  Surgeon: Elta Guadeloupe T. Gershon Crane;  Location: [facility];  Service: Ophthalmology;  Laterality: Left;  CDE 9.49  . CHOLECYSTECTOMY  6 yrs ago   aph-Dr Arnoldo Morale  . ESOPHAGEAL DILATION N/A 03/02/2016   Procedure: ESOPHAGEAL DILATION;   Surgeon: Rogene Houston, MD;  [facility];  Service: Endoscopy;  Laterality: N/A;  . ESOPHAGOGASTRODUODENOSCOPY N/A 03/02/2016   Procedure: ESOPHAGOGASTRODUODENOSCOPY (EGD);  Surgeon: Rogene Houston, MD;  [facility];  Service: Endoscopy;  Laterality: N/A;  1:25  . TONSILLECTOMY     as child    Family History  Problem Relation Age of Onset  . Anesthesia problems Neg Hx   . Hypotension Neg Hx   . Malignant hyperthermia Neg Hx   . Pseudochol deficiency Neg Hx     Social history:  reports that she quit smoking about 26 years ago. Her smoking use included cigarettes. She has a 10.00 pack-year smoking history. She has never used smokeless tobacco. She reports that she does not drink alcohol or use drugs.    Allergies  Allergen Reactions  . Dyazide [Hydrochlorothiazide W-Triamterene]   . Penicillins Other (See Comments)    Reaction: head felt on fire.    Medications:  Prior to Admission medications   Medication Sig Start Date End Date Taking? Authorizing Provider  acetaminophen (TYLENOL) 500 MG tablet Take 500 mg by mouth every 6 (six) hours as needed. For pain    Yes [provider]  albuterol (PROVENTIL HFA;VENTOLIN HFA) 108 (90 Base) MCG/ACT inhaler Please dispense two if possible. For shortness of breath 02/14/18  Yes Mikey Kirschner, MD  Carbidopa-Levodopa ER (SINEMET CR) 25-100 MG tablet controlled release Take 1 tablet by mouth 4 (four) times daily. 03/01/18  Yes Kathrynn Ducking, MD  furosemide (LASIX) 20 MG tablet One tablet up to 3 times a week for swelling 02/14/18  Yes Mikey Kirschner, MD  Vitamin D, Ergocalciferol, (DRISDOL) 50000 UNITS CAPS capsule Take 50,000 Units by mouth every 7 (seven) days.   Yes [provider]  carbidopa-levodopa (SINEMET IR) 25-100 MG tablet Take 0.5 tablets by mouth every morning. 03/01/18   Kathrynn Ducking, MD    ROS:  Out of a complete 14 system review of symptoms, the patient complains only of  the following symptoms, and all other reviewed systems are negative.  Walking problems Tremors Drooling  Blood pressure (!) 171/70, pulse (!) 58, height 5' (1.524 m), weight 122 lb (55.3 kg), SpO2 95 %.  Physical Exam  General: The patient is alert and cooperative at the time of the examination.  Skin: No significant peripheral edema is noted.   Neurologic Exam  Mental status: The patient is alert and oriented x 3 at the time of the examination. The Mini-Mental status examination done today shows a total score of 26/30.   Cranial nerves: Facial symmetry is present. Speech is normal, no aphasia or dysarthria is noted. Extraocular movements are full. Visual fields are full.  Mild masking of the face is seen.  Motor: The patient has good strength in all 4 extremities.  Sensory examination: Soft touch sensation is symmetric on the face, arms, and legs.  Coordination: The patient has good finger-nose-finger and heel-to-shin bilaterally.  Gait and station: The patient has some difficulty arising from a seated position.  Once up, she is able to ambulate independently, decreased arm swing is seen bilaterally.  The patient has hesitancy with initiation of walking and with turns.  Tandem gait was not attempted.  Romberg is negative.  Reflexes: Deep tendon reflexes are symmetric.   Assessment/Plan:  1.  Parkinson's disease  2.  Mild memory disturbance  3.  Drooling, sialorrhea  The patient is amenable to having Botox injections for the drooling.  The patient will be increased on the Sinemet taking the 25/100 mg IR tablet, 1/2 tablet in the morning.  The patient will follow-up in about 5 months.  The prescription for the Sinemet CR was also sent in.  Jill Alexanders MD 03/01/2018 10:52 AM  [facility] [address] [place], [place] [address]  Phone [phone] Fax [phone]

## 2018-03-01 NOTE — Patient Instructions (Signed)
   We will start Sinemet IR taking 1/2 tablet in the morning.  We will try to get Botox injections set up for the drooling.

## 2018-06-21 ENCOUNTER — Encounter: Payer: Self-pay | Admitting: Family Medicine

## 2018-06-21 ENCOUNTER — Ambulatory Visit (INDEPENDENT_AMBULATORY_CARE_PROVIDER_SITE_OTHER): Payer: PPO | Admitting: Family Medicine

## 2018-06-21 VITALS — BP 182/80 | Temp 98.4°F | Ht 60.0 in | Wt 120.0 lb

## 2018-06-21 DIAGNOSIS — K529 Noninfective gastroenteritis and colitis, unspecified: Secondary | ICD-10-CM

## 2018-06-21 NOTE — Progress Notes (Signed)
   Subjective:    Patient ID: Heather Gilmore, female    DOB: 02-Jun-1930, 84 y.o.   MRN: [id number]  HPI Patient is here today due to seeing mucus in her underwear. She says she woke up on Wednesday and it had oozed out into her underwear during the night. She says she does occasionally have diarrhea that oozes out,but this is not stool she says she says she most of the time is constipated and has some stools are hard little balls.She says she did have a "normal" looking stool today.    She says she has a feeling that she needs to have a bm.   No fever no chils feling cool all the time  Some abd discomfrt  In lowr abdomen  Today more of a normal bowel movement   Pos nausea no actual vomiting   Mainly has been in the last ouple of days     mpt ne    Review of Systems No headache, no major weight loss or weight gain, no chest pain no back pain abdominal pain no change in bowel habits complete ROS otherwise negative     Objective:   Physical Exam  Alert and oriented, vitals reviewed and stable, NAD ENT-TM's and ext canals WNL bilat via otoscopic exam Soft palate, tonsils and post pharynx WNL via oropharyngeal exam Neck-symmetric, no masses; thyroid nonpalpable and nontender Pulmonary-no tachypnea or accessory muscle use; Clear without wheezes via auscultation Card--no abnrml murmurs, rhythm reg and rate WNL Carotid pulses symmetric, without bruits Lower abdomen mild tenderness at most.  No rebound no guarding excellent bowel sounds rectal exam.  Soft stool evident.  Heme-negative  Impression mucousy stools with nonspecific abdominal symptoms.  Just a couple days duration.  Etiology unclear.  Patient exam completely benign.  Recommend we give this another week before major intervention rationale discussed      Assessment & Plan:

## 2018-07-24 ENCOUNTER — Encounter: Payer: Self-pay | Admitting: Neurology

## 2018-07-24 ENCOUNTER — Ambulatory Visit (INDEPENDENT_AMBULATORY_CARE_PROVIDER_SITE_OTHER): Payer: PPO | Admitting: Neurology

## 2018-07-24 VITALS — BP 151/70 | HR 64 | Ht 60.0 in | Wt 116.3 lb

## 2018-07-24 DIAGNOSIS — R413 Other amnesia: Secondary | ICD-10-CM

## 2018-07-24 DIAGNOSIS — G20 Parkinson's disease: Secondary | ICD-10-CM

## 2018-07-24 DIAGNOSIS — R471 Dysarthria and anarthria: Secondary | ICD-10-CM

## 2018-07-24 DIAGNOSIS — G259 Extrapyramidal and movement disorder, unspecified: Secondary | ICD-10-CM

## 2018-07-24 MED ORDER — CARBIDOPA-LEVODOPA 25-100 MG PO TABS
0.5000 | ORAL_TABLET | Freq: Four times a day (QID) | ORAL | 2 refills | Status: DC
Start: 2018-07-24 — End: 2018-12-26

## 2018-07-24 MED ORDER — BUPROPION HCL 75 MG PO TABS: 75.0000 mg | ORAL_TABLET | Freq: Every day | ORAL | 3 refills | Status: DC

## 2018-07-24 NOTE — Progress Notes (Signed)
Reason for visit: Parkinson's disease, memory disturbance  Heather Gilmore is an 84 y.o. female  History of present illness:  Heather Gilmore is an 84 year old right-handed white female with a history of Parkinson's disease.  The patient comes into the office today, she recently lost her husband.  The patient has had some problems with being depressed and emotional, this predates the death of her husband by quite some time.  She believes that her mobility has declined some since last seen.  The patient now lives alone, she is in a multilevel house unfortunately.  Her daughter lives in the same neighborhood, she checks on her frequently.  The patient does not operate a motor vehicle.  The patient is on Sinemet CR taking the 25/100 mg tablet 4 times daily, she takes the Sinemet IR 25/100 mg tablet, 1/2 tablet in the morning.  The patient tolerates the medication well.  She does report that solids get stuck in her throat frequently, she has had this problem in the past before the diagnosis of Parkinson's disease.  Past Medical History:  Diagnosis Date  . Arthritis   . Asthma   . Dysphagia   . Essential tremor   . Gait abnormality 10/17/2017  . 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 Hyperlipidemia   . Hypertension   . IBS (irritable bowel syndrome)   . Memory difficulty 10/17/2017  . Parkinson disease ([place])   . Rosacea   . Thyroid nodule     Past Surgical History:  Procedure Laterality Date  . APPENDECTOMY    . CATARACT EXTRACTION W/PHACO  05/03/2011   Procedure: CATARACT EXTRACTION PHACO AND INTRAOCULAR LENS PLACEMENT (IOC);  Surgeon: Elta Guadeloupe T. Gershon Crane;  Location: [facility];  Service: Ophthalmology;  Laterality: Right;  CDE: 7.52  . CATARACT EXTRACTION W/PHACO  05/17/2011   Procedure: CATARACT EXTRACTION PHACO AND INTRAOCULAR LENS PLACEMENT (IOC);  Surgeon: Elta Guadeloupe T. Gershon Crane;  Location: [facility];  Service: Ophthalmology;  Laterality: Left;  CDE 9.49  . CHOLECYSTECTOMY  6 yrs ago   aph-Dr  Arnoldo Morale  . ESOPHAGEAL DILATION N/A 03/02/2016   Procedure: ESOPHAGEAL DILATION;  Surgeon: Rogene Houston, MD;  Location: [facility];  Service: Endoscopy;  Laterality: N/A;  . ESOPHAGOGASTRODUODENOSCOPY N/A 03/02/2016   Procedure: ESOPHAGOGASTRODUODENOSCOPY (EGD);  Surgeon: Rogene Houston, MD;  Location: [facility];  Service: Endoscopy;  Laterality: N/A;  1:25  . TONSILLECTOMY     as child    Family History  Problem Relation Age of Onset  . Anesthesia problems Neg Hx   . Hypotension Neg Hx   . Malignant hyperthermia Neg Hx   . Pseudochol deficiency Neg Hx     Social history:  reports that she quit smoking about 27 years ago. Her smoking use included cigarettes. She has a 10.00 pack-year smoking history. She has never used smokeless tobacco. She reports that she does not drink alcohol or use drugs.    Allergies  Allergen Reactions  . Dyazide [Hydrochlorothiazide W-Triamterene]   . Penicillins Other (See Comments)    Reaction: head felt on fire.    Medications:  Prior to Admission medications   Medication Sig Start Date End Date Taking? Authorizing Provider  acetaminophen (TYLENOL) 500 MG tablet Take 500 mg by mouth every 6 (six) hours as needed. For pain    Yes [provider]  albuterol (PROVENTIL HFA;VENTOLIN HFA) 108 (90 Base) MCG/ACT inhaler Please dispense two if possible. For shortness of breath 02/14/18  Yes Luking,  Grace Bushy, MD  carbidopa-levodopa (SINEMET IR) 25-100 MG tablet Take 0.5 tablets by mouth every morning. 03/01/18  Yes Kathrynn Ducking, MD  Carbidopa-Levodopa ER (SINEMET CR) 25-100 MG tablet controlled release Take 1 tablet by mouth 4 (four) times daily. 03/01/18  Yes Kathrynn Ducking, MD  furosemide (LASIX) 20 MG tablet One tablet up to 3 times a week for swelling 02/14/18  Yes Mikey Kirschner, MD  Vitamin D, Ergocalciferol, (DRISDOL) 50000 UNITS CAPS capsule Take 50,000 Units by mouth every 7 (seven) days.   Yes [provider]     ROS:  Out of a complete 14 system review of symptoms, the patient complains only of the following symptoms, and all other reviewed systems are negative.   Fatigue Swallowing problems  Blood pressure (!) 151/70, pulse 64, height 5' (1.524 m), weight 116 lb 5 oz (52.8 kg).  Physical Exam  General: The patient is alert and cooperative at the time of the examination.  Skin: No significant peripheral edema is noted.   Neurologic Exam  Mental status: The patient is alert and oriented x 3 at the time of the examination. The patient has apparent normal recent and remote memory, with an apparently normal attention span and concentration ability.   Cranial nerves: Facial symmetry is present. Speech is normal, no aphasia or dysarthria is noted. Extraocular movements are full. Visual fields are full.  Masking of the face is seen.  The patient has a jaw tremor.  Motor: The patient has good strength in all 4 extremities.  Sensory examination: Soft touch sensation is symmetric on the face, arms, and legs.  Coordination: The patient has good finger-nose-finger and heel-to-shin bilaterally.   Resting tremors are noted on both upper extremities.  Gait and station: The patient is unable to arise from a seated position with arms crossed.  The patient is able to push off and stand on her own, she can walk with a cane, she has good stride.  Posture somewhat stooped.  Tandem gait was not attempted.  Romberg is negative.  Reflexes: Deep tendon reflexes are symmetric.   Assessment/Plan:  1.  Parkinson's disease  2.  Reports of dysphagia for solids  3.  Depression  The Sinemet dose will be increased taking one half of the Sinemet 25/100 mg IR tablet with each dose of the Sinemet CR.  75 mg of Wellbutrin will be added to the dosing regimen.  The patient will follow-up in 5 months.  Jill Alexanders MD 07/24/2018 2:06 PM  [facility] [address] [place],  [address]  Phone [phone] Fax [phone]

## 2018-07-24 NOTE — Patient Instructions (Signed)
We will start wellbutrin 75 mg daily.  Increase the sinemet IR 25/100 mg tablet taking 1/2 tablet with each dose of the Sinemet CR 25/100 mg tablets.  Sinemet (carbidopa) may result in confusion or hallucinations, drowsiness, nausea, or dizziness. If any significant side effects are noted, please contact our office. Sinemet may not be well absorbed when taken with high protein meals, if tolerated it is best to take 30-45 minutes before you eat.

## 2018-08-20 ENCOUNTER — Ambulatory Visit: Payer: PPO | Admitting: Family Medicine

## 2018-08-28 ENCOUNTER — Ambulatory Visit (INDEPENDENT_AMBULATORY_CARE_PROVIDER_SITE_OTHER): Payer: PPO | Admitting: Family Medicine

## 2018-08-28 ENCOUNTER — Encounter: Payer: Self-pay | Admitting: Family Medicine

## 2018-08-28 VITALS — BP 190/82 | Ht 60.0 in | Wt 115.0 lb

## 2018-08-28 DIAGNOSIS — I10 Essential (primary) hypertension: Secondary | ICD-10-CM

## 2018-08-28 DIAGNOSIS — Z79899 Other long term (current) drug therapy: Secondary | ICD-10-CM | POA: Diagnosis not present

## 2018-08-28 DIAGNOSIS — Z13220 Encounter for screening for lipoid disorders: Secondary | ICD-10-CM

## 2018-08-28 DIAGNOSIS — R5383 Other fatigue: Secondary | ICD-10-CM | POA: Diagnosis not present

## 2018-08-28 MED ORDER — FUROSEMIDE 20 MG PO TABS: ORAL_TABLET | ORAL | 5 refills | Status: DC

## 2018-08-28 MED ORDER — ALBUTEROL SULFATE HFA 108 (90 BASE) MCG/ACT IN AERS: INHALATION_SPRAY | RESPIRATORY_TRACT | 5 refills | Status: AC

## 2018-08-28 NOTE — Progress Notes (Signed)
   Subjective:    Patient ID: Heather Gilmore, female    DOB: November 27, 1929, 85 y.o.   MRN: [id number]  HPI Patient is here today to follow up on her chronic health issues.   She has a history of Hypertension and had been taking Lasix 20 mg one po prn edema. Her son Heather Gilmore) is with her today and she states she took herself off of her Lasix as she felt no need as her edema had gone down.She states she also stopped it as it caused her to have to use the restroom too often and it was confining to her.  She has a history of hyperlipidemia and controls this with diet.  She is on Wellbutrin 75 mg once per day,that was given to her by Dr.Charles Willis.Per pt this was given to her due to her Parkinson's and her anxiety and depression.   She also takes Carbidopa Cr 25-100 mg four times per day and Carbidopa Ir 1/2 bid for her Parkinson disease.  Per patient she would like you to look at the left ankle as it is painful with touch. Patient states it has been this way for months,but says the home health nurse touched it the other day and it was sensitive and asked to have it looked at.Patient does not remember injuring it.  Sadly patient also lost her spouse over 50 years plus last month to a heart attack/stroke.  Patient collapsed while driving and died suddenly.  Had a history of heart disease  Review of Systems No headache, no major weight loss or weight gain, no chest pain no back pain abdominal pain no change in bowel habits complete ROS otherwise negative     Objective:   Physical Exam  Alert and oriented, vitals reviewed and stable, NAD ENT-TM's and ext canals WNL bilat via otoscopic exam Soft palate, tonsils and post pharynx WNL via oropharyngeal exam Neck-symmetric, no masses; thyroid nonpalpable and nontender Pulmonary-no tachypnea or accessory muscle use; Clear without wheezes via auscultation Card--no abnrml murmurs, rhythm reg and rate WNL Carotid pulses symmetric, without  bruits Left arm blood pressure 134/78 right arm blood pressure 130/70  Positive substantial tremor     Assessment & Plan:  Impression 1 grief.  Discussed at length.  2.  Chronic depression anxiety.  Ongoing.  With #1 of course impacting  3.  Ankle pain.  No obvious distortion of ankle exam within normal limits  4.  Elevated blood pressure/hypertension.  Blood pressure on repeat excellent discussed  Screening blood work recommended  Greater than 50% of this 25 minute face to face visit was spent in counseling and discussion and coordination of care regarding the above diagnosis/diagnosies Follow-up in 6 months

## 2018-09-11 DIAGNOSIS — Z79899 Other long term (current) drug therapy: Secondary | ICD-10-CM | POA: Diagnosis not present

## 2018-09-11 DIAGNOSIS — I10 Essential (primary) hypertension: Secondary | ICD-10-CM | POA: Diagnosis not present

## 2018-09-11 DIAGNOSIS — R5383 Other fatigue: Secondary | ICD-10-CM | POA: Diagnosis not present

## 2018-09-11 DIAGNOSIS — Z13220 Encounter for screening for lipoid disorders: Secondary | ICD-10-CM | POA: Diagnosis not present

## 2018-09-12 LAB — TSH: TSH: 2.05 u[IU]/mL (ref 0.450–4.500)

## 2018-09-12 LAB — CBC WITH DIFFERENTIAL/PLATELET
Basophils Absolute: 0 10*3/uL (ref 0.0–0.2)
Basos: 0 %
EOS (ABSOLUTE): 0.1 10*3/uL (ref 0.0–0.4)
EOS: 2 %
HEMATOCRIT: 41.8 % (ref 34.0–46.6)
Hemoglobin: 13.4 g/dL (ref 11.1–15.9)
IMMATURE GRANULOCYTES: 0 %
Immature Grans (Abs): 0 10*3/uL (ref 0.0–0.1)
Lymphocytes Absolute: 1.8 10*3/uL (ref 0.7–3.1)
Lymphs: 26 %
MCH: 28.4 pg (ref 26.6–33.0)
MCHC: 32.1 g/dL (ref 31.5–35.7)
MCV: 89 fL (ref 79–97)
MONOCYTES: 8 %
MONOS ABS: 0.6 10*3/uL (ref 0.1–0.9)
NEUTROS PCT: 64 %
Neutrophils Absolute: 4.4 10*3/uL (ref 1.4–7.0)
PLATELETS: 305 10*3/uL (ref 150–450)
RBC: 4.72 x10E6/uL (ref 3.77–5.28)
RDW: 12.9 % (ref 11.7–15.4)
WBC: 7 10*3/uL (ref 3.4–10.8)

## 2018-09-12 LAB — HEPATIC FUNCTION PANEL
ALBUMIN: 4.2 g/dL (ref 3.6–4.6)
ALT: <5 IU/L (ref 0–32)
AST: 9 IU/L (ref 0–40)
Alkaline Phosphatase: 124 IU/L — ABNORMAL HIGH (ref 39–117)
Bilirubin Total: 0.5 mg/dL (ref 0.0–1.2)
Bilirubin, Direct: 0.14 mg/dL (ref 0.00–0.40)
TOTAL PROTEIN: 6.8 g/dL (ref 6.0–8.5)

## 2018-09-12 LAB — LIPID PANEL
Chol/HDL Ratio: 2.8 ratio (ref 0.0–4.4)
Cholesterol, Total: 184 mg/dL (ref 100–199)
HDL: 65 mg/dL (ref >39)
LDL CALC: 105 mg/dL — AB (ref 0–99)
Triglycerides: 72 mg/dL (ref 0–149)
VLDL CHOLESTEROL CAL: 14 mg/dL (ref 5–40)

## 2018-09-12 LAB — BASIC METABOLIC PANEL
BUN/Creatinine Ratio: 26 (ref 12–28)
BUN: 21 mg/dL (ref 8–27)
CALCIUM: 9.6 mg/dL (ref 8.7–10.3)
CHLORIDE: 104 mmol/L (ref 96–106)
CO2: 25 mmol/L (ref 20–29)
Creatinine, Ser: 0.81 mg/dL (ref 0.57–1.00)
GFR calc Af Amer: 76 mL/min/{1.73_m2} (ref >59)
GFR, EST NON AFRICAN AMERICAN: 66 mL/min/{1.73_m2} (ref >59)
Glucose: 95 mg/dL (ref 65–99)
POTASSIUM: 4.4 mmol/L (ref 3.5–5.2)
SODIUM: 142 mmol/L (ref 134–144)

## 2018-09-16 ENCOUNTER — Encounter: Payer: Self-pay | Admitting: Family Medicine

## 2018-10-07 IMAGING — CT CT ABD-PELV W/ CM
2 of 5 series · 16 of 46 positions shown, 18 images · IV contrast (Isovue)
Comparison: CT scan of July 18, 2006.

CLINICAL DATA: Nausea, vomiting.

EXAM:
CT ABDOMEN AND PELVIS WITH CONTRAST
TECHNIQUE: Multidetector CT imaging of the abdomen and pelvis was performed
using the standard protocol following bolus administration of
intravenous contrast.
CONTRAST:  100mL [id number]-EYY IOPAMIDOL ([id number]) INJECTION 61%

[Series 2: axial st · axial · 0.57mm/px · z∈[-531,-211]mm · 13 of 74 slices shown, 15 images]
[im 5/74  soft-tissue]
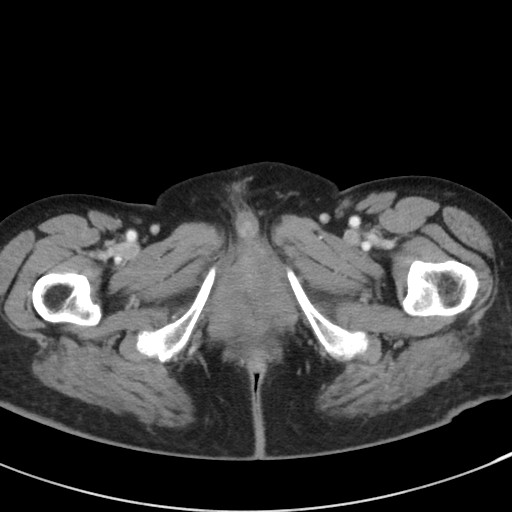
[im 5/74  bone]
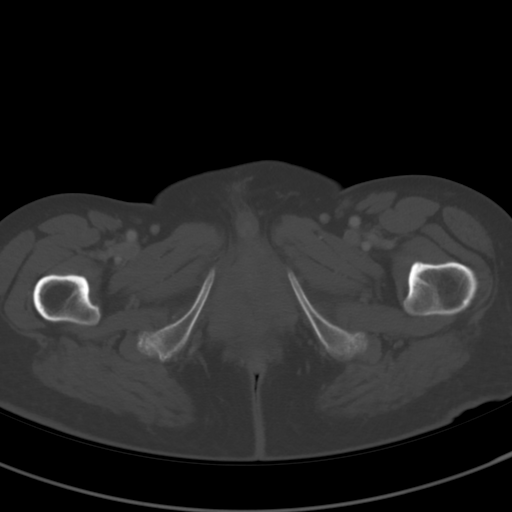
[im 9/74  soft-tissue]
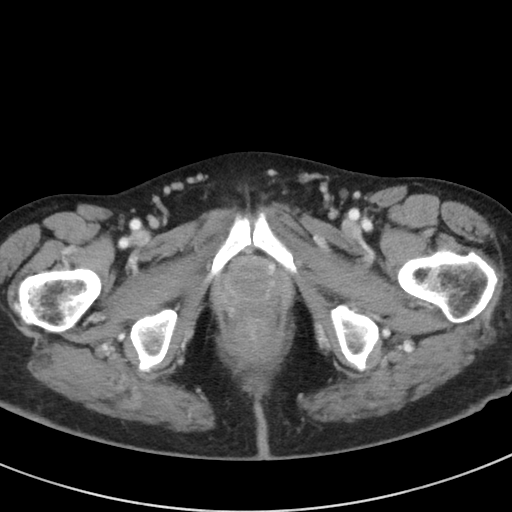
[im 17/74  soft-tissue]
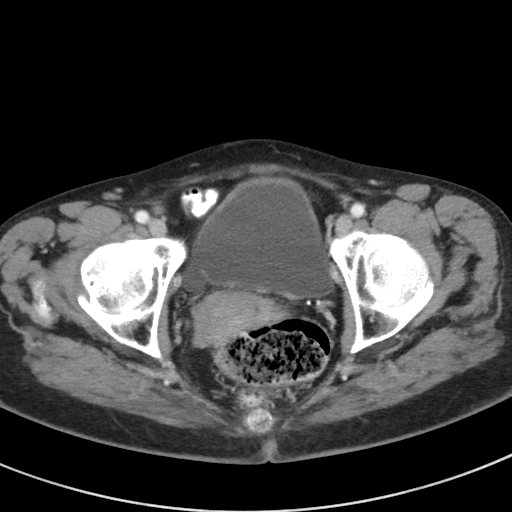
[im 21/74  soft-tissue]
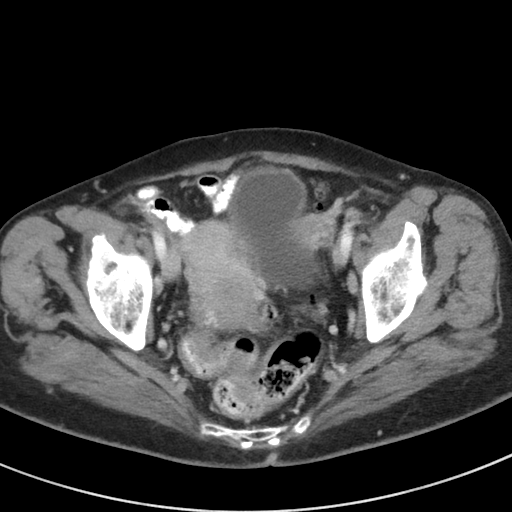
[im 25/74  soft-tissue]
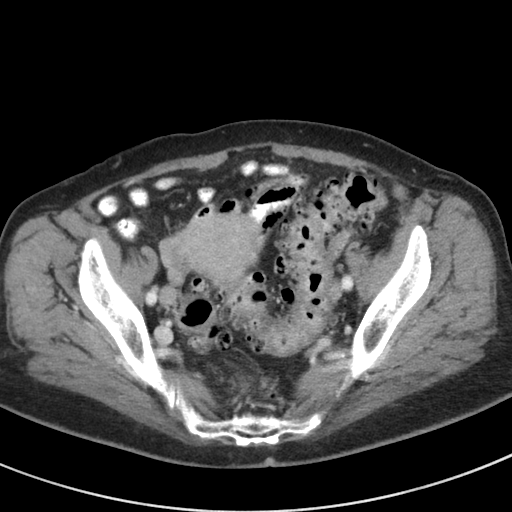
[im 33/74  soft-tissue]
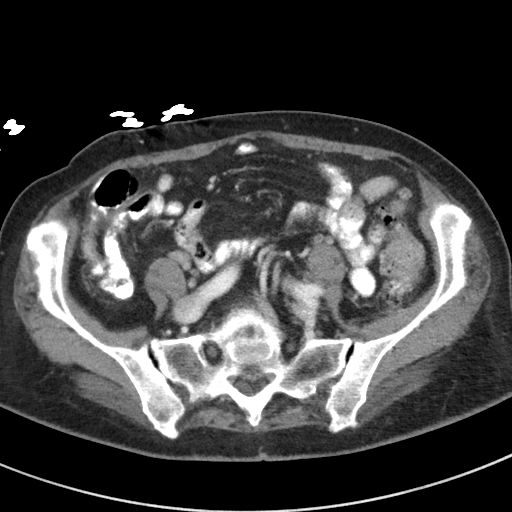
[im 37/74  soft-tissue]
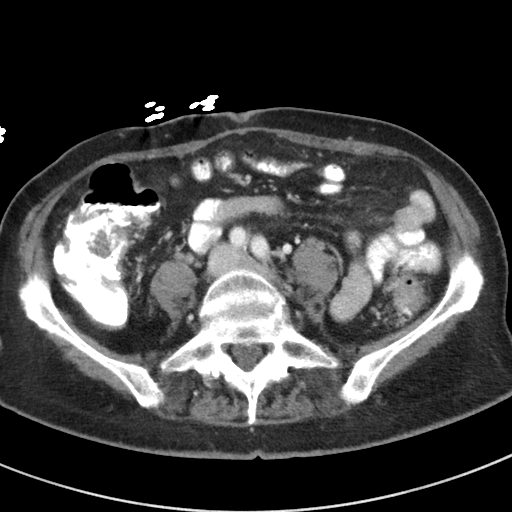
[im 41/74  soft-tissue]
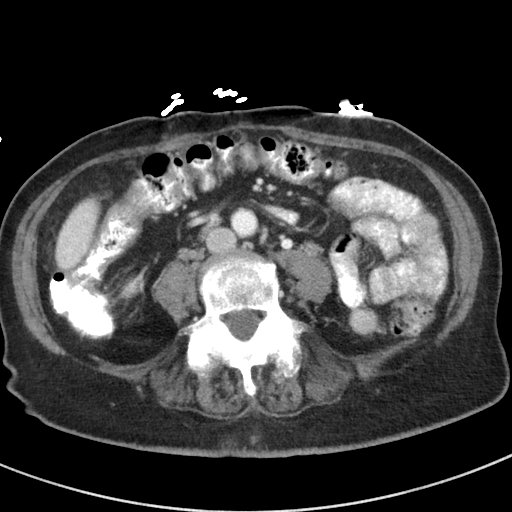
[im 49/74  soft-tissue]
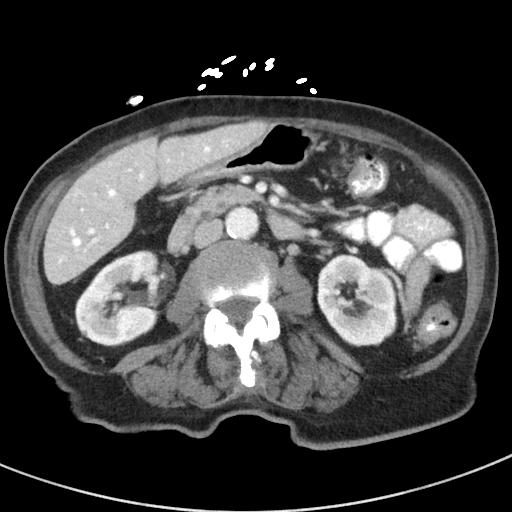
[im 49/74  bone]
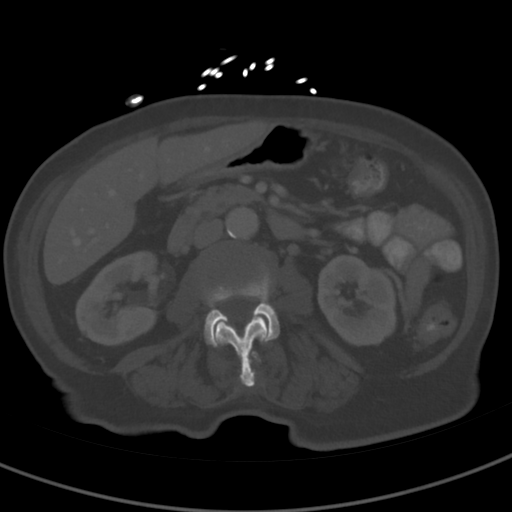
[im 53/74  soft-tissue]
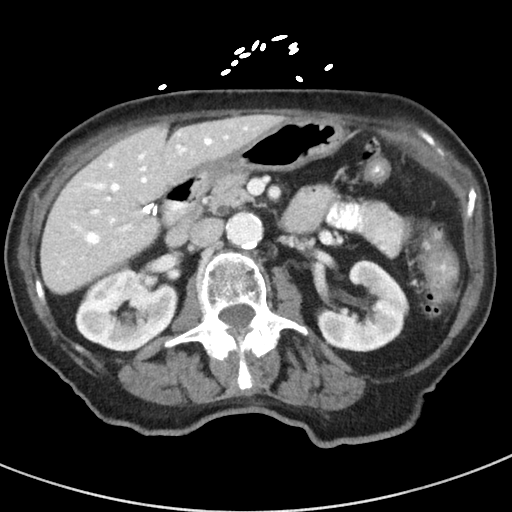
[im 57/74  soft-tissue]
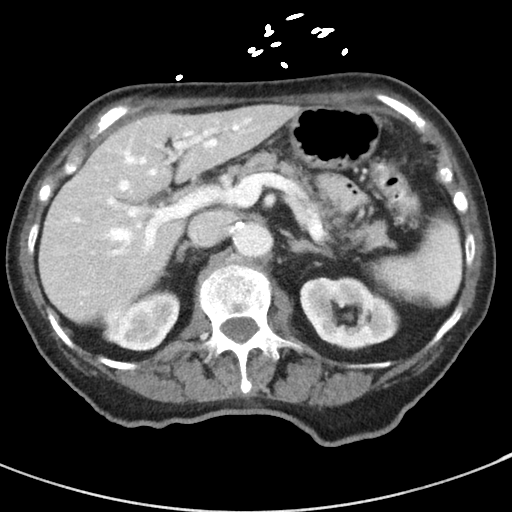
[im 65/74  soft-tissue]
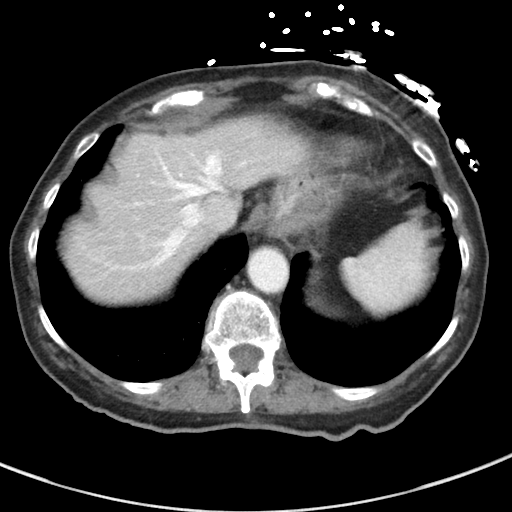
[im 69/74  soft-tissue]
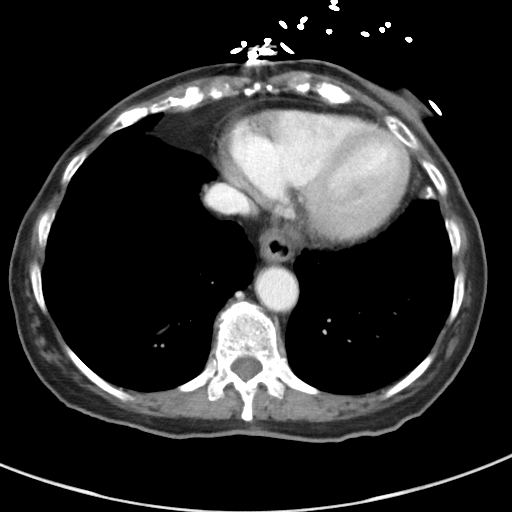

[Series 4: coronal st · coronal · 0.62mm/px · 3 of 76 slices shown]
[im 26/76  soft-tissue]
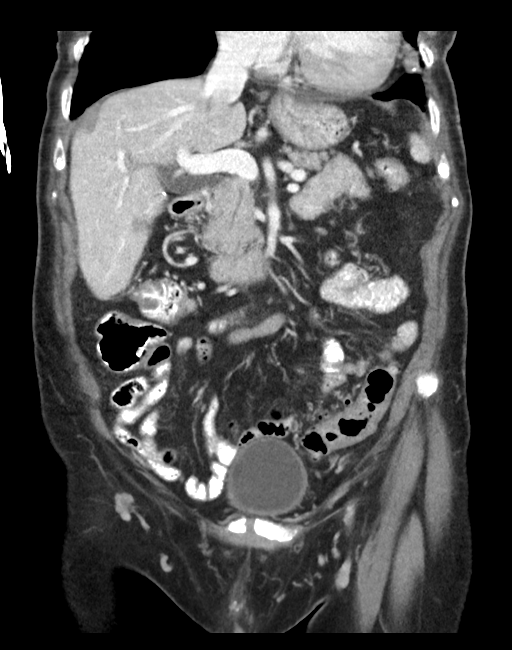
[im 34/76  soft-tissue]
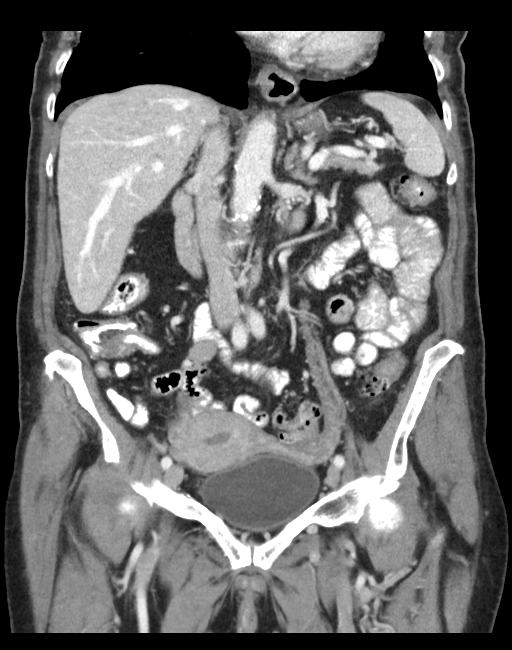
[im 42/76  soft-tissue]
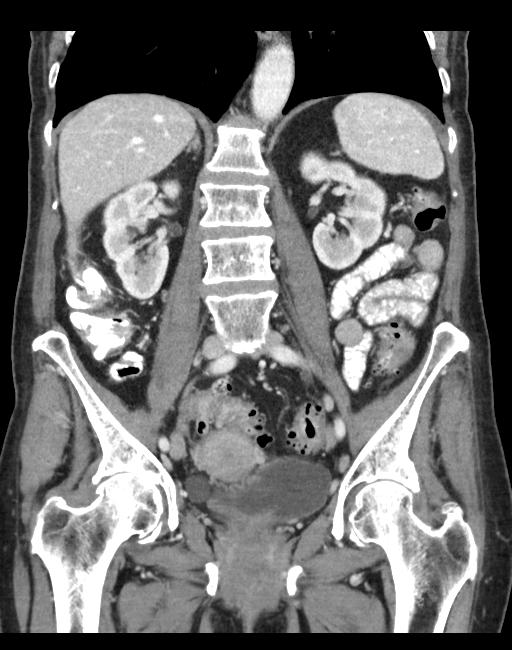

[16 of 46 positions shown; findings below may reference images not displayed]

FINDINGS: Lower chest: No acute abnormality.

Hepatobiliary: No focal liver abnormality is seen. Status post
cholecystectomy. No biliary dilatation.

Pancreas: Unremarkable. No pancreatic ductal dilatation or
surrounding inflammatory changes.

Spleen: Normal in size without focal abnormality.

Adrenals/Urinary Tract: Adrenal glands are unremarkable. Kidneys are
normal, without renal calculi, focal lesion, or hydronephrosis.
Bladder is unremarkable.

Stomach/Bowel: The stomach appears normal. There is no evidence of
bowel obstruction. Status post appendectomy. Diverticulosis of
descending and sigmoid colon is noted without acute inflammation.

Vascular/Lymphatic: Aortic atherosclerosis. No enlarged abdominal or
pelvic lymph nodes.

Reproductive: Uterus and bilateral adnexa are unremarkable.

Other: No abdominal wall hernia or abnormality. No abdominopelvic
ascites.

Musculoskeletal: No acute or significant osseous findings.
IMPRESSION: Diverticulosis of descending and sigmoid colon without inflammation.
Aortic atherosclerosis. No acute abnormality seen in the abdomen or
pelvis.

## 2018-11-01 ENCOUNTER — Ambulatory Visit (INDEPENDENT_AMBULATORY_CARE_PROVIDER_SITE_OTHER): Payer: PPO | Admitting: Internal Medicine

## 2018-11-24 ENCOUNTER — Other Ambulatory Visit: Payer: Self-pay | Admitting: Neurology

## 2018-12-24 ENCOUNTER — Telehealth: Payer: Self-pay | Admitting: Neurology

## 2018-12-24 NOTE — Telephone Encounter (Signed)
Due to current COVID 19 pandemic, our office is severely reducing in office visits until further notice, in order to minimize the risk to our patients and healthcare providers.   Called patient regarding 5/20 appt. I spoke with patient's daughter Wannetta Sender and confirmed a virtual visit for the appointment. Patient daughter verbalized understanding of the doxy.me process and I have sent her an e-mail to [email or web address] with link and directions as well as my name and office number/hours for reference. She understands that she will receive a call from RN to update chart. Best contact number for Wannetta Sender is [phone].  Pt understands that although there may be some limitations with this type of visit, we will take all precautions to reduce any security or privacy concerns.  Pt understands that this will be treated like an in office visit and we will file with pt's insurance, and there may be a patient responsible charge related to this service.

## 2018-12-24 NOTE — Telephone Encounter (Signed)
Noted pre charting will be complete closer to visit.

## 2018-12-25 NOTE — Addendum Note (Signed)
Addended by: Verlin Grills T on: 12/25/2018 02:20 PM   Modules accepted: Orders

## 2018-12-25 NOTE — Telephone Encounter (Signed)
I contacted the pt's daughter Arville Lime ( ok per dpr). Trish and I were able to update allergies, meds and pmh.   Daughter confirmed she had received doxy.me link.  Daughter did want MD notified since increasing sinemet IR 0.5 tab 4 daily her mothers hallucinations have increased. She reports auditory and visual hallucinations.

## 2018-12-26 ENCOUNTER — Other Ambulatory Visit: Payer: Self-pay

## 2018-12-26 ENCOUNTER — Ambulatory Visit (INDEPENDENT_AMBULATORY_CARE_PROVIDER_SITE_OTHER): Payer: PPO | Admitting: Neurology

## 2018-12-26 ENCOUNTER — Encounter: Payer: Self-pay | Admitting: Neurology

## 2018-12-26 DIAGNOSIS — G20 Parkinson's disease: Secondary | ICD-10-CM

## 2018-12-26 DIAGNOSIS — R443 Hallucinations, unspecified: Secondary | ICD-10-CM | POA: Insufficient documentation

## 2018-12-26 HISTORY — DX: Hallucinations, unspecified: R44.3

## 2018-12-26 MED ORDER — CARBIDOPA-LEVODOPA 25-100 MG PO TABS: ORAL_TABLET | ORAL | 2 refills | Status: DC

## 2018-12-26 NOTE — Progress Notes (Signed)
Virtual Visit via Video Note  I connected with Heather Gilmore on 12/26/18 at 11:30 AM EDT by a video enabled telemedicine application and verified that I am speaking with the correct person using two identifiers.  Location: Patient: The patient is at home. Provider: Physician is in the office.   I discussed the limitations of evaluation and management by telemedicine and the availability of in person appointments. The patient expressed understanding and agreed to proceed.  History of Present Illness: Heather Gilmore is an 85 year old right-handed white female with a history of Parkinson's disease.  The patient has been on Wellbutrin as well for some depression issues that were worse after her husband passed away.  The patient lives alone at home currently, her family members fortunately live close by, and a check on her frequently.  The patient is walking without an assistive device, she does have a few steps to go up and down on her split-level home, but she has a banister to hold onto.  The patient has been sleeping fairly well but she has begun having problems with visual hallucinations at night, she will see her father when she wakes up out of sleep.  During the evening hours, she will see bright flashes of light in the sky, she believes that the neighbors are doing this.  Occasionally she will call her daughter because she believes that there is someone in the light next-door, and she is worried about this.  The patient does not have hallucinations during the daytime.  She does have problems with esophageal stricture, she has problems with food getting stuck in her throat but she denies issues with choking when swallowing liquids.  She has had a recent fall when she took the dog out for a walk and then fell when she came back into the house.  This is the only fall she has sustained.  She did not hurt herself.  She has had increased hallucinations since the increase in Sinemet dosing on the last  visit.   Observations/Objective: The video evaluation reveals that the patient is alert and cooperative.  She is responding to questions appropriately, she has a normal speech pattern, no aphasia or dysarthria is noted.  The patient has masking the face but otherwise good facial symmetry.  She is able to protrude the tongue in the midline with good lateral movement of the tongue.  She has good finger-nose-finger and heel shin bilaterally.  Gait is associated with decreased arm swing, the patient can walk independently.  Some tremors are noted in the hands bilaterally.  Tandem gait was not attempted.  Romberg is negative, no drift is seen.  She needs to push off with her hands in order to stand up out of a seated position.  Assessment and Plan: 1.  Parkinson's disease  2.  Gait disorder  3.  Hallucinations, delusional thinking  The patient very well may be developing a mild memory problem, she has had some hallucinations and delusional thinking following an increase in her Sinemet dosing recently.  The patient will be cut back on the Sinemet IR tablet, she will take 1/2 tablet of the 25/100 mg tablets with the first and second doses, not with a third or fourth doses of the Sinemet CR.  She will continue the Sinemet CR as is.  She will follow-up here in 4 months.  She will continue on the Wellbutrin 75 mg daily.  Follow Up Instructions: 31-month follow-up with me.   I discussed the  assessment and treatment plan with the patient. The patient was provided an opportunity to ask questions and all were answered. The patient agreed with the plan and demonstrated an understanding of the instructions.   The patient was advised to call back or seek an in-person evaluation if the symptoms worsen or if the condition fails to improve as anticipated.  I provided 25 minutes of non-face-to-face time during this encounter.   Kathrynn Ducking, MD

## 2019-01-23 ENCOUNTER — Telehealth: Payer: Self-pay | Admitting: Neurology

## 2019-01-23 MED ORDER — BUPROPION HCL 75 MG PO TABS: 75.0000 mg | ORAL_TABLET | Freq: Every day | ORAL | 1 refills | Status: DC

## 2019-01-23 NOTE — Telephone Encounter (Signed)
Pt's daughter Armenta Erskin called wanting to know if the pt's buPROPion (WELLBUTRIN) 75 MG tablet can be sent in for a 90 day supply instead of a 30 day supply to [facility]. Please advise.

## 2019-01-23 NOTE — Telephone Encounter (Signed)
90 day rx is appropriate. Rx submitted to [place].

## 2019-02-25 ENCOUNTER — Other Ambulatory Visit: Payer: Self-pay

## 2019-02-26 ENCOUNTER — Ambulatory Visit (INDEPENDENT_AMBULATORY_CARE_PROVIDER_SITE_OTHER): Payer: PPO | Admitting: Family Medicine

## 2019-02-26 DIAGNOSIS — I10 Essential (primary) hypertension: Secondary | ICD-10-CM

## 2019-02-26 DIAGNOSIS — G20 Parkinson's disease: Secondary | ICD-10-CM

## 2019-02-26 DIAGNOSIS — M1611 Unilateral primary osteoarthritis, right hip: Secondary | ICD-10-CM | POA: Diagnosis not present

## 2019-02-26 DIAGNOSIS — M25551 Pain in right hip: Secondary | ICD-10-CM

## 2019-02-26 DIAGNOSIS — J4520 Mild intermittent asthma, uncomplicated: Secondary | ICD-10-CM | POA: Diagnosis not present

## 2019-02-26 DIAGNOSIS — R5383 Other fatigue: Secondary | ICD-10-CM | POA: Diagnosis not present

## 2019-02-26 DIAGNOSIS — G20A1 Parkinson's disease without dyskinesia, without mention of fluctuations: Secondary | ICD-10-CM

## 2019-02-26 MED ORDER — FUROSEMIDE 20 MG PO TABS: ORAL_TABLET | ORAL | 5 refills | Status: DC

## 2019-02-26 NOTE — Progress Notes (Signed)
   Subjective:    Patient ID: Heather Gilmore, female    DOB: Jan 09, 1930, 85 y.o.   MRN: [id number]  HPIMed check up.  Pt is with daughter Wannetta Sender.  states her hip has been bothering her recently and having more trouble getting around. Sleeping a lot.  Takes meds every day.   Virtual Visit via Video Note  I connected with Kara Pacer on 02/26/19 at  2:00 PM EDT by a video enabled telemedicine application and verified that I am speaking with the correct person using two identifiers.  Location: Patient: home Provider: office   I discussed the limitations of evaluation and management by telemedicine and the availability of in person appointments. The patient expressed understanding and agreed to proceed.  History of Present Illness:    Observations/Objective:   Assessment and Plan:   Follow Up Instructions:    I discussed the assessment and treatment plan with the patient. The patient was provided an opportunity to ask questions and all were answered. The patient agreed with the plan and demonstrated an understanding of the instructions.   The patient was advised to call back or seek an in-person evaluation if the symptoms worsen or if the condition fails to improve as anticipated.  I provided 25 minutes of non-face-to-face time during this encounter.  Patient notes overall asthma is stable.  Uses albuterol rarely.  Rare flares.  Generally the albuterol does fine help and it  Continues to have challenges with her Parkinson's disease.  Higher doses of the Sinemet caused excessive fatigue now back on lower dosage.  Patient notes progressive hip pain.  Fairly severe at times.  Has seen orthopedist in the past years ago with injection which helped.  Requests another referral for potential injection.  Old notes reviewed old x-ray reports reviewed.  Patient had fairly advanced osteoarthritis already several years ago    Review of Systems No headache, no major weight loss or weight  gain, no chest pain no back pain abdominal pain no change in bowel habits complete ROS otherwise negative     Objective:   Physical Exam   Virtual     Assessment & Plan:  Impression #1 asthma clinically stable to maintain PRN warning signs discussed.  Albuterol  2.  Parkinson's disease.  Followed by specialist  3.  Progressive hip pain.  Discussion held.  May well need another injection or perhaps even more substantial intervention.  Discussed with patient.  Orthopedic referral  Medications refilled follow-up in 6 months

## 2019-02-27 ENCOUNTER — Encounter: Payer: Self-pay | Admitting: Family Medicine

## 2019-02-28 ENCOUNTER — Encounter: Payer: Self-pay | Admitting: Family Medicine

## 2019-03-07 ENCOUNTER — Other Ambulatory Visit: Payer: Self-pay | Admitting: Neurology

## 2019-03-07 ENCOUNTER — Other Ambulatory Visit: Payer: Self-pay | Admitting: Family Medicine

## 2019-03-08 NOTE — Telephone Encounter (Signed)
No, this has to be rxed by her specialist as this one was

## 2019-03-21 ENCOUNTER — Ambulatory Visit (INDEPENDENT_AMBULATORY_CARE_PROVIDER_SITE_OTHER): Payer: PPO

## 2019-03-21 ENCOUNTER — Encounter: Payer: Self-pay | Admitting: Orthopaedic Surgery

## 2019-03-21 ENCOUNTER — Ambulatory Visit (INDEPENDENT_AMBULATORY_CARE_PROVIDER_SITE_OTHER): Payer: PPO | Admitting: Orthopaedic Surgery

## 2019-03-21 ENCOUNTER — Other Ambulatory Visit: Payer: Self-pay

## 2019-03-21 VITALS — BP 172/85 | HR 60 | Ht 60.0 in | Wt 112.0 lb

## 2019-03-21 DIAGNOSIS — M25551 Pain in right hip: Secondary | ICD-10-CM

## 2019-03-21 DIAGNOSIS — M1611 Unilateral primary osteoarthritis, right hip: Secondary | ICD-10-CM

## 2019-03-21 MED ORDER — BUPIVACAINE HCL 0.25 % IJ SOLN
4.0000 mL | INTRAMUSCULAR | Status: AC | PRN
  Administered 2019-03-21: 4 mL via INTRA_ARTICULAR

## 2019-03-21 MED ORDER — LIDOCAINE HCL 1 % IJ SOLN
0.5000 mL | INTRAMUSCULAR | Status: AC | PRN
  Administered 2019-03-21: .5 mL

## 2019-03-21 MED ORDER — METHYLPREDNISOLONE ACETATE 40 MG/ML IJ SUSP
40.0000 mg | INTRAMUSCULAR | Status: AC | PRN
  Administered 2019-03-21: 40 mg via INTRA_ARTICULAR

## 2019-03-21 NOTE — Progress Notes (Signed)
Office Visit Note   Patient: Heather Gilmore           Date of Birth: January 25, 1930           MRN: [id number] Visit Date: 03/21/2019              Requested by: Mikey Kirschner, [place],  [place] [address] PCP: Mikey Kirschner, MD   Assessment & Plan: Visit Diagnoses:  1. Pain in right hip   2. Unilateral primary osteoarthritis, right hip     Plan: Patient tolerated the right anterior hip injection well with spinal needle.  Postinjection she could walk better with good relief of pain.  I can recheck her in 14 weeks.  Follow-Up Instructions: No follow-ups on file.   Orders:  Orders Placed This Encounter  Procedures  . XR HIP UNILAT W OR W/O PELVIS 2-3 VIEWS RIGHT   No orders of the defined types were placed in this encounter.     Procedures: Large Joint Inj: R hip joint on 03/21/2019 1:56 PM Details: anterior approach Medications: 0.5 mL lidocaine 1 %; 40 mg methylPREDNISolone acetate 40 MG/ML; 4 mL bupivacaine 0.25 %      Clinical Data: No additional findings.   Subjective: Chief Complaint  Patient presents with  . Right Hip - Pain    HPI 85 year old female with Parkinson's had a previous injection in her hip by me several years ago.  Recently she has had increased pain states her pain is as bad as an 8 out of 10.  She underwent previous gallbladder surgery did well patient is a widow she is here with a younger family member.  She does have history of reflux DVT cataracts hypertension and rheumatic fever when she was young.  Review of Systems positive for Parkinson's with tremor.  Positive for asthma, hypertension, severe osteoarthritis right hip, urge incontinence some memory difficulties and history of syncope.  Otherwise negative as it pertains to HPI.   Objective: Vital Signs: BP (!) 172/85   Pulse 60   Ht 5' (1.524 m)   Wt 112 lb (50.8 kg)   BMI 21.87 kg/m   Physical Exam Constitutional:      Appearance: She is  well-developed.     Comments: Bilateral hand tremor, constant.  HENT:     Head: Normocephalic.     Right Ear: External ear normal.     Left Ear: External ear normal.  Eyes:     Pupils: Pupils are equal, round, and reactive to light.  Neck:     Thyroid: No thyromegaly.     Trachea: No tracheal deviation.  Cardiovascular:     Rate and Rhythm: Normal rate.  Pulmonary:     Effort: Pulmonary effort is normal.  Abdominal:     Palpations: Abdomen is soft.  Skin:    General: Skin is warm and dry.  Neurological:     Mental Status: She is alert and oriented to person, place, and time.  Psychiatric:        Behavior: Behavior normal.     Ortho Exam patient has 10 degree hip flexion contracture right hip.  Limited internal rotation only 10 degrees with severe pain.  Opposite left hip is internal/external rotation without discomfort.  Specialty Comments:  No specialty comments available.  Imaging: No results found.   PMFS History: Patient Active Problem List   Diagnosis Date Noted  . Unilateral primary osteoarthritis, right hip 03/21/2019  . Hallucinations 12/26/2018  .  Gait abnormality 10/17/2017  . Memory difficulty 10/17/2017  . Urge incontinence 12/19/2013  . 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 Parkinson's disease ([place]) 05/28/2013  . Essential hypertension 06/09/2009  . Asthma 06/09/2009  . GERD 06/09/2009  . SYNCOPE 06/09/2009  . Abnormal involuntary movement 06/09/2009   Past Medical History:  Diagnosis Date  . Arthritis   . Asthma   . Dysphagia   . Essential tremor   . Gait abnormality 10/17/2017  . Hallucinations 12/26/2018  . 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 Hyperlipidemia   . Hypertension   . IBS (irritable bowel syndrome)   . Memory difficulty 10/17/2017  . Parkinson disease ([place])   . Rosacea   . Thyroid nodule     Family History  Problem Relation Age of Onset  . Anesthesia problems Neg Hx   . Hypotension Neg Hx   . Malignant  hyperthermia Neg Hx   . Pseudochol deficiency Neg Hx     Past Surgical History:  Procedure Laterality Date  . APPENDECTOMY    . CATARACT EXTRACTION W/PHACO  05/03/2011   Procedure: CATARACT EXTRACTION PHACO AND INTRAOCULAR LENS PLACEMENT (IOC);  Surgeon: Elta Guadeloupe T. Gershon Crane;  Location: [facility];  Service: Ophthalmology;  Laterality: Right;  CDE: 7.52  . CATARACT EXTRACTION W/PHACO  05/17/2011   Procedure: CATARACT EXTRACTION PHACO AND INTRAOCULAR LENS PLACEMENT (IOC);  Surgeon: Elta Guadeloupe T. Gershon Crane;  Location: [facility];  Service: Ophthalmology;  Laterality: Left;  CDE 9.49  . CHOLECYSTECTOMY  6 yrs ago   aph-Dr Arnoldo Morale  . ESOPHAGEAL DILATION N/A 03/02/2016   Procedure: ESOPHAGEAL DILATION;  Surgeon: Rogene Houston, MD;  [facility];  Service: Endoscopy;  Laterality: N/A;  . ESOPHAGOGASTRODUODENOSCOPY N/A 03/02/2016   Procedure: ESOPHAGOGASTRODUODENOSCOPY (EGD);  Surgeon: Rogene Houston, MD;  [facility];  Service: Endoscopy;  Laterality: N/A;  1:25  . TONSILLECTOMY     as child   Social History   Occupational History  . Occupation: Retired  Tobacco Use  . Smoking status: Former Smoker    Packs/day: 1.00    Years: 10.00    Pack years: 10.00    Types: Cigarettes    Quit date: 04/28/1991    Years since quitting: 27.9  . Smokeless tobacco: Never Used  Substance and Sexual Activity  . Alcohol use: No  . Drug use: No  . Sexual activity: Not Currently

## 2019-04-02 DIAGNOSIS — H26493 Other secondary cataract, bilateral: Secondary | ICD-10-CM | POA: Diagnosis not present

## 2019-04-02 DIAGNOSIS — H52203 Unspecified astigmatism, bilateral: Secondary | ICD-10-CM | POA: Diagnosis not present

## 2019-04-02 DIAGNOSIS — H353131 Nonexudative age-related macular degeneration, bilateral, early dry stage: Secondary | ICD-10-CM | POA: Diagnosis not present

## 2019-04-02 DIAGNOSIS — H5213 Myopia, bilateral: Secondary | ICD-10-CM | POA: Diagnosis not present

## 2019-04-02 DIAGNOSIS — Z961 Presence of intraocular lens: Secondary | ICD-10-CM | POA: Diagnosis not present

## 2019-04-02 DIAGNOSIS — H524 Presbyopia: Secondary | ICD-10-CM | POA: Diagnosis not present

## 2019-04-10 ENCOUNTER — Encounter: Payer: Self-pay | Admitting: Neurology

## 2019-04-10 ENCOUNTER — Other Ambulatory Visit: Payer: Self-pay

## 2019-04-10 ENCOUNTER — Ambulatory Visit: Payer: PPO | Admitting: Neurology

## 2019-04-10 VITALS — BP 140/82 | HR 61 | Temp 98.4°F | Ht 60.0 in | Wt 120.2 lb

## 2019-04-10 DIAGNOSIS — G20 Parkinson's disease: Secondary | ICD-10-CM | POA: Diagnosis not present

## 2019-04-10 DIAGNOSIS — R443 Hallucinations, unspecified: Secondary | ICD-10-CM

## 2019-04-10 NOTE — Progress Notes (Signed)
Reason for visit: Parkinson's disease  Heather Gilmore is an 85 y.o. female  History of present illness:  Heather Gilmore is an 85 year old right-handed white female with a history of Parkinson's disease associated with gait disorder.  The patient has had some problems with hallucinations and delusional thinking when she was placed on low-dose Sinemet taking the 25/100 mg IR tablets, 1/2 tablet 4 times daily with the Sinemet CR.  Since cutting back to 1/2 tablet twice during the day, she has done better in this regard.  She has had some problems with gait instability, she uses a cane for ambulation.  She has had 1 fall since May 2020.  She has problems with esophageal stricture, she is getting solid food caught in her throat.  She has not had a dilation done recently.  She reports that she is sleeping fairly well at night, she does have drowsiness during the day.  She reports some problems with the memory, she has word finding problems.  She reports a lot of dizziness with standing, she believes this may be a factor in her gait instability.   Past Medical History:  Diagnosis Date  . Arthritis   . Asthma   . Dysphagia   . Essential tremor   . Gait abnormality 10/17/2017  . Hallucinations 12/26/2018  . 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 Hyperlipidemia   . Hypertension   . IBS (irritable bowel syndrome)   . Memory difficulty 10/17/2017  . Parkinson disease ([place])   . Rosacea   . Thyroid nodule     Past Surgical History:  Procedure Laterality Date  . APPENDECTOMY    . CATARACT EXTRACTION W/PHACO  05/03/2011   Procedure: CATARACT EXTRACTION PHACO AND INTRAOCULAR LENS PLACEMENT (IOC);  Surgeon: Elta Guadeloupe T. Gershon Crane;  Location: [facility];  Service: Ophthalmology;  Laterality: Right;  CDE: 7.52  . CATARACT EXTRACTION W/PHACO  05/17/2011   Procedure: CATARACT EXTRACTION PHACO AND INTRAOCULAR LENS PLACEMENT (IOC);  Surgeon: Elta Guadeloupe T. Gershon Crane;  Location: [facility];  Service: Ophthalmology;  Laterality:  Left;  CDE 9.49  . CHOLECYSTECTOMY  6 yrs ago   aph-Dr Arnoldo Morale  . ESOPHAGEAL DILATION N/A 03/02/2016   Procedure: ESOPHAGEAL DILATION;  Surgeon: Rogene Houston, MD;  [facility];  Service: Endoscopy;  Laterality: N/A;  . ESOPHAGOGASTRODUODENOSCOPY N/A 03/02/2016   Procedure: ESOPHAGOGASTRODUODENOSCOPY (EGD);  Surgeon: Rogene Houston, MD;  [facility];  Service: Endoscopy;  Laterality: N/A;  1:25  . TONSILLECTOMY     as child    Family History  Problem Relation Age of Onset  . Anesthesia problems Neg Hx   . Hypotension Neg Hx   . Malignant hyperthermia Neg Hx   . Pseudochol deficiency Neg Hx     Social history:  reports that she quit smoking about 27 years ago. Her smoking use included cigarettes. She has a 10.00 pack-year smoking history. She has never used smokeless tobacco. She reports that she does not drink alcohol or use drugs.    Allergies  Allergen Reactions  . Dyazide [Hydrochlorothiazide W-Triamterene]   . Penicillins Other (See Comments)    Reaction: head felt on fire.    Medications:  Prior to Admission medications   Medication Sig Start Date End Date Taking? Authorizing Provider  acetaminophen (TYLENOL) 500 MG tablet Take 500 mg by mouth every 6 (six) hours as needed. For pain    Yes [provider]  albuterol (PROVENTIL HFA;VENTOLIN HFA) 108 (90 Base)  MCG/ACT inhaler Please dispense two if possible. For shortness of breath 08/28/18  Yes Mikey Kirschner, MD  buPROPion [facility]) 75 MG tablet Take 1 tablet (75 mg total) by mouth daily. 01/23/19  Yes Kathrynn Ducking, MD  carbidopa-levodopa (SINEMET IR) 25-100 MG tablet 1/2 tablet with the first and second doses of Sinemet CR 12/26/18  Yes Kathrynn Ducking, MD  Carbidopa-Levodopa ER (SINEMET CR) 25-100 MG tablet controlled release TAKE (1) TABLET BY MOUTH (4) TIMES DAILY. 03/11/19  Yes Kathrynn Ducking, MD  Fexofenadine HCl (ALLEGRA PO) Take by mouth.   Yes [provider]   furosemide (LASIX) 20 MG tablet One tablet up to 3 times a week for swelling 02/26/19  Yes Mikey Kirschner, MD    ROS:  Out of a complete 14 system review of symptoms, the patient complains only of the following symptoms, and all other reviewed systems are negative.  Dizziness Walking problems Difficulty swallowing Memory problems  Blood pressure 140/82, pulse 61, temperature 98.4 F (36.9 C), temperature source Temporal, height 5' (1.524 m), weight 120 lb 4 oz (54.5 kg), SpO2 95 %.  Physical Exam  General: The patient is alert and cooperative at the time of the examination.  Skin: No significant peripheral edema is noted.   Neurologic Exam  Mental status: The patient is alert and oriented x 3 at the time of the examination. The Mini-Mental status examination done today shows a total score 25/30.   Cranial nerves: Facial symmetry is present. Speech is normal, no aphasia or dysarthria is noted. Extraocular movements are full. Visual fields are full.  Masking of the face is seen.  Motor: The patient has good strength in all 4 extremities.  Sensory examination: Soft touch sensation is symmetric on the face, arms, and legs.  Coordination: The patient has good finger-nose-finger and heel-to-shin bilaterally.  Resting tremors are noted with both upper extremities.  Gait and station: The patient has a shuffling type gait, stooped posture, decreased arm swing bilaterally.  Tandem gait is not attempted.  Romberg is negative.  The patient normally walks with a cane.  Reflexes: Deep tendon reflexes are symmetric.   Assessment/Plan:  1.  Parkinson's disease  2.  Gait disorder, falls  3.  Dysphagia for solids, esophageal stricture  4.  Memory disorder  The patient will be followed for the memory.  She lives at home, she has assistance throughout the day.  She is not very active during the day, I have encouraged her to exercise on a regular basis.  She will be set up for  physical therapy for gait training.  She will continue her current dose of Sinemet and follow-up in 5 months.  The daughter will set up an appointment with a gastroenterologist regarding her esophageal stricture.   Jill Alexanders MD 04/10/2019 11:14 AM  [facility] [address] [place], [place] [address]  Phone [phone] Fax [phone]

## 2019-04-23 ENCOUNTER — Other Ambulatory Visit: Payer: Self-pay

## 2019-04-23 ENCOUNTER — Ambulatory Visit (HOSPITAL_COMMUNITY): Payer: PPO | Attending: Neurology | Admitting: Physical Therapy

## 2019-04-23 DIAGNOSIS — R262 Difficulty in walking, not elsewhere classified: Secondary | ICD-10-CM | POA: Diagnosis not present

## 2019-04-23 DIAGNOSIS — Z9181 History of falling: Secondary | ICD-10-CM | POA: Insufficient documentation

## 2019-04-23 NOTE — Therapy (Signed)
[place], [place], [address] Phone: [phone]   Fax:  [phone]  Physical Therapy Evaluation  Patient Details  Name: Heather Gilmore MRN: [id number] Date of Birth: 10/31/1929 Referring Provider (PT): Kathrynn Ducking   Encounter Date: 04/23/2019  PT End of Session - 04/23/19 1131    Visit Number  1    Number of Visits  12    Date for PT Re-Evaluation  05/14/19    Authorization Type  [employer] - no auth req. no visit limit    Authorization Time Period  04/23/19 to 06/04/19    Authorization - Visit Number  1    Authorization - Number of Visits  10    PT Start Time  [id number]    PT Stop Time  1215    PT Time Calculation (min)  40 min    Activity Tolerance  Patient limited by lethargy;Patient limited by pain    Behavior During Therapy  Anxious       Past Medical History:  Diagnosis Date  . Arthritis   . Asthma   . Dysphagia   . Essential tremor   . Gait abnormality 10/17/2017  . Hallucinations 12/26/2018  . 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 Hyperlipidemia   . Hypertension   . IBS (irritable bowel syndrome)   . Memory difficulty 10/17/2017  . Parkinson disease ([place])   . Rosacea   . Thyroid nodule     Past Surgical History:  Procedure Laterality Date  . APPENDECTOMY    . CATARACT EXTRACTION W/PHACO  05/03/2011   Procedure: CATARACT EXTRACTION PHACO AND INTRAOCULAR LENS PLACEMENT (IOC);  Surgeon: Elta Guadeloupe T. Gershon Crane;  Location: [facility];  Service: Ophthalmology;  Laterality: Right;  CDE: 7.52  . CATARACT EXTRACTION W/PHACO  05/17/2011   Procedure: CATARACT EXTRACTION PHACO AND INTRAOCULAR LENS PLACEMENT (IOC);  Surgeon: Elta Guadeloupe T. Gershon Crane;  Location: [facility];  Service: Ophthalmology;  Laterality: Left;  CDE 9.49  . CHOLECYSTECTOMY  6 yrs ago   aph-Dr Arnoldo Morale  . ESOPHAGEAL DILATION N/A 03/02/2016   Procedure: ESOPHAGEAL DILATION;  Surgeon: Rogene Houston, MD;  [facility];  Service:  Endoscopy;  Laterality: N/A;  . ESOPHAGOGASTRODUODENOSCOPY N/A 03/02/2016   Procedure: ESOPHAGOGASTRODUODENOSCOPY (EGD);  Surgeon: Rogene Houston, MD;  [facility];  Service: Endoscopy;  Laterality: N/A;  1:25  . TONSILLECTOMY     as child    There were no vitals filed for this visit.   Subjective Assessment - 04/23/19 1141    Subjective  Patient reports that she has a history of falling, falling at least 10 times in the last 6 months. She reports that currently she lives alone because her husband recently passed away. Reports that she has family nearby who checks on her throughout the day and has a life alert she wears all the time. States she walks with a cane but doesn't use it when she goes up/down the stairs in her home (lives in a split level). States she feels very off balance and is slightly worried about physical therapy. States when she was here last time she had blood pressure issues with walking. (patient was seen over three years ago at this clinic). Patient reports she doesn't really know why she is here, as her pain is better after her injection in her hip.    Patient is accompained by:  Family member   grandson Edison Nasuti   Pertinent History  Parkingson's Disease, hx of falls    Limitations  Standing;Walking    How long can you sit comfortably?  no difficulty    How long can you stand comfortably?  unsure    How long can you walk comfortably?  one minute max    Patient Stated Goals  to fall less    Currently in Pain?  Yes    Pain Score  3     Pain Location  Hip    Pain Orientation  Right;Left    Pain Descriptors / Indicators  Aching;Sharp    Pain Type  Chronic pain    Pain Onset  More than a month ago    Pain Frequency  Intermittent    Pain Relieving Factors  rest, injections    Multiple Pain Sites  No         [facility] PT Assessment - 04/23/19 0001      Assessment   Medical Diagnosis  Parkinson's disease     Referring Provider (PT)  Kathrynn Ducking    Prior  Therapy  yes three years ago at this facility      Precautions   Precautions  Fall      Restrictions   Weight Bearing Restrictions  No      Balance Screen   Has the patient fallen in the past 6 months  Yes    How many times?  10   "at least 10 times per patient"   Has the patient had a decrease in activity level because of a fear of falling?   Yes    Is the patient reluctant to leave their home because of a fear of falling?   Yes      Breeding  Private residence    Living Arrangements  Alone    Available Help at Discharge  Available PRN/intermittently;Family   nurse M-F 9-4pm, family live right down road, has life alert   Type of [place] Access  Other (comment)   1 step to enter   Home Layout  Multi-level   split level   Alternate Level Stairs-Number of Steps  10 to basement, 6 to first floor    Alternate Level Stairs-Rails  --   left side going down   [place] - 2 wheels;Cane - single point   cane has small rectangular base     Prior Function   Level of Independence  Needs assistance with homemaking;Requires assistive device for independence      Cognition   Overall Cognitive Status  Within Functional Limits for tasks assessed      ROM / Strength   AROM / PROM / Strength  AROM;Strength      AROM   AROM Assessment Site  Hip;Knee;Ankle    Right/Left Hip  Right;Left    Right/Left Knee  Right;Left    Right/Left Ankle  Right;Left      Strength   Strength Assessment Site  Hip;Knee;Ankle    Right/Left Hip  Right;Left    Right/Left Knee  Right;Left    Right/Left Ankle  Right;Left      Transfers   Five time sit to stand comments   58 seconds, with use of B UE       Ambulation/Gait   Ambulation/Gait  Yes    Ambulation/Gait Assistance  6: Modified independent (Device/Increase time);5: Supervision    Ambulation/Gait Assistance Details  BP elevated after walking  and reported pain in  calf muscles after    Ambulation  Distance (Feet)  160 Feet   2 minute walk with cane   Assistive device  Straight cane   cane had larger base than SPC   Gait Pattern  Shuffle;Decreased trunk rotation;Narrow base of support;Trunk flexed;Poor foot clearance - left;Poor foot clearance - right;Decreased arm swing - right;Decreased arm swing - left    Ambulation Surface  Level    Gait velocity  decreased    Gait Comments  BP 196/86 post 2 minute walk test      6 Minute Walk- Baseline   6 Minute Walk- Baseline  --   2 minute walk test taken     Functional Gait  Assessment   Gait assessed   --                Objective measurements completed on examination: See above findings.                PT Short Term Goals - 04/23/19 1253      PT SHORT TERM GOAL #1   Title  Patient will be independent in HEP to improve functional outcomes    Baseline  new goal    Time  3    Period  Weeks    Status  New    Target Date  05/14/19      PT SHORT TERM GOAL #2   Title  Patient will be able to ambulate at least 226 feet with LRAD to improve overall functional endurance.    Baseline  9/15: 160 ft    Time  3    Period  Weeks    Status  New    Target Date  05/14/19        PT Long Term Goals - 04/23/19 1546      PT LONG TERM GOAL #1   Title  Patient will be able to ambulate at least 330 feet with LRAD to improve functional endurance.    Baseline  9/15 160 feet with RW    Time  6    Period  Weeks    Status  New    Target Date  06/04/19      PT LONG TERM GOAL #2   Title  Patient will be able to perform 5x sit to stand in 45 seconds or less to demonstrate improved transitional strength and mobility.    Baseline  9/15 - 58 seconds    Time  6    Period  Weeks    Status  New    Target Date  06/04/19      PT LONG TERM GOAL #3   Title  Patient will report at least 25% improvement in overall walking endurance or balance to demonstrate improved confidence in functional mobility    Baseline  0%    Time  6     Period  Weeks    Status  New    Target Date  06/04/19             Plan - 04/23/19 1131    Personal Factors and Comorbidities  Age;Comorbidity 1;Comorbidity 2    Comorbidities  PD, HTN,    Examination-Activity Limitations  Stand;Sit;Transfers;Squat;Stairs    Examination-Participation Restrictions  Community Activity    Stability/Clinical Decision Making  Evolving/Moderate complexity    Clinical Decision Making  Moderate    Rehab Potential  Fair    PT Frequency  2x / week    PT Duration  6 weeks  PT Treatment/Interventions  ADLs/Self Care Home Management;Cryotherapy;Electrical Stimulation;Moist Heat;Gait training;Stair training;Functional mobility training;Therapeutic activities;Therapeutic exercise;Balance training;Neuromuscular re-education;Patient/family education;Manual techniques;Passive range of motion    PT Next Visit Plan  Initiate HEP, work on functional strength, endurance. -Assess BP after exercise, use manual cuff (automatic one did not read well with resting tremor)    PT Home Exercise Plan  no HEP at this time    Consulted and Agree with Plan of Care  Patient;Family member/caregiver    Family Member Consulted  Edison Nasuti- Grandson       Patient will benefit from skilled therapeutic intervention in order to improve the following deficits and impairments:  Abnormal gait, Decreased activity tolerance, Decreased balance, Decreased coordination, Decreased endurance, Decreased mobility, Decreased range of motion, Decreased strength, Difficulty walking, Pain  Visit Diagnosis: Difficulty in walking, not elsewhere classified  History of falling     Problem List Patient Active Problem List   Diagnosis Date Noted  . Unilateral primary osteoarthritis, right hip 03/21/2019  . Hallucinations 12/26/2018  . Gait abnormality 10/17/2017  . Memory difficulty 10/17/2017  . Urge incontinence 12/19/2013  . 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 Parkinson's disease ([place])  05/28/2013  . Essential hypertension 06/09/2009  . Asthma 06/09/2009  . GERD 06/09/2009  . SYNCOPE 06/09/2009  . Abnormal involuntary movement 06/09/2009    3:52 PM, 04/23/19 Jerene Pitch, DPT Physical Therapy with [facility]  [phone] office   [place] [address] [place], [place], [address] Phone: [phone]   Fax:  [phone]  Name: Heather Gilmore MRN: [id number] Date of Birth: 1930-05-22

## 2019-04-24 DIAGNOSIS — H26491 Other secondary cataract, right eye: Secondary | ICD-10-CM | POA: Diagnosis not present

## 2019-04-25 ENCOUNTER — Other Ambulatory Visit: Payer: Self-pay

## 2019-04-25 ENCOUNTER — Ambulatory Visit (HOSPITAL_COMMUNITY): Payer: PPO

## 2019-04-25 ENCOUNTER — Encounter (HOSPITAL_COMMUNITY): Payer: Self-pay

## 2019-04-25 DIAGNOSIS — Z9181 History of falling: Secondary | ICD-10-CM

## 2019-04-25 DIAGNOSIS — R262 Difficulty in walking, not elsewhere classified: Secondary | ICD-10-CM | POA: Diagnosis not present

## 2019-04-25 NOTE — Therapy (Signed)
[place], [place], [address] Phone: [phone]   Fax:  [phone]  Physical Therapy Treatment  Patient Details  Name: Heather Gilmore MRN: [id number] Date of Birth: 05-Dec-1929 Referring Provider (PT): Kathrynn Ducking   Encounter Date: 04/25/2019  PT End of Session - 04/25/19 1043    Visit Number  2    Number of Visits  12    Date for PT Re-Evaluation  05/14/19    Authorization Type  [employer] - no auth req. no visit limit    Authorization Time Period  04/23/19 to 06/04/19    Authorization - Visit Number  2    Authorization - Number of Visits  10    PT Start Time  1040   pt arrived alte   PT Stop Time  1115    PT Time Calculation (min)  35 min    Activity Tolerance  Patient tolerated treatment well;Patient limited by fatigue    Behavior During Therapy  [facility] for tasks assessed/performed       Past Medical History:  Diagnosis Date  . Arthritis   . Asthma   . Dysphagia   . Essential tremor   . Gait abnormality 10/17/2017  . Hallucinations 12/26/2018  . 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 Hyperlipidemia   . Hypertension   . IBS (irritable bowel syndrome)   . Memory difficulty 10/17/2017  . Parkinson disease ([place])   . Rosacea   . Thyroid nodule     Past Surgical History:  Procedure Laterality Date  . APPENDECTOMY    . CATARACT EXTRACTION W/PHACO  05/03/2011   Procedure: CATARACT EXTRACTION PHACO AND INTRAOCULAR LENS PLACEMENT (IOC);  Surgeon: Elta Guadeloupe T. Gershon Crane;  Location: [facility];  Service: Ophthalmology;  Laterality: Right;  CDE: 7.52  . CATARACT EXTRACTION W/PHACO  05/17/2011   Procedure: CATARACT EXTRACTION PHACO AND INTRAOCULAR LENS PLACEMENT (IOC);  Surgeon: Elta Guadeloupe T. Gershon Crane;  Location: [facility];  Service: Ophthalmology;  Laterality: Left;  CDE 9.49  . CHOLECYSTECTOMY  6 yrs ago   aph-Dr Arnoldo Morale  . ESOPHAGEAL DILATION N/A 03/02/2016   Procedure: ESOPHAGEAL DILATION;  Surgeon: Rogene Houston, MD;  Location: [facility];  Service: Endoscopy;  Laterality: N/A;  . ESOPHAGOGASTRODUODENOSCOPY N/A 03/02/2016   Procedure: ESOPHAGOGASTRODUODENOSCOPY (EGD);  Surgeon: Rogene Houston, MD;  [facility]: [facility];  Service: Endoscopy;  Laterality: N/A;  1:25  . TONSILLECTOMY     as child    There were no vitals filed for this visit.  Subjective Assessment - 04/25/19 1042    Subjective  Pt reports feeling "good as a matter of fact" after evaluation.    Patient is accompained by:  Family member   grandson Edison Nasuti   Pertinent History  Parkingson's Disease, hx of falls    Limitations  Standing;Walking    How long can you sit comfortably?  no difficulty    How long can you stand comfortably?  unsure    How long can you walk comfortably?  one minute max    Patient Stated Goals  to fall less    Currently in Pain?  No/denies    Pain Onset  More than a month ago           [facility] Adult PT Treatment/Exercise - 04/25/19 0001      Exercises   Exercises  Knee/Hip      Knee/Hip Exercises: Standing   Heel Raises  Both;10 reps  Heel Raises Limitations  UE support on counter    Hip Abduction  Both;2 sets;5 reps    Abduction Limitations  UE support on counter      Knee/Hip Exercises: Seated   Long Arc Quad  Both;2 sets;5 reps    Long Arc Quad Limitations  3 sec hold    Other Seated Knee/Hip Exercises  seated X's    Marching  Both;10 reps          PT Education - 04/25/19 1042    Education Details  Reviewed goals, exercise technique, initiated HEP    Person(s) Educated  Patient    Methods  Explanation;Demonstration;Handout    Comprehension  Verbalized understanding;Returned demonstration       PT Short Term Goals - 04/25/19 1043      PT SHORT TERM GOAL #1   Title  Patient will be independent in HEP to improve functional outcomes    Baseline  new goal    Time  3    Period  Weeks    Status  On-going    Target Date  05/14/19      PT SHORT TERM GOAL #2   Title   Patient will be able to ambulate at least 226 feet with LRAD to improve overall functional endurance.    Baseline  9/15: 160 ft    Time  3    Period  Weeks    Status  On-going    Target Date  05/14/19        PT Long Term Goals - 04/25/19 1044      PT LONG TERM GOAL #1   Title  Patient will be able to ambulate at least 330 feet with LRAD to improve functional endurance.    Baseline  9/15 160 feet with RW    Time  6    Period  Weeks    Status  On-going      PT LONG TERM GOAL #2   Title  Patient will be able to perform 5x sit to stand in 45 seconds or less to demonstrate improved transitional strength and mobility.    Baseline  9/15 - 58 seconds    Time  6    Period  Weeks    Status  On-going      PT LONG TERM GOAL #3   Title  Patient will report at least 25% improvement in overall walking endurance or balance to demonstrate improved confidence in functional mobility    Baseline  0%    Time  6    Period  Weeks    Status  On-going            Plan - 04/25/19 1111    Clinical Impression Statement  Began treatment with reviewing pt's goals this date and discussed benefits of skilled PT interventions to improve gait, balance and overall strength to improve ease with functional activities. Pt tolerated seated strengthening exercises with moderate fatigue requiring therapeutic rest breaks to recover. Progressed to standing calf raises and hip abd with multiple cues for form, requiring short sets due to fatiguing. Overall, pt fatigues easily demonstrates decreased endurance to activity requiring cues for breathing and rest breaks. Pt without pain at EOS. Continue to progress as able.    Personal Factors and Comorbidities  Age;Comorbidity 1;Comorbidity 2    Comorbidities  PD, HTN,    Examination-Activity Limitations  Stand;Sit;Transfers;Squat;Stairs    Examination-Participation Restrictions  Community Activity    Stability/Clinical Decision Making  Evolving/Moderate complexity  Rehab Potential  Fair    PT Frequency  2x / week    PT Duration  6 weeks    PT Treatment/Interventions  ADLs/Self Care Home Management;Cryotherapy;Electrical Stimulation;Moist Heat;Gait training;Stair training;Functional mobility training;Therapeutic activities;Therapeutic exercise;Balance training;Neuromuscular re-education;Patient/family education;Manual techniques;Passive range of motion    PT Next Visit Plan  Continue strengthening within pt tolerance, add functional strength exercises, endurance training. Gait training and balance activities. Monitor BP and pt tolerance.    PT Home Exercise Plan  9/17: seated marching, seated LAQ with 3 sec hold    Consulted and Agree with Plan of Care  Patient       Patient will benefit from skilled therapeutic intervention in order to improve the following deficits and impairments:  Abnormal gait, Decreased activity tolerance, Decreased balance, Decreased coordination, Decreased endurance, Decreased mobility, Decreased range of motion, Decreased strength, Difficulty walking, Pain  Visit Diagnosis: Difficulty in walking, not elsewhere classified  History of falling     Problem List Patient Active Problem List   Diagnosis Date Noted  . Unilateral primary osteoarthritis, right hip 03/21/2019  . Hallucinations 12/26/2018  . Gait abnormality 10/17/2017  . Memory difficulty 10/17/2017  . Urge incontinence 12/19/2013  . 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 Parkinson's disease ([place]) 05/28/2013  . Essential hypertension 06/09/2009  . Asthma 06/09/2009  . GERD 06/09/2009  . SYNCOPE 06/09/2009  . Abnormal involuntary movement 06/09/2009    Talbot Grumbling PT, DPT 04/25/19, 11:17 AM [place] [address] [place], [place], [address] Phone: [phone]   Fax:  [phone]  Name: CHANIA EKDAHL MRN: [id number] Date of Birth: [birth year]

## 2019-04-30 ENCOUNTER — Ambulatory Visit (HOSPITAL_COMMUNITY): Payer: PPO | Admitting: Physical Therapy

## 2019-04-30 ENCOUNTER — Other Ambulatory Visit: Payer: Self-pay

## 2019-04-30 DIAGNOSIS — Z9181 History of falling: Secondary | ICD-10-CM

## 2019-04-30 DIAGNOSIS — R262 Difficulty in walking, not elsewhere classified: Secondary | ICD-10-CM

## 2019-04-30 NOTE — Therapy (Signed)
[place], [place], [address] Phone: [phone]   Fax:  [phone]  Physical Therapy Treatment  Patient Details  Name: Heather Gilmore MRN: [id number] Date of Birth: 09-27-1929 Referring Provider (PT): Kathrynn Ducking   Encounter Date: 04/30/2019  PT End of Session - 04/30/19 1324    Visit Number  4    Number of Visits  12    Date for PT Re-Evaluation  05/14/19    Authorization Type  [employer] - no auth req. no visit limit    Authorization Time Period  04/23/19 to 06/04/19    Authorization - Visit Number  4    Authorization - Number of Visits  10    PT Start Time  1000    PT Stop Time  1045    PT Time Calculation (min)  45 min    Activity Tolerance  Patient tolerated treatment well;Patient limited by fatigue    Behavior During Therapy  [facility] for tasks assessed/performed       Past Medical History:  Diagnosis Date  . Arthritis   . Asthma   . Dysphagia   . Essential tremor   . Gait abnormality 10/17/2017  . Hallucinations 12/26/2018  . 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 Hyperlipidemia   . Hypertension   . IBS (irritable bowel syndrome)   . Memory difficulty 10/17/2017  . Parkinson disease ([place])   . Rosacea   . Thyroid nodule     Past Surgical History:  Procedure Laterality Date  . APPENDECTOMY    . CATARACT EXTRACTION W/PHACO  05/03/2011   Procedure: CATARACT EXTRACTION PHACO AND INTRAOCULAR LENS PLACEMENT (IOC);  Surgeon: Elta Guadeloupe T. Gershon Crane;  Location: [facility];  Service: Ophthalmology;  Laterality: Right;  CDE: 7.52  . CATARACT EXTRACTION W/PHACO  05/17/2011   Procedure: CATARACT EXTRACTION PHACO AND INTRAOCULAR LENS PLACEMENT (IOC);  Surgeon: Elta Guadeloupe T. Gershon Crane;  Location: [facility];  Service: Ophthalmology;  Laterality: Left;  CDE 9.49  . CHOLECYSTECTOMY  6 yrs ago   aph-Dr Arnoldo Morale  . ESOPHAGEAL DILATION N/A 03/02/2016   Procedure: ESOPHAGEAL DILATION;  Surgeon: Rogene Houston, MD;   [facility];  Service: Endoscopy;  Laterality: N/A;  . ESOPHAGOGASTRODUODENOSCOPY N/A 03/02/2016   Procedure: ESOPHAGOGASTRODUODENOSCOPY (EGD);  Surgeon: Rogene Houston, MD;  [facility];  Service: Endoscopy;  Laterality: N/A;  1:25  . TONSILLECTOMY     as child    There were no vitals filed for this visit.  Subjective Assessment - 04/30/19 1031    Subjective  pt states she has no pain or issues other than unsteadiness.  Reports compliance with HEP.    Pain Score  0-No pain                       [facility] Adult PT Treatment/Exercise - 04/30/19 0001      Knee/Hip Exercises: Standing   Heel Raises  Both;15 reps    Heel Raises Limitations  UE support, toeraises 15 reps    Hip Flexion  Both;10 reps;Limitations    Hip Flexion Limitations  alternating marching to hip level, hold with 1 UE    Hip Abduction  Both;10 reps    Abduction Limitations  UE support on counter    Hip Extension  Both;10 reps;Knee straight;Limitations    Extension Limitations  cues for form with bil UE assist    Functional Squat  1 set;5 reps;Limitations  Functional Squat Limitations  attempted but unable to complete in good form.    SLS  5" max Rr/Lt without UE assist    Other Standing Knee Exercises  tandem stance 10" max each without UE assist      Knee/Hip Exercises: Seated   Long Arc Quad  Both;10 reps;Limitations    Long Arc Quad Limitations  3 sec hold    Sit to [employer]  5 reps;without UE support               PT Short Term Goals - 04/25/19 1043      PT SHORT TERM GOAL #1   Title  Patient will be independent in HEP to improve functional outcomes    Baseline  new goal    Time  3    Period  Weeks    Status  On-going    Target Date  05/14/19      PT SHORT TERM GOAL #2   Title  Patient will be able to ambulate at least 226 feet with LRAD to improve overall functional endurance.    Baseline  9/15: 160 ft    Time  3    Period  Weeks    Status  On-going     Target Date  05/14/19        PT Long Term Goals - 04/25/19 1044      PT LONG TERM GOAL #1   Title  Patient will be able to ambulate at least 330 feet with LRAD to improve functional endurance.    Baseline  9/15 160 feet with RW    Time  6    Period  Weeks    Status  On-going      PT LONG TERM GOAL #2   Title  Patient will be able to perform 5x sit to stand in 45 seconds or less to demonstrate improved transitional strength and mobility.    Baseline  9/15 - 58 seconds    Time  6    Period  Weeks    Status  On-going      PT LONG TERM GOAL #3   Title  Patient will report at least 25% improvement in overall walking endurance or balance to demonstrate improved confidence in functional mobility    Baseline  0%    Time  6    Period  Weeks    Status  On-going            Plan - 04/30/19 1325    Clinical Impression Statement  Pt unsteady when stood in waiting room but able to self correct.  Cues to take larger steps, slow down.  continued with standing exercises adding hip extension, SLS, vectors, marching and tandem gait.  Pt able to complete all actvities with multiple rest breaks this session.  Pt with most diffiuclty maintaining SLS and unable to complete squats in correct form.  Worked on sit to stands instead with pt able to possess good control both ascending and descending.  BP taken during session today 129/76 mmHg and HR 59 bpm.    Personal Factors and Comorbidities  Age;Comorbidity 1;Comorbidity 2    Comorbidities  PD, HTN,    Examination-Activity Limitations  Stand;Sit;Transfers;Squat;Stairs    Examination-Participation Restrictions  Community Activity    Stability/Clinical Decision Making  Evolving/Moderate complexity    Rehab Potential  Fair    PT Frequency  2x / week    PT Duration  6 weeks    PT Treatment/Interventions  ADLs/Self Care  Home Management;Cryotherapy;Electrical Stimulation;Moist Heat;Gait training;Stair training;Functional mobility training;Therapeutic  activities;Therapeutic exercise;Balance training;Neuromuscular re-education;Patient/family education;Manual techniques;Passive range of motion    PT Next Visit Plan  continue with static balance actvities and strengthening.  Work to improve Designer, multimedia and quality.  MOnitor BP as needed.  Pt will need breaks during session.    PT Home Exercise Plan  9/17: seated marching, seated LAQ with 3 sec hold    Consulted and Agree with Plan of Care  Patient       Patient will benefit from skilled therapeutic intervention in order to improve the following deficits and impairments:  Abnormal gait, Decreased activity tolerance, Decreased balance, Decreased coordination, Decreased endurance, Decreased mobility, Decreased range of motion, Decreased strength, Difficulty walking, Pain  Visit Diagnosis: Difficulty in walking, not elsewhere classified  History of falling     Problem List Patient Active Problem List   Diagnosis Date Noted  . Unilateral primary osteoarthritis, right hip 03/21/2019  . Hallucinations 12/26/2018  . Gait abnormality 10/17/2017  . Memory difficulty 10/17/2017  . Urge incontinence 12/19/2013  . 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 Parkinson's disease ([place]) 05/28/2013  . Essential hypertension 06/09/2009  . Asthma 06/09/2009  . GERD 06/09/2009  . SYNCOPE 06/09/2009  . Abnormal involuntary movement 06/09/2009   Teena Irani, PTA/CLT [phone]  Teena Irani 04/30/2019, 1:29 PM  [place] [address] [place], [place], [address] Phone: [phone]   Fax:  [phone]  Name: ATHLEEN HUGUNIN MRN: [id number] Date of Birth: 29-Sep-1929

## 2019-05-02 ENCOUNTER — Other Ambulatory Visit: Payer: Self-pay

## 2019-05-02 ENCOUNTER — Ambulatory Visit (HOSPITAL_COMMUNITY): Payer: PPO | Admitting: Physical Therapy

## 2019-05-02 DIAGNOSIS — Z9181 History of falling: Secondary | ICD-10-CM

## 2019-05-02 DIAGNOSIS — R262 Difficulty in walking, not elsewhere classified: Secondary | ICD-10-CM

## 2019-05-02 NOTE — Therapy (Signed)
[place], [place], [address] Phone: [phone]   Fax:  [phone]  Physical Therapy Treatment  Patient Details  Name: Heather Gilmore MRN: [id number] Date of Birth: 06-Feb-1930 Referring Provider (PT): Kathrynn Ducking   Encounter Date: 05/02/2019  PT End of Session - 05/02/19 1029    Visit Number  5    Number of Visits  12    Date for PT Re-Evaluation  05/14/19    Authorization Type  [employer] - no auth req. no visit limit    Authorization Time Period  04/23/19 to 06/04/19    Authorization - Visit Number  5    Authorization - Number of Visits  10    PT Start Time  1004    PT Stop Time  1045    PT Time Calculation (min)  41 min    Activity Tolerance  Patient tolerated treatment well;Patient limited by fatigue    Behavior During Therapy  [facility] for tasks assessed/performed       Past Medical History:  Diagnosis Date  . Arthritis   . Asthma   . Dysphagia   . Essential tremor   . Gait abnormality 10/17/2017  . Hallucinations 12/26/2018  . 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 Hyperlipidemia   . Hypertension   . IBS (irritable bowel syndrome)   . Memory difficulty 10/17/2017  . Parkinson disease ([place])   . Rosacea   . Thyroid nodule     Past Surgical History:  Procedure Laterality Date  . APPENDECTOMY    . CATARACT EXTRACTION W/PHACO  05/03/2011   Procedure: CATARACT EXTRACTION PHACO AND INTRAOCULAR LENS PLACEMENT (IOC);  Surgeon: Elta Guadeloupe T. Gershon Crane;  [facility];  Service: Ophthalmology;  Laterality: Right;  CDE: 7.52  . CATARACT EXTRACTION W/PHACO  05/17/2011   Procedure: CATARACT EXTRACTION PHACO AND INTRAOCULAR LENS PLACEMENT (IOC);  Surgeon: Elta Guadeloupe T. Gershon Crane;  Location: [facility];  Service: Ophthalmology;  Laterality: Left;  CDE 9.49  . CHOLECYSTECTOMY  6 yrs ago   aph-Dr Arnoldo Morale  . ESOPHAGEAL DILATION N/A 03/02/2016   Procedure: ESOPHAGEAL DILATION;  Surgeon: Rogene Houston, MD;   [facility];  Service: Endoscopy;  Laterality: N/A;  . ESOPHAGOGASTRODUODENOSCOPY N/A 03/02/2016   Procedure: ESOPHAGOGASTRODUODENOSCOPY (EGD);  Surgeon: Rogene Houston, MD;  [facility];  Service: Endoscopy;  Laterality: N/A;  1:25  . TONSILLECTOMY     as child    There were no vitals filed for this visit.  Subjective Assessment - 05/02/19 1021    Subjective  pt states she is sore across her hips and shoulders from the new exercises added last session.  No pain though    Currently in Pain?  No/denies                       [facility] Adult PT Treatment/Exercise - 05/02/19 0001      Knee/Hip Exercises: Standing   Heel Raises  Both;15 reps    Heel Raises Limitations  UE support, toeraises 15 reps    Hip Flexion  Both;10 reps;Limitations    Hip Flexion Limitations  alternating marching to hip level, hold with 1 UE    Hip Abduction  Both;10 reps    Abduction Limitations  UE support on counter    Hip Extension  Both;10 reps;Knee straight;Limitations    Extension Limitations  cues for form with bil UE assist    SLS  5" max Rr/Lt without UE assist    Other Standing Knee Exercises  tandem stance 10" max each without UE assist      Knee/Hip Exercises: Seated   Long Arc Quad  Both;Limitations;15 reps    Long Arc Quad Limitations  3 sec hold    Sit to [employer]  5 reps;without UE support;2 sets               PT Short Term Goals - 04/25/19 1043      PT SHORT TERM GOAL #1   Title  Patient will be independent in HEP to improve functional outcomes    Baseline  new goal    Time  3    Period  Weeks    Status  On-going    Target Date  05/14/19      PT SHORT TERM GOAL #2   Title  Patient will be able to ambulate at least 226 feet with LRAD to improve overall functional endurance.    Baseline  9/15: 160 ft    Time  3    Period  Weeks    Status  On-going    Target Date  05/14/19        PT Long Term Goals - 04/25/19 1044      PT LONG TERM GOAL  #1   Title  Patient will be able to ambulate at least 330 feet with LRAD to improve functional endurance.    Baseline  9/15 160 feet with RW    Time  6    Period  Weeks    Status  On-going      PT LONG TERM GOAL #2   Title  Patient will be able to perform 5x sit to stand in 45 seconds or less to demonstrate improved transitional strength and mobility.    Baseline  9/15 - 58 seconds    Time  6    Period  Weeks    Status  On-going      PT LONG TERM GOAL #3   Title  Patient will report at least 25% improvement in overall walking endurance or balance to demonstrate improved confidence in functional mobility    Baseline  0%    Time  6    Period  Weeks    Status  On-going            Plan - 05/02/19 1030    Clinical Impression Statement  continued with established therex.  No new exercises added this session due to increased soreness, however did add reps to some exercises.  Pt required cues for hold times and forms with some exercises as well.  pt required less breaks and no dizziness reported or LOB observed.    Personal Factors and Comorbidities  Age;Comorbidity 1;Comorbidity 2    Comorbidities  PD, HTN,    Examination-Activity Limitations  Stand;Sit;Transfers;Squat;Stairs    Examination-Participation Restrictions  Community Activity    Stability/Clinical Decision Making  Evolving/Moderate complexity    Rehab Potential  Fair    PT Frequency  2x / week    PT Duration  6 weeks    PT Treatment/Interventions  ADLs/Self Care Home Management;Cryotherapy;Electrical Stimulation;Moist Heat;Gait training;Stair training;Functional mobility training;Therapeutic activities;Therapeutic exercise;Balance training;Neuromuscular re-education;Patient/family education;Manual techniques;Passive range of motion    PT Next Visit Plan  continue with static balance actvities and strengthening.  Work to improve Designer, multimedia and quality.  MOnitor BP as needed.  Pt will need breaks during session.  next  session add lunges and step  ups.  Progress balance to foam surface and with UE challenges as progresses.    PT Home Exercise Plan  9/17: seated marching, seated LAQ with 3 sec hold    Consulted and Agree with Plan of Care  Patient       Patient will benefit from skilled therapeutic intervention in order to improve the following deficits and impairments:  Abnormal gait, Decreased activity tolerance, Decreased balance, Decreased coordination, Decreased endurance, Decreased mobility, Decreased range of motion, Decreased strength, Difficulty walking, Pain  Visit Diagnosis: Difficulty in walking, not elsewhere classified  History of falling     Problem List Patient Active Problem List   Diagnosis Date Noted  . Unilateral primary osteoarthritis, right hip 03/21/2019  . Hallucinations 12/26/2018  . Gait abnormality 10/17/2017  . Memory difficulty 10/17/2017  . Urge incontinence 12/19/2013  . 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 Parkinson's disease ([place]) 05/28/2013  . Essential hypertension 06/09/2009  . Asthma 06/09/2009  . GERD 06/09/2009  . SYNCOPE 06/09/2009  . Abnormal involuntary movement 06/09/2009   Teena Irani, PTA/CLT [phone]  Teena Irani 05/02/2019, 11:12 AM  [place] [address] [place], [place], [address] Phone: [phone]   Fax:  [phone]  Name: RONIYAH BRESEE MRN: [id number] Date of Birth: 02-07-1930

## 2019-05-07 ENCOUNTER — Ambulatory Visit (HOSPITAL_COMMUNITY): Payer: PPO

## 2019-05-07 ENCOUNTER — Encounter (HOSPITAL_COMMUNITY): Payer: Self-pay

## 2019-05-07 ENCOUNTER — Other Ambulatory Visit: Payer: Self-pay

## 2019-05-07 VITALS — BP 137/63 | HR 59

## 2019-05-07 DIAGNOSIS — R262 Difficulty in walking, not elsewhere classified: Secondary | ICD-10-CM | POA: Diagnosis not present

## 2019-05-07 NOTE — Therapy (Signed)
[place], [place], [address] Phone: [phone]   Fax:  [phone]  Physical Therapy Treatment  Patient Details  Name: Heather Gilmore MRN: [id number] Date of Birth: 24-Feb-1930 Referring Provider (PT): Kathrynn Ducking   Encounter Date: 05/07/2019  PT End of Session - 05/07/19 1143    Visit Number  6    Number of Visits  12    Date for PT Re-Evaluation  05/14/19    Authorization Type  [employer] - no auth req. no visit limit    Authorization Time Period  04/23/19 to 06/04/19    Authorization - Visit Number  6    Authorization - Number of Visits  10    PT Start Time  [id number]    PT Stop Time  1224    PT Time Calculation (min)  45 min    Activity Tolerance  Patient tolerated treatment well;Patient limited by fatigue    Behavior During Therapy  [facility] for tasks assessed/performed       Past Medical History:  Diagnosis Date  . Arthritis   . Asthma   . Dysphagia   . Essential tremor   . Gait abnormality 10/17/2017  . Hallucinations 12/26/2018  . 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 Hyperlipidemia   . Hypertension   . IBS (irritable bowel syndrome)   . Memory difficulty 10/17/2017  . Parkinson disease ([place])   . Rosacea   . Thyroid nodule     Past Surgical History:  Procedure Laterality Date  . APPENDECTOMY    . CATARACT EXTRACTION W/PHACO  05/03/2011   Procedure: CATARACT EXTRACTION PHACO AND INTRAOCULAR LENS PLACEMENT (IOC);  Surgeon: Elta Guadeloupe T. Gershon Crane;  Location: [facility];  Service: Ophthalmology;  Laterality: Right;  CDE: 7.52  . CATARACT EXTRACTION W/PHACO  05/17/2011   Procedure: CATARACT EXTRACTION PHACO AND INTRAOCULAR LENS PLACEMENT (IOC);  Surgeon: Elta Guadeloupe T. Gershon Crane;  Location: [facility];  Service: Ophthalmology;  Laterality: Left;  CDE 9.49  . CHOLECYSTECTOMY  6 yrs ago   aph-Dr Arnoldo Morale  . ESOPHAGEAL DILATION N/A 03/02/2016   Procedure: ESOPHAGEAL DILATION;  Surgeon: Rogene Houston, MD;   [facility];  Service: Endoscopy;  Laterality: N/A;  . ESOPHAGOGASTRODUODENOSCOPY N/A 03/02/2016   Procedure: ESOPHAGOGASTRODUODENOSCOPY (EGD);  Surgeon: Rogene Houston, MD;  [facility];  Service: Endoscopy;  Laterality: N/A;  1:25  . TONSILLECTOMY     as child    Vitals:   05/07/19 1159  BP: 137/63  Pulse: (!) 59    Subjective Assessment - 05/07/19 1142    Subjective  Pt stated she is feeling good today, stated she was sore following exercises last session.  No reports of pain today.    Currently in Pain?  No/denies                       [facility] Adult PT Treatment/Exercise - 05/07/19 0001      Exercises   Exercises  Knee/Hip      Knee/Hip Exercises: Standing   Heel Raises  Both;15 reps    Heel Raises Limitations  UE support, toeraises 15 reps    Hip Flexion  Both;10 reps;Limitations    Hip Flexion Limitations  alternating marching to hip level, hold with 1 UE    Forward Step Up  Both;10 reps;Hand Hold: 2;Step Height: 6"    Step Down  Both;10 reps;Hand Hold: 2;Step Height: 6"  Other Standing Knee Exercises  tandem stance on foam intermittent HHA    Other Standing Knee Exercises  sidestep 1RT down blue line, constant cueing for posture      Knee/Hip Exercises: Seated   Other Seated Knee/Hip Exercises  Sitting tall x 2 min    Sit to Sand  5 reps;without UE support;2 sets               PT Short Term Goals - 04/25/19 1043      PT SHORT TERM GOAL #1   Title  Patient will be independent in HEP to improve functional outcomes    Baseline  new goal    Time  3    Period  Weeks    Status  On-going    Target Date  05/14/19      PT SHORT TERM GOAL #2   Title  Patient will be able to ambulate at least 226 feet with LRAD to improve overall functional endurance.    Baseline  9/15: 160 ft    Time  3    Period  Weeks    Status  On-going    Target Date  05/14/19        PT Long Term Goals - 04/25/19 1044      PT LONG TERM GOAL  #1   Title  Patient will be able to ambulate at least 330 feet with LRAD to improve functional endurance.    Baseline  9/15 160 feet with RW    Time  6    Period  Weeks    Status  On-going      PT LONG TERM GOAL #2   Title  Patient will be able to perform 5x sit to stand in 45 seconds or less to demonstrate improved transitional strength and mobility.    Baseline  9/15 - 58 seconds    Time  6    Period  Weeks    Status  On-going      PT LONG TERM GOAL #3   Title  Patient will report at least 25% improvement in overall walking endurance or balance to demonstrate improved confidence in functional mobility    Baseline  0%    Time  6    Period  Weeks    Status  On-going            Plan - 05/07/19 1246    Clinical Impression Statement  Continued with established POC for functional strengthening and balance training.  Pt continues to fatigue easily requiring multiple seated rest breaks.  Did c/o swimmy headed, vitals assessed with BP at 137/63 mmHg.  Was able to progress funcitonal strengthening wiht additional step training.  Also progressed balance training wiht dynamic surfaces, pt c/o arthritic pain in Rt hip on dynamic surface, no pain following exercise.  No reports of pain at EOS,    Personal Factors and Comorbidities  Age;Comorbidity 1;Comorbidity 2    Comorbidities  PD, HTN,    Examination-Activity Limitations  Stand;Sit;Transfers;Squat;Stairs    Examination-Participation Restrictions  Community Activity    Stability/Clinical Decision Making  Evolving/Moderate complexity    Clinical Decision Making  Moderate    Rehab Potential  Fair    PT Frequency  2x / week    PT Duration  6 weeks    PT Treatment/Interventions  ADLs/Self Care Home Management;Cryotherapy;Electrical Stimulation;Moist Heat;Gait training;Stair training;Functional mobility training;Therapeutic activities;Therapeutic exercise;Balance training;Neuromuscular re-education;Patient/family education;Manual  techniques;Passive range of motion    PT Next Visit Plan  continue with  static balance actvities and strengthening.  Work to improve Designer, multimedia and quality.  MOnitor BP as needed.  Pt will need breaks during session.  next session add lunges and continue step ups.  Progress balance to foam surface and with UE challenges as progresses.    PT Home Exercise Plan  9/17: seated marching, seated LAQ with 3 sec hold       Patient will benefit from skilled therapeutic intervention in order to improve the following deficits and impairments:  Abnormal gait, Decreased activity tolerance, Decreased balance, Decreased coordination, Decreased endurance, Decreased mobility, Decreased range of motion, Decreased strength, Difficulty walking, Pain  Visit Diagnosis: Difficulty in walking, not elsewhere classified     Problem List Patient Active Problem List   Diagnosis Date Noted  . Unilateral primary osteoarthritis, right hip 03/21/2019  . Hallucinations 12/26/2018  . Gait abnormality 10/17/2017  . Memory difficulty 10/17/2017  . Urge incontinence 12/19/2013  . 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 Parkinson's disease ([place]) 05/28/2013  . Essential hypertension 06/09/2009  . Asthma 06/09/2009  . GERD 06/09/2009  . SYNCOPE 06/09/2009  . Abnormal involuntary movement 06/09/2009   Ihor Austin, LPTA; [place]  Aldona Lento 05/07/2019, 12:51 PM  [place] [address] [place], [place], [address] Phone: [phone]   Fax:  [phone]  Name: Heather Gilmore MRN: [id number] Date of Birth: 1930/06/11

## 2019-05-08 DIAGNOSIS — H26492 Other secondary cataract, left eye: Secondary | ICD-10-CM | POA: Diagnosis not present

## 2019-05-09 ENCOUNTER — Ambulatory Visit (HOSPITAL_COMMUNITY): Payer: PPO | Attending: Neurology | Admitting: Physical Therapy

## 2019-05-09 ENCOUNTER — Other Ambulatory Visit: Payer: Self-pay

## 2019-05-09 ENCOUNTER — Encounter (HOSPITAL_COMMUNITY): Payer: Self-pay | Admitting: Physical Therapy

## 2019-05-09 DIAGNOSIS — R262 Difficulty in walking, not elsewhere classified: Secondary | ICD-10-CM | POA: Diagnosis not present

## 2019-05-09 DIAGNOSIS — Z9181 History of falling: Secondary | ICD-10-CM | POA: Diagnosis not present

## 2019-05-09 NOTE — Therapy (Addendum)
[facility] [address] [place], [place], [address] Phone: [phone]   Fax:  [phone]  Physical Therapy Treatment and progress note and Discharge Note  Patient Details  Name: Heather Gilmore MRN: [id number] Date of Birth: 04-16-1930 Referring Provider (PT): [place] THERAPY DISCHARGE SUMMARY  Visits from Start of Care: 7  Current functional level related to goals / functional outcomes: See below   Remaining deficits: See below   Education / Equipment: See below  Plan: Patient agrees to discharge.  Patient goals were partially met. Patient is being discharged due to not returning since the last visit.  ?????    10:15 AM, 05/06/20 Jerene Pitch, DPT Physical Therapy with [facility]  [phone] office    Progress Note Reporting Period 04/23/19 to 05/09/19  See note below for Objective Data and Assessment of Progress/Goals.       Encounter Date: 05/09/2019  PT End of Session - 05/09/19 1304    Visit Number  7    Number of Visits  12    Date for PT Re-Evaluation  05/14/19    Authorization Type  [employer] - no auth req. no visit limit    Authorization Time Period  04/23/19 to 06/04/19    Authorization - Visit Number  1    Authorization - Number of Visits  10    PT Start Time  1139   patient late to session   PT Stop Time  1213    PT Time Calculation (min)  34 min    Activity Tolerance  Patient tolerated treatment well;Patient limited by fatigue    Behavior During Therapy  [facility] for tasks assessed/performed       Past Medical History:  Diagnosis Date  . Arthritis   . Asthma   . Dysphagia   . Essential tremor   . Gait abnormality 10/17/2017  . Hallucinations 12/26/2018  . 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 Hyperlipidemia   . Hypertension   . IBS (irritable bowel syndrome)   . Memory difficulty 10/17/2017  . Parkinson disease ([place])   . Rosacea    . Thyroid nodule     Past Surgical History:  Procedure Laterality Date  . APPENDECTOMY    . CATARACT EXTRACTION W/PHACO  05/03/2011   Procedure: CATARACT EXTRACTION PHACO AND INTRAOCULAR LENS PLACEMENT (IOC);  Surgeon: Elta Guadeloupe T. Gershon Crane;  Location: [facility];  Service: Ophthalmology;  Laterality: Right;  CDE: 7.52  . CATARACT EXTRACTION W/PHACO  05/17/2011   Procedure: CATARACT EXTRACTION PHACO AND INTRAOCULAR LENS PLACEMENT (IOC);  Surgeon: Elta Guadeloupe T. Gershon Crane;  Location: [facility];  Service: Ophthalmology;  Laterality: Left;  CDE 9.49  . CHOLECYSTECTOMY  6 yrs ago   aph-Dr Arnoldo Morale  . ESOPHAGEAL DILATION N/A 03/02/2016   Procedure: ESOPHAGEAL DILATION;  Surgeon: Rogene Houston, MD;  [facility];  Service: Endoscopy;  Laterality: N/A;  . ESOPHAGOGASTRODUODENOSCOPY N/A 03/02/2016   Procedure: ESOPHAGOGASTRODUODENOSCOPY (EGD);  Surgeon: Rogene Houston, MD;  [facility];  Service: Endoscopy;  Laterality: N/A;  1:25  . TONSILLECTOMY     as child    There were no vitals filed for this visit.  Subjective Assessment - 05/09/19 1136    Subjective  got a flu shot yesterday and she is sore in her arm. States she is sore all over from PT. States it has not let up since she has started. States she has too many  appointments to go to and wants to be done with PT next week. States she doesn't see a lot of change since starting PT. Agrees to reassessment on this date    Pain Score  0-No pain         [facility] PT Assessment - 05/09/19 0001      Ambulation/Gait   Ambulation/Gait  Yes    Ambulation/Gait Assistance  6: Modified independent (Device/Increase time)    Ambulation/Gait Assistance Details  increased BP noted after walking and pain in thighs during/after walking     Ambulation Distance (Feet)  226 Feet    Assistive device  Straight cane    Gait Pattern  Shuffle;Decreased trunk rotation;Narrow base of support;Trunk flexed;Poor foot clearance - left;Poor foot clearance -  right;Decreased arm swing - right;Decreased arm swing - left    Ambulation Surface  Level    Gait velocity  decreased    Gait Comments  one seated rest break during, took 3 min 32 sec. 178/72 post walking (was 158/72 at beginning of session)      Standardized Balance Assessment   Standardized Balance Assessment  Five Times Sit to Stand    Five times sit to stand comments   27.1 sec with UE assist                   [facility] Adult PT Treatment/Exercise - 05/09/19 0001      Knee/Hip Exercises: Standing   Other Standing Knee Exercises  foot taps on 4" steps with 1 UE assist 2x10, B             PT Education - 05/09/19 1303    Education Details  educated patietn on progress, anticipated progress to make and benefits of continued progress. Reviewed goals.    Person(s) Educated  Patient    Methods  Explanation    Comprehension  Verbalized understanding       PT Short Term Goals - 05/09/19 1214      PT SHORT TERM GOAL #1   Title  Patient will be independent in HEP to improve functional outcomes    Baseline  not met 05/09/19    Time  3    Period  Weeks    Status  On-going    Target Date  05/14/19      PT SHORT TERM GOAL #2   Title  Patient will be able to ambulate at least 226 feet with LRAD to improve overall functional endurance.    Baseline  05/09/19 226 with SPC    Time  3    Period  Weeks    Status  Achieved    Target Date  05/14/19        PT Long Term Goals - 05/09/19 1204      PT LONG TERM GOAL #1   Title  Patient will be able to ambulate at least 330 feet with LRAD to improve functional endurance.    Baseline  05/09/19 226 with SPC    Time  6    Period  Weeks    Status  On-going      PT LONG TERM GOAL #2   Title  Patient will be able to perform 5x sit to stand in 45 seconds or less to demonstrate improved transitional strength and mobility.    Baseline  05/09/19 27.1 seconds    Time  6    Period  Weeks    Status  Achieved      PT LONG TERM GOAL  #  3   Title  Patient will report at least 25% improvement in overall walking endurance or balance to demonstrate improved confidence in functional mobility    Baseline  05/09/19 0%    Time  6    Period  Weeks    Status  On-going            Plan - 05/09/19 1301    Clinical Impression Statement  Reassessment performed on this date. Patient is progressing in overall function and tolerance to physical activity. Patient has achieved 1 out of 2 short term goals and 1 out of 3 long term goals. Patient's blood pressure also did not elevate as high after walking  compared to on her evaluation. Discussed progress with patient and patient is ok with continuing therapy until next week, but is undecided at continuing past that as she does have a lot of appointments to go to and it is tiring. Patient would continue to benefit from skilled physical therapy focusing on improving functional mobility and endurance.    Personal Factors and Comorbidities  Age;Comorbidity 1;Comorbidity 2    Comorbidities  PD, HTN,    Examination-Activity Limitations  Stand;Sit;Transfers;Squat;Stairs    Examination-Participation Restrictions  Community Activity    Stability/Clinical Decision Making  Evolving/Moderate complexity    Rehab Potential  Fair    PT Frequency  2x / week    PT Duration  6 weeks    PT Treatment/Interventions  ADLs/Self Care Home Management;Cryotherapy;Electrical Stimulation;Moist Heat;Gait training;Stair training;Functional mobility training;Therapeutic activities;Therapeutic exercise;Balance training;Neuromuscular re-education;Patient/family education;Manual techniques;Passive range of motion    PT Next Visit Plan  continue with static balance actvities and strengthening.  Work to improve Designer, multimedia and quality.  Monitor BP.  Pt will need breaks during session.  next session add lunges and continue step ups.  Progress balance to foam surface and with UE challenges as progresses.    PT Home Exercise Plan   9/17: seated marching, seated LAQ with 3 sec hold       Patient will benefit from skilled therapeutic intervention in order to improve the following deficits and impairments:  Abnormal gait, Decreased activity tolerance, Decreased balance, Decreased coordination, Decreased endurance, Decreased mobility, Decreased range of motion, Decreased strength, Difficulty walking, Pain  Visit Diagnosis: Difficulty in walking, not elsewhere classified  History of falling     Problem List Patient Active Problem List   Diagnosis Date Noted  . Unilateral primary osteoarthritis, right hip 03/21/2019  . Hallucinations 12/26/2018  . Gait abnormality 10/17/2017  . Memory difficulty 10/17/2017  . Urge incontinence 12/19/2013  . 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 Parkinson's disease ([place]) 05/28/2013  . Essential hypertension 06/09/2009  . Asthma 06/09/2009  . GERD 06/09/2009  . SYNCOPE 06/09/2009  . Abnormal involuntary movement 06/09/2009    1:07 PM, 05/09/19 Jerene Pitch, DPT Physical Therapy with [facility]  [phone] office   [place] [address] [place], [place], [address] Phone: [phone]   Fax:  [phone]  Name: Heather Gilmore MRN: [id number] Date of Birth: 03-04-1930

## 2019-05-14 ENCOUNTER — Ambulatory Visit (HOSPITAL_COMMUNITY): Payer: PPO | Admitting: Physical Therapy

## 2019-05-14 ENCOUNTER — Telehealth (HOSPITAL_COMMUNITY): Payer: Self-pay | Admitting: Family Medicine

## 2019-05-14 NOTE — Telephone Encounter (Signed)
05/14/19  caller said that patient fell and just isn't feeling well and needs to cx today

## 2019-05-15 ENCOUNTER — Telehealth (HOSPITAL_COMMUNITY): Payer: Self-pay | Admitting: Physical Therapy

## 2019-05-15 NOTE — Telephone Encounter (Signed)
Pt said that patient fell and just isn't feeling well and needs to cx today-Pt will call Monday 10/12 and let us know how she is

## 2019-05-16 ENCOUNTER — Ambulatory Visit (HOSPITAL_COMMUNITY): Payer: PPO | Admitting: Physical Therapy

## 2019-05-20 ENCOUNTER — Telehealth (HOSPITAL_COMMUNITY): Payer: Self-pay | Admitting: Physical Therapy

## 2019-05-20 ENCOUNTER — Telehealth (HOSPITAL_COMMUNITY): Payer: Self-pay

## 2019-05-20 NOTE — Telephone Encounter (Signed)
pt wanted to cancel all of the remaining appts due to she says she is not up to doing therapy anymore.

## 2019-05-21 ENCOUNTER — Ambulatory Visit (HOSPITAL_COMMUNITY): Payer: PPO | Admitting: Physical Therapy

## 2019-05-23 ENCOUNTER — Ambulatory Visit (HOSPITAL_COMMUNITY): Payer: PPO

## 2019-05-28 ENCOUNTER — Ambulatory Visit (HOSPITAL_COMMUNITY): Payer: PPO | Admitting: Physical Therapy

## 2019-05-30 ENCOUNTER — Ambulatory Visit (HOSPITAL_COMMUNITY): Payer: PPO | Admitting: Physical Therapy

## 2019-05-31 NOTE — Progress Notes (Signed)
Subjective:    Patient ID: Heather Gilmore, female    DOB: 09/18/1929, 85 y.o.   MRN: [id number]  HPI  Heather Gilmore is an 85 year old female with a past medical history of asthma, depression, hypertension, arthritis, Parkinson's Disease diagnosed 12 to 14 years ago, IBS and dysphagia. She presents today with complaints of dysphagia. She ports having 2 episodes over the past month when food became stuck to the upper esophagus, she coughed or gagged and the food was expelled.  She denies having any heartburn or stomach pain.  She is not taking a PPI or other acid reducing medication.  Her most recent EGD was 03/02/2016 which identified a benign-appearing esophageal stenosis which was dilated, a 4 cm hiatal hernia was also noted.  Biopsies were negative for eosinophilic esophagitis.  He has occasional constipation.  She is passing small darker brown bowel movements daily.  She stated her bowel movements have been a darker brown since she started her Parkinson's medication more than 10 years ago.  No frank melena.  No rectal bleeding.  No fever sweats or chills.  Her weight has been stable.  She is having recurrence of syncope, her last episode was approximately 2 or 3 weeks ago.  He is followed by neurologist, Dr. Jannifer [place], regarding her Parkinson's disease.  She denies seeing a cardiologist.  It is unclear when she had a colonoscopy. Her son [name] is present.  CBC    Component Value Date/Time   WBC 7.0 09/11/2018 0953   WBC 6.0 03/17/2017 1026   RBC 4.72 09/11/2018 0953   RBC 4.55 03/17/2017 1026   HGB 13.4 09/11/2018 0953   HCT 41.8 09/11/2018 0953   PLT 305 09/11/2018 0953   MCV 89 09/11/2018 0953   MCH 28.4 09/11/2018 0953   MCH 29.5 03/17/2017 1026   MCHC 32.1 09/11/2018 0953   MCHC 33.8 03/17/2017 1026   RDW 12.9 09/11/2018 0953   LYMPHSABS 1.8 09/11/2018 0953   MONOABS 0.4 03/17/2017 1026   EOSABS 0.1 09/11/2018 0953   BASOSABS 0.0 09/11/2018 0953   EGD 03/02/2016: - Granular,  longitudinally marked, white specked mucosa in the esophagus.  -Biopsies negative for eosinophilic esophagitis. - Benign-appearing esophageal stenosis. Dilated. - 4 cm hiatal hernia. - Normal stomach. - Normal duodenal bulb and second portion of the duodenum  Past Medical History:  Diagnosis Date  . Arthritis   . Asthma   . Dysphagia   . Essential tremor   . Gait abnormality 10/17/2017  . Hallucinations 12/26/2018  . 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 Hyperlipidemia   . Hypertension   . IBS (irritable bowel syndrome)   . Memory difficulty 10/17/2017  . Parkinson disease ([place])   . Rosacea   . Thyroid nodule    Past Surgical History:  Procedure Laterality Date  . APPENDECTOMY    . CATARACT EXTRACTION W/PHACO  05/03/2011   Procedure: CATARACT EXTRACTION PHACO AND INTRAOCULAR LENS PLACEMENT (IOC);  Surgeon: Elta Guadeloupe T. Gershon Crane;  Location: [facility];  Service: Ophthalmology;  Laterality: Right;  CDE: 7.52  . CATARACT EXTRACTION W/PHACO  05/17/2011   Procedure: CATARACT EXTRACTION PHACO AND INTRAOCULAR LENS PLACEMENT (IOC);  Surgeon: Elta Guadeloupe T. Gershon Crane;  Location: [facility];  Service: Ophthalmology;  Laterality: Left;  CDE 9.49  . CHOLECYSTECTOMY  6 yrs ago   aph-Dr Arnoldo Morale  . ESOPHAGEAL DILATION N/A 03/02/2016   Procedure: ESOPHAGEAL DILATION;  Surgeon: Rogene Houston, MD;  [facility];  Service:  Endoscopy;  Laterality: N/A;  . ESOPHAGOGASTRODUODENOSCOPY N/A 03/02/2016   Procedure: ESOPHAGOGASTRODUODENOSCOPY (EGD);  Surgeon: Rogene Houston, MD;  [facility];  Service: Endoscopy;  Laterality: N/A;  1:25  . TONSILLECTOMY     as child   Current Outpatient Medications on File Prior to Visit  Medication Sig Dispense Refill  . cetirizine (ZYRTEC) 10 MG tablet Take 10 mg by mouth daily.    Marland Kitchen acetaminophen (TYLENOL) 500 MG tablet Take 500 mg by mouth every 6 (six) hours as needed. For pain     . albuterol (PROVENTIL HFA;VENTOLIN HFA) 108 (90 Base) MCG/ACT inhaler  Please dispense two if possible. For shortness of breath 6.7 g 5  . buPROPion (WELLBUTRIN) 75 MG tablet Take 1 tablet (75 mg total) by mouth daily. 90 tablet 1  . carbidopa-levodopa (SINEMET IR) 25-100 MG tablet 1/2 tablet with the first and second doses of Sinemet CR 180 tablet 2  . Carbidopa-Levodopa ER (SINEMET CR) 25-100 MG tablet controlled release TAKE (1) TABLET BY MOUTH (4) TIMES DAILY. 360 tablet 0  . furosemide (LASIX) 20 MG tablet One tablet up to 3 times a week for swelling 12 tablet 5   No current facility-administered medications on file prior to visit.    Allergies  Allergen Reactions  . Dyazide [Hydrochlorothiazide W-Triamterene]   . Penicillins Other (See Comments)    Reaction: head felt on fire.    Review of Systems see HPI, all other systems reviewed and are negative    Objective:   Physical Exam  BP (!) 187/76   Pulse 70   Temp 98.4 F (36.9 C)   Ht 5' (1.524 m)   Wt 120 lb 6.4 oz (54.6 kg)   BMI 23.51 kg/m  General: 85 year old female tremulous with parkinsonian gait in no acute distress Eyes: Sclera nonicteric, conjunctiva pink Mouth: Dentition intact, no ulcers or lesions Heart: Soft systolic murmur with a loud S2, regular rate and rhythm Lungs: Breath sounds clear throughout Abdomen: Protuberant, thin abdominal wall, nontender, positive bowel sounds to all 4 quadrants, no HSM Extremities: Right ankle is more edematous than the left Neuro: Alert and oriented x4, upper extremity tremors noted, her speech is clear    Assessment & Plan:   72.  85 year old female with dysphagia -Schedule a barium swallow with tablet -If EGD required she will need cardiac clearance due to recurrent syncope of unclear etiology -Patient advised to drink 3 separate sips of water before swallowing any medication or solid food, cut food in small pieces and chew food thoroughly  2.  Hypertension, lower extremity edema, heart murmur with recurrent syncope -Cardiology evaluation  recommended

## 2019-06-03 ENCOUNTER — Encounter (INDEPENDENT_AMBULATORY_CARE_PROVIDER_SITE_OTHER): Payer: Self-pay | Admitting: *Deleted

## 2019-06-03 ENCOUNTER — Ambulatory Visit (INDEPENDENT_AMBULATORY_CARE_PROVIDER_SITE_OTHER): Payer: PPO | Admitting: Nurse Practitioner

## 2019-06-03 ENCOUNTER — Encounter (INDEPENDENT_AMBULATORY_CARE_PROVIDER_SITE_OTHER): Payer: Self-pay | Admitting: Nurse Practitioner

## 2019-06-03 ENCOUNTER — Other Ambulatory Visit: Payer: Self-pay

## 2019-06-03 DIAGNOSIS — R1319 Other dysphagia: Secondary | ICD-10-CM

## 2019-06-03 DIAGNOSIS — R1310 Dysphagia, unspecified: Secondary | ICD-10-CM | POA: Diagnosis not present

## 2019-06-03 NOTE — Patient Instructions (Signed)
1. Schedule a barium swallow   2. I recommend you contact your primary doctor to consider scheduling a cardiology consult  3. Further follow up to be determined after barium swallow results reviewed

## 2019-06-04 ENCOUNTER — Ambulatory Visit (HOSPITAL_COMMUNITY): Payer: PPO | Admitting: Physical Therapy

## 2019-06-06 ENCOUNTER — Ambulatory Visit (HOSPITAL_COMMUNITY): Payer: PPO | Admitting: Physical Therapy

## 2019-06-13 ENCOUNTER — Other Ambulatory Visit: Payer: Self-pay

## 2019-06-13 ENCOUNTER — Ambulatory Visit (HOSPITAL_COMMUNITY)
Admission: RE | Admit: 2019-06-13 | Discharge: 2019-06-13 | Disposition: A | Discharge status: Home / Self Care | Payer: PPO | Source: Ambulatory Visit | Attending: Nurse Practitioner | Admitting: Nurse Practitioner

## 2019-06-13 DIAGNOSIS — R1319 Other dysphagia: Secondary | ICD-10-CM

## 2019-06-13 DIAGNOSIS — K224 Dyskinesia of esophagus: Secondary | ICD-10-CM | POA: Diagnosis not present

## 2019-06-13 DIAGNOSIS — K449 Diaphragmatic hernia without obstruction or gangrene: Secondary | ICD-10-CM | POA: Diagnosis not present

## 2019-06-13 DIAGNOSIS — R1310 Dysphagia, unspecified: Secondary | ICD-10-CM | POA: Diagnosis not present

## 2019-06-17 ENCOUNTER — Other Ambulatory Visit: Payer: Self-pay | Admitting: Neurology

## 2019-06-27 ENCOUNTER — Encounter: Payer: Self-pay | Admitting: Orthopaedic Surgery

## 2019-06-27 ENCOUNTER — Other Ambulatory Visit (INDEPENDENT_AMBULATORY_CARE_PROVIDER_SITE_OTHER): Payer: Self-pay | Admitting: *Deleted

## 2019-06-27 ENCOUNTER — Ambulatory Visit (INDEPENDENT_AMBULATORY_CARE_PROVIDER_SITE_OTHER): Payer: PPO | Admitting: Orthopaedic Surgery

## 2019-06-27 VITALS — BP 158/88 | HR 99 | Ht 62.0 in | Wt 112.0 lb

## 2019-06-27 DIAGNOSIS — M1611 Unilateral primary osteoarthritis, right hip: Secondary | ICD-10-CM | POA: Diagnosis not present

## 2019-06-27 NOTE — Progress Notes (Signed)
Office Visit Note   Patient: Heather Gilmore           Date of Birth: 1929-11-07           MRN: [id number] Visit Date: 06/27/2019              Requested by: Mikey Kirschner, [address] [place],  [place] [address] PCP: Mikey Kirschner, MD   Assessment & Plan: Visit Diagnoses:  1. Unilateral primary osteoarthritis, right hip     Plan: Patient got good relief from previous injection in August intra-articular injection anteriorly right hip.  She states she still has good relief from this she can return if she has recurrence of symptoms and is happy with results of treatment.  She will call and return if she starts having problems again.  Follow-Up Instructions: Return if symptoms worsen or fail to improve.   Orders:  No orders of the defined types were placed in this encounter.  No orders of the defined types were placed in this encounter.     Procedures: No procedures performed   Clinical Data: No additional findings.   Subjective: Chief Complaint  Patient presents with  . Right Hip - Follow-up    HPI 85 year old female returns for follow-up of severe right hip arthritis bone-on-bone changes.  Previous injection was done and she states it has been a miracle for her and she is walking much better.  She still has some balance problems with Parkinson's and uses a cane to prevent falling.  She states her hip does not hurt any longer.  Review of Systems 14 point update unchanged from my last office visit other than as mentioned in HPI.   Objective: Vital Signs: BP (!) 158/88   Pulse 99   Ht 5\' 2"  (1.575 m)   Wt 112 lb (50.8 kg)   BMI 20.49 kg/m   Physical Exam Constitutional:      Appearance: She is well-developed.  HENT:     Head: Normocephalic.     Right Ear: External ear normal.     Left Ear: External ear normal.  Eyes:     Extraocular Movements: Extraocular movements intact.  Neck:     Thyroid: No thyromegaly.     Trachea: No tracheal  deviation.  Cardiovascular:     Rate and Rhythm: Normal rate.  Pulmonary:     Effort: Pulmonary effort is normal.  Abdominal:     Palpations: Abdomen is soft.  Skin:    General: Skin is warm and dry.  Neurological:     Mental Status: She is alert and oriented to person, place, and time.  Psychiatric:        Behavior: Behavior normal.     Ortho Exam patient walking with single-point cane.  Limitation internal and external rotation of her hip at only 10 and 30 degrees.  Opposite left hip internal and external rotates 30 degrees easily.  Specialty Comments:  No specialty comments available.  Imaging: No results found.   PMFS History: Patient Active Problem List   Diagnosis Date Noted  . Dysphagia 06/03/2019  . Unilateral primary osteoarthritis, right hip 03/21/2019  . Hallucinations 12/26/2018  . Gait abnormality 10/17/2017  . Memory difficulty 10/17/2017  . Urge incontinence 12/19/2013  . 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 Parkinson's disease ([place]) 05/28/2013  . Essential hypertension 06/09/2009  . Asthma 06/09/2009  . GERD 06/09/2009  . SYNCOPE 06/09/2009  . Abnormal involuntary movement 06/09/2009   Past Medical History:  Diagnosis Date  . Arthritis   . Asthma   . Dysphagia   . Essential tremor   . Gait abnormality 10/17/2017  . Hallucinations 12/26/2018  . 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 Hyperlipidemia   . Hypertension   . IBS (irritable bowel syndrome)   . Memory difficulty 10/17/2017  . Parkinson disease ([place])   . Rosacea   . Thyroid nodule     Family History  Problem Relation Age of Onset  . Anesthesia problems Neg Hx   . Hypotension Neg Hx   . Malignant hyperthermia Neg Hx   . Pseudochol deficiency Neg Hx     Past Surgical History:  Procedure Laterality Date  . APPENDECTOMY    . CATARACT EXTRACTION W/PHACO  05/03/2011   Procedure: CATARACT EXTRACTION PHACO AND INTRAOCULAR LENS PLACEMENT (IOC);  Surgeon: Elta Guadeloupe T. Gershon Crane;   Location: [facility];  Service: Ophthalmology;  Laterality: Right;  CDE: 7.52  . CATARACT EXTRACTION W/PHACO  05/17/2011   Procedure: CATARACT EXTRACTION PHACO AND INTRAOCULAR LENS PLACEMENT (IOC);  Surgeon: Elta Guadeloupe T. Gershon Crane;  Location: [facility];  Service: Ophthalmology;  Laterality: Left;  CDE 9.49  . CHOLECYSTECTOMY  6 yrs ago   aph-Dr Arnoldo Morale  . ESOPHAGEAL DILATION N/A 03/02/2016   Procedure: ESOPHAGEAL DILATION;  Surgeon: Rogene Houston, MD;  Location: [facility];  Service: Endoscopy;  Laterality: N/A;  . ESOPHAGOGASTRODUODENOSCOPY N/A 03/02/2016   Procedure: ESOPHAGOGASTRODUODENOSCOPY (EGD);  Surgeon: Rogene Houston, MD;  [facility];  Service: Endoscopy;  Laterality: N/A;  1:25  . TONSILLECTOMY     as child   Social History   Occupational History  . Occupation: Retired  Tobacco Use  . Smoking status: Former Smoker    Packs/day: 1.00    Years: 10.00    Pack years: 10.00    Types: Cigarettes    Quit date: 04/28/1991    Years since quitting: 28.1  . Smokeless tobacco: Never Used  Substance and Sexual Activity  . Alcohol use: No  . Drug use: No  . Sexual activity: Not Currently

## 2019-07-23 ENCOUNTER — Other Ambulatory Visit: Payer: Self-pay | Admitting: Neurology

## 2019-08-13 ENCOUNTER — Other Ambulatory Visit: Payer: Self-pay

## 2019-08-13 ENCOUNTER — Ambulatory Visit: Payer: PPO | Attending: Internal Medicine

## 2019-08-13 DIAGNOSIS — Z20822 Contact with and (suspected) exposure to covid-19: Secondary | ICD-10-CM

## 2019-08-15 LAB — NOVEL CORONAVIRUS, NAA: SARS-CoV-2, NAA: NOT DETECTED

## 2019-08-20 ENCOUNTER — Ambulatory Visit: Payer: Medicare Other | Attending: Internal Medicine

## 2019-08-20 DIAGNOSIS — Z23 Encounter for immunization: Secondary | ICD-10-CM | POA: Insufficient documentation

## 2019-08-20 NOTE — Progress Notes (Signed)
   Covid-19 Vaccination Clinic  Name:  Heather Gilmore    MRN: [id number] DOB: 1930-03-24  08/20/2019  [name] was observed post Covid-19 immunization for 15 minutes without incidence. She was provided with Vaccine Information Sheet and instruction to access the V-Safe system.   [name] was instructed to call 911 with any severe reactions post vaccine: Marland Kitchen Difficulty breathing  . Swelling of your face and throat  . A fast heartbeat  . A bad rash all over your body  . Dizziness and weakness    Immunizations Administered    Name Date Dose VIS Date Route   Pfizer COVID-19 Vaccine 08/20/2019  9:39 AM 0.3 mL 07/19/2019 Intramuscular   Manufacturer: [employer], [employer]   Lot: [id number]   [place]: [id number]

## 2019-09-09 ENCOUNTER — Ambulatory Visit: Payer: PPO | Attending: Internal Medicine

## 2019-09-09 DIAGNOSIS — Z23 Encounter for immunization: Secondary | ICD-10-CM | POA: Insufficient documentation

## 2019-09-09 NOTE — Progress Notes (Signed)
   Covid-19 Vaccination Clinic  Name:  Heather Gilmore    MRN: [id number] DOB: 06/08/1930  09/09/2019  [name] was observed post Covid-19 immunization for 15 minutes without incidence. She was provided with Vaccine Information Sheet and instruction to access the V-Safe system.   [name] was instructed to call 911 with any severe reactions post vaccine: Marland Kitchen Difficulty breathing  . Swelling of your face and throat  . A fast heartbeat  . A bad rash all over your body  . Dizziness and weakness    Immunizations Administered    Name Date Dose VIS Date Route   Pfizer COVID-19 Vaccine 09/09/2019 10:03 AM 0.3 mL 07/19/2019 Intramuscular   Manufacturer: [employer]   Lot: [id number]   [place]: [id number]

## 2019-09-12 ENCOUNTER — Encounter: Payer: Self-pay | Admitting: Family Medicine

## 2019-09-18 ENCOUNTER — Ambulatory Visit (INDEPENDENT_AMBULATORY_CARE_PROVIDER_SITE_OTHER): Payer: PPO | Admitting: Family Medicine

## 2019-09-18 ENCOUNTER — Encounter: Payer: Self-pay | Admitting: Family Medicine

## 2019-09-18 ENCOUNTER — Other Ambulatory Visit: Payer: Self-pay | Admitting: Neurology

## 2019-09-18 ENCOUNTER — Other Ambulatory Visit: Payer: Self-pay

## 2019-09-18 VITALS — Temp 97.8°F

## 2019-09-18 DIAGNOSIS — S4992XA Unspecified injury of left shoulder and upper arm, initial encounter: Secondary | ICD-10-CM

## 2019-09-18 DIAGNOSIS — M25512 Pain in left shoulder: Secondary | ICD-10-CM

## 2019-09-18 NOTE — Progress Notes (Signed)
   Subjective:    Patient ID: Heather Gilmore, female    DOB: [birth year], 86 y.o.   MRN: [id number]  HPI  Patient arrives for a check after a fall this am. Patient with multiple skin tears on left arm.  Patient arrives after suffering a fall.  Struck her left arm.  On the way down.  Able to move all extremities.  Able to walk okay.  Reports she also scraped her forehead.  Has ongoing complaints about 24/7 nasal drainage.  Unresponsive to oral antihistamines and steroid sprays in the past  Review of Systems No headache no chest pain no loss of consciousness no shortness of breath    Objective:   Physical Exam  Alert active hydration good on repeat HEENT frontal abrasion mild neck supple.  Lungs clear heart regular rhythm.  Left arm impressive denuding abrasion of geriatric skin. Multiple regions of partial avulsion from subcutaneous compartment     Assessment & Plan:  Impression injury wound management discussed.  Frequent dressing changes expect slow resolution.  Local measures discussed.  Will add Astelin spray as needed warning signs discussed

## 2019-09-19 ENCOUNTER — Telehealth: Payer: Self-pay | Admitting: Family Medicine

## 2019-09-19 MED ORDER — AZELASTINE HCL 0.1 % NA SOLN: 2.0000 | Freq: Two times a day (BID) | NASAL | 0 refills | Status: DC

## 2019-09-19 NOTE — Telephone Encounter (Signed)
Pt's daughter is checking on the nasal spray that Dr. Richardson Landry was going to call in. Pharmacy states they do not have anything.   [place], [place]

## 2019-09-19 NOTE — Telephone Encounter (Signed)
Per Dr Richardson Landry:  Astelin nasal spray 2 sprays each nostril daily. Prescription sent electronically to pharmacy. Daughter(DPR) notified.

## 2019-09-26 ENCOUNTER — Ambulatory Visit: Payer: PPO | Admitting: Neurology

## 2019-10-23 ENCOUNTER — Other Ambulatory Visit: Payer: Self-pay | Admitting: Neurology

## 2019-10-25 ENCOUNTER — Other Ambulatory Visit: Payer: Self-pay | Admitting: Neurology

## 2019-12-02 ENCOUNTER — Ambulatory Visit: Payer: PPO | Admitting: Neurology

## 2019-12-02 ENCOUNTER — Other Ambulatory Visit: Payer: Self-pay

## 2019-12-02 ENCOUNTER — Encounter: Payer: Self-pay | Admitting: Neurology

## 2019-12-02 VITALS — BP 179/71 | HR 64 | Temp 97.6°F | Wt 118.0 lb

## 2019-12-02 DIAGNOSIS — G20 Parkinson's disease: Secondary | ICD-10-CM | POA: Diagnosis not present

## 2019-12-02 DIAGNOSIS — R413 Other amnesia: Secondary | ICD-10-CM

## 2019-12-02 DIAGNOSIS — R269 Unspecified abnormalities of gait and mobility: Secondary | ICD-10-CM

## 2019-12-02 MED ORDER — ENTACAPONE 200 MG PO TABS: 200.0000 mg | ORAL_TABLET | Freq: Four times a day (QID) | ORAL | 3 refills | Status: DC

## 2019-12-02 MED ORDER — CARBIDOPA-LEVODOPA 25-100 MG PO TABS: ORAL_TABLET | ORAL | 0 refills | Status: DC

## 2019-12-02 NOTE — Patient Instructions (Signed)
We will start Comtan 200 mg 4 times a day with the Sinemet (carbidopa).

## 2019-12-02 NOTE — Progress Notes (Signed)
Reason for visit: Parkinson's disease, memory disturbance, gait disturbance  Heather Gilmore is an 86 y.o. female  History of present illness:  [name] is a 86 year old right-handed white female with a history of Parkinson's disease.  The patient has had some decline in her ability to walk since last seen, she has an occasional fall, the last fall occurred in February 2021.  The patient uses a cane for ambulation which does help.  The patient has not had any further problems with hallucinations, she believes that her memory has done well.  She is not sleeping well at night but she sleeps 4 to 5 hours during the daytime.  She remains on Sinemet 25/100 CR tablets taking 1 tablet 4 times daily and she takes one half of a 25/100 Sinemet IR tablet with the first and third doses of Sinemet CR.  The patient returns for further evaluation.  She tries to exercise by walking up and down the halls at home.  Past Medical History:  Diagnosis Date  . Arthritis   . Asthma   . Dysphagia   . Essential tremor   . Gait abnormality 10/17/2017  . Hallucinations 12/26/2018  . 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 Hyperlipidemia   . Hypertension   . IBS (irritable bowel syndrome)   . Memory difficulty 10/17/2017  . Parkinson disease ([place])   . Rosacea   . Thyroid nodule     Past Surgical History:  Procedure Laterality Date  . APPENDECTOMY    . CATARACT EXTRACTION W/PHACO  05/03/2011   Procedure: CATARACT EXTRACTION PHACO AND INTRAOCULAR LENS PLACEMENT (IOC);  Surgeon: Elta Guadeloupe T. Gershon Crane;  Location: [facility];  Service: Ophthalmology;  Laterality: Right;  CDE: 7.52  . CATARACT EXTRACTION W/PHACO  05/17/2011   Procedure: CATARACT EXTRACTION PHACO AND INTRAOCULAR LENS PLACEMENT (IOC);  Surgeon: Elta Guadeloupe T. Gershon Crane;  Location: [facility];  Service: Ophthalmology;  Laterality: Left;  CDE 9.49  . CHOLECYSTECTOMY  6 yrs ago   aph-Dr Arnoldo Morale  . ESOPHAGEAL DILATION N/A 03/02/2016   Procedure: ESOPHAGEAL DILATION;   Surgeon: Rogene Houston, MD;  [facility];  Service: Endoscopy;  Laterality: N/A;  . ESOPHAGOGASTRODUODENOSCOPY N/A 03/02/2016   Procedure: ESOPHAGOGASTRODUODENOSCOPY (EGD);  Surgeon: Rogene Houston, MD;  [facility];  Service: Endoscopy;  Laterality: N/A;  1:25  . TONSILLECTOMY     as child    Family History  Problem Relation Age of Onset  . Anesthesia problems Neg Hx   . Hypotension Neg Hx   . Malignant hyperthermia Neg Hx   . Pseudochol deficiency Neg Hx     Social history:  reports that she quit smoking about 28 years ago. Her smoking use included cigarettes. She has a 10.00 pack-year smoking history. She has never used smokeless tobacco. She reports that she does not drink alcohol or use drugs.    Allergies  Allergen Reactions  . Dyazide [Hydrochlorothiazide W-Triamterene]   . Penicillins Other (See Comments)    Reaction: head felt on fire.    Medications:  Prior to Admission medications   Medication Sig Start Date End Date Taking? Authorizing Provider  acetaminophen (TYLENOL) 500 MG tablet Take 500 mg by mouth every 6 (six) hours as needed. For pain    Yes [provider]  albuterol (PROVENTIL HFA;VENTOLIN HFA) 108 (90 Base) MCG/ACT inhaler Please dispense two if possible. For shortness of breath 08/28/18  Yes Mikey Kirschner, MD  azelastine (ASTELIN) 0.1 %  nasal spray Place 2 sprays into both nostrils 2 (two) times daily. 09/19/19  Yes Mikey Kirschner, MD  buPROPion (WELLBUTRIN) 75 MG tablet TAKE (1) TABLET BY MOUTH ONCE DAILY. 10/23/19  Yes Kathrynn Ducking, MD  carbidopa-levodopa (SINEMET IR) 25-100 MG tablet TAKE 1/2 TABLET BY MOUTH 4 TIMES DAILY. 10/25/19  Yes Kathrynn Ducking, MD  Carbidopa-Levodopa ER (SINEMET CR) 25-100 MG tablet controlled release TAKE (1) TABLET BY MOUTH (4) TIMES DAILY. 09/18/19  Yes Kathrynn Ducking, MD  cetirizine (ZYRTEC) 10 MG tablet Take 10 mg by mouth daily.   Yes [provider]  furosemide  (LASIX) 20 MG tablet One tablet up to 3 times a week for swelling 02/26/19  Yes Mikey Kirschner, MD    ROS:  Out of a complete 14 system review of symptoms, the patient complains only of the following symptoms, and all other reviewed systems are negative.  Memory problems Sinus drainage Walking difficulty Tremors  Blood pressure (!) 179/71, pulse 64, temperature 97.6 F (36.4 C), weight 118 lb (53.5 kg).  Physical Exam  General: The patient is alert and cooperative at the time of the examination.  Skin: No significant peripheral edema is noted.   Neurologic Exam  Mental status: The patient is alert and oriented x 3 at the time of the examination. The patient has apparent normal recent and remote memory, with an apparently normal attention span and concentration ability.   Cranial nerves: Facial symmetry is present. Speech is normal, no aphasia or dysarthria is noted. Extraocular movements are full. Visual fields are full.  Masking of the face is seen.  Motor: The patient has good strength in all 4 extremities.  Sensory examination: Soft touch sensation is symmetric on the face, arms, and legs.  Coordination: The patient has good finger-nose-finger and heel-to-shin bilaterally.  Some apraxia with use of the lower extremities is noted.  Resting tremors are noted with both upper extremities, left greater than right.  Gait and station: The patient is able to arise from a seated position with arms crossed with some difficulty.  Once up, the patient has a stooped posture, decreased arm swing bilaterally, she will shuffle her feet and take short steps with walking, she shuffles when trying to make a turn.  Tandem gait was not attempted.  Romberg is negative.  Reflexes: Deep tendon reflexes are symmetric.   Assessment/Plan:  1.  Parkinson's disease  2.  Mild memory disturbance  3.  Gait disturbance  There has been some change in her ability to walk since last seen.   Previously, higher doses of Sinemet have resulted in some confusion and hallucinations.  We may add Comtan to the Sinemet doses currently, the family will contact me if confusion ensues.  The patient will follow-up otherwise in 4 months.  The patient did get physical therapy and September 2020 for her walking.   Jill Alexanders MD 12/02/2019 10:44 AM  [facility] [address] [place], [place] [address]  Phone [phone] Fax [phone]

## 2019-12-04 ENCOUNTER — Ambulatory Visit: Payer: Self-pay | Admitting: Neurology

## 2020-01-23 ENCOUNTER — Other Ambulatory Visit: Payer: Self-pay

## 2020-01-23 MED ORDER — BUPROPION HCL 75 MG PO TABS: ORAL_TABLET | ORAL | 1 refills | Status: DC

## 2020-02-03 ENCOUNTER — Ambulatory Visit (HOSPITAL_COMMUNITY)
Admission: RE | Admit: 2020-02-03 | Discharge: 2020-02-03 | Disposition: A | Discharge status: Home / Self Care | Payer: PPO | Source: Ambulatory Visit | Attending: Family Medicine | Admitting: Family Medicine

## 2020-02-03 ENCOUNTER — Encounter: Payer: Self-pay | Admitting: Family Medicine

## 2020-02-03 ENCOUNTER — Other Ambulatory Visit: Payer: Self-pay

## 2020-02-03 ENCOUNTER — Ambulatory Visit (INDEPENDENT_AMBULATORY_CARE_PROVIDER_SITE_OTHER): Payer: PPO | Admitting: Family Medicine

## 2020-02-03 VITALS — BP 128/80 | HR 71 | Temp 97.7°F | Wt 116.2 lb

## 2020-02-03 DIAGNOSIS — W010XXA Fall on same level from slipping, tripping and stumbling without subsequent striking against object, initial encounter: Secondary | ICD-10-CM

## 2020-02-03 DIAGNOSIS — S4992XA Unspecified injury of left shoulder and upper arm, initial encounter: Secondary | ICD-10-CM | POA: Diagnosis not present

## 2020-02-03 DIAGNOSIS — Z23 Encounter for immunization: Secondary | ICD-10-CM | POA: Diagnosis not present

## 2020-02-03 DIAGNOSIS — M79602 Pain in left arm: Secondary | ICD-10-CM | POA: Diagnosis not present

## 2020-02-03 DIAGNOSIS — R053 Chronic cough: Secondary | ICD-10-CM

## 2020-02-03 DIAGNOSIS — R05 Cough: Secondary | ICD-10-CM | POA: Diagnosis not present

## 2020-02-03 DIAGNOSIS — S51812S Laceration without foreign body of left forearm, sequela: Secondary | ICD-10-CM

## 2020-02-03 DIAGNOSIS — H6500 Acute serous otitis media, unspecified ear: Secondary | ICD-10-CM | POA: Diagnosis not present

## 2020-02-03 DIAGNOSIS — S51812A Laceration without foreign body of left forearm, initial encounter: Secondary | ICD-10-CM | POA: Diagnosis not present

## 2020-02-03 MED ORDER — CEFDINIR 300 MG PO CAPS: 300.0000 mg | ORAL_CAPSULE | Freq: Two times a day (BID) | ORAL | 0 refills | Status: DC

## 2020-02-03 NOTE — Progress Notes (Signed)
Patient ID: Heather Gilmore, female    DOB: 13-Nov-1929, 86 y.o.   MRN: [id number]   Chief Complaint  Patient presents with  . Ear Pain    Pt has been having left ear pain off and on. No drainage. pt has had an ongoing cough that will cause chest pain. pt has tried Tylenol and Motrin. Going on for about one month. pt states that she also has a sensation in right ear that it is going to begin to hurt. Pt daughter Heather Gilmore would like mom to recieve Tetanus shot today.    Subjective:    HPI Pt having chronic cough, non-productive.  Rt ear pain, going on 1 month. Feeling fullness in rt ear.  Chronic runny nose and clear.  Taking zyrtec and asteline. Has h/o parkinson's. Normal Cr, ok to take nsaids.  Past history smoker, 1ppd-over 30 yrs quit smoking.  Pt has had a lot of skin tears from falls in last few months. Left forearm with 2 healing skin tears.   Daughter wanting pt to have tetanus updated, due to having covid vaccine, so she had to wait.  Pt has tolerated cefdinir in the past 2015, per chart.  Medical History Heather Gilmore has a past medical history of Arthritis, Asthma, Dysphagia, Essential tremor, Gait abnormality (10/17/2017), Hallucinations (12/26/2018), Heart murmur, Hyperlipidemia, Hypertension, IBS (irritable bowel syndrome), Memory difficulty (10/17/2017), Parkinson disease ([place]), Rosacea, and Thyroid nodule.   Outpatient Encounter Medications as of 02/03/2020  Medication Sig  . acetaminophen (TYLENOL) 500 MG tablet Take 500 mg by mouth every 6 (six) hours as needed. For pain   . albuterol (PROVENTIL HFA;VENTOLIN HFA) 108 (90 Base) MCG/ACT inhaler Please dispense two if possible. For shortness of breath  . azelastine (ASTELIN) 0.1 % nasal spray Place 2 sprays into both nostrils 2 (two) times daily.  Marland Kitchen buPROPion (WELLBUTRIN) 75 MG tablet TAKE (1) TABLET BY MOUTH ONCE DAILY.  . carbidopa-levodopa (SINEMET IR) 25-100 MG tablet TAKE 1/2 TABLET BY MOUTH 2 TIMES DAILY.  .  Carbidopa-Levodopa ER (SINEMET CR) 25-100 MG tablet controlled release TAKE (1) TABLET BY MOUTH (4) TIMES DAILY.  . cetirizine (ZYRTEC) 10 MG tablet Take 10 mg by mouth daily.  . entacapone (COMTAN) 200 MG tablet Take 1 tablet (200 mg total) by mouth 4 (four) times daily.  . furosemide (LASIX) 20 MG tablet One tablet up to 3 times a week for swelling  . cefdinir (OMNICEF) 300 MG capsule Take 1 capsule (300 mg total) by mouth 2 (two) times daily.   No facility-administered encounter medications on file as of 02/03/2020.     Review of Systems  Constitutional: Negative for chills and fever.  HENT: Positive for ear pain. Negative for congestion, postnasal drip, rhinorrhea, sinus pain and sore throat.   Respiratory: Positive for cough. Negative for shortness of breath and wheezing.   Cardiovascular: Negative for chest pain and leg swelling.  Gastrointestinal: Negative for abdominal pain, diarrhea, nausea and vomiting.  Genitourinary: Negative for dysuria and frequency.  Musculoskeletal: Negative for arthralgias and back pain.  Skin: Positive for wound (2 healing skin tears on left forearm). Negative for rash.  Neurological: Negative for dizziness, weakness and headaches.     Vitals BP 128/80   Pulse 71   Temp 97.7 F (36.5 C)   Wt 116 lb 3.2 oz (52.7 kg)   SpO2 97%   BMI 21.25 kg/m   Objective:   Physical Exam Vitals and nursing note reviewed.  Constitutional:      Appearance:  Normal appearance.  HENT:     Head: Normocephalic and atraumatic.     Nose: Nose normal. No rhinorrhea.     Mouth/Throat:     Mouth: Mucous membranes are moist.     Pharynx: Oropharynx is clear. No posterior oropharyngeal erythema.  Eyes:     Extraocular Movements: Extraocular movements intact.     Conjunctiva/sclera: Conjunctivae normal.     Pupils: Pupils are equal, round, and reactive to light.  Cardiovascular:     Rate and Rhythm: Normal rate and regular rhythm.     Pulses: Normal pulses.      Heart sounds: Normal heart sounds.  Pulmonary:     Effort: Pulmonary effort is normal. No respiratory distress.     Breath sounds: Normal breath sounds. No wheezing, rhonchi or rales.  Musculoskeletal:        General: Normal range of motion.     Right lower leg: No edema.     Left lower leg: No edema.  Skin:    General: Skin is warm and dry.     Findings: No lesion or rash.     Comments: 2 healing skin tears on left forearm.  Neurological:     General: No focal deficit present.     Mental Status: She is alert and oriented to person, place, and time.     Cranial Nerves: No cranial nerve deficit.  Psychiatric:        Mood and Affect: Mood normal.        Behavior: Behavior normal.      Assessment and Plan   1. Acute serous otitis media, recurrence not specified, unspecified laterality - cefdinir (OMNICEF) 300 MG capsule; Take 1 capsule (300 mg total) by mouth 2 (two) times daily.  Dispense: 20 capsule; Refill: 0  2. Persistent cough - DG Chest 2 View; Future  3. Arm injury, left, initial encounter - Td vaccine greater than or equal to 7yo preservative free IM  4. Skin tear of left forearm without complication, sequela - Td vaccine greater than or equal to 7yo preservative free IM   Call daughter with results of xray.  F/u prn or if not improving then rto.  Daughter in agreement.

## 2020-02-04 ENCOUNTER — Telehealth: Payer: Self-pay | Admitting: Family Medicine

## 2020-02-04 MED ORDER — LEVOFLOXACIN 750 MG PO TABS: 750.0000 mg | ORAL_TABLET | Freq: Every day | ORAL | 0 refills | Status: DC

## 2020-02-04 NOTE — Telephone Encounter (Signed)
Lmtc

## 2020-02-04 NOTE — Telephone Encounter (Signed)
Patients daughter notified of results and scheduled appointment for 10 days.

## 2020-02-18 ENCOUNTER — Encounter: Payer: Self-pay | Admitting: Family Medicine

## 2020-02-18 ENCOUNTER — Ambulatory Visit (INDEPENDENT_AMBULATORY_CARE_PROVIDER_SITE_OTHER): Payer: PPO | Admitting: Family Medicine

## 2020-02-18 ENCOUNTER — Other Ambulatory Visit: Payer: Self-pay

## 2020-02-18 ENCOUNTER — Other Ambulatory Visit: Payer: Self-pay | Admitting: Family Medicine

## 2020-02-18 ENCOUNTER — Ambulatory Visit (HOSPITAL_COMMUNITY)
Admission: RE | Admit: 2020-02-18 | Discharge: 2020-02-18 | Disposition: A | Discharge status: Home / Self Care | Payer: PPO | Source: Ambulatory Visit | Attending: Family Medicine | Admitting: Family Medicine

## 2020-02-18 VITALS — BP 122/70 | Temp 97.4°F | Ht 62.0 in | Wt 116.0 lb

## 2020-02-18 DIAGNOSIS — M19012 Primary osteoarthritis, left shoulder: Secondary | ICD-10-CM | POA: Diagnosis not present

## 2020-02-18 DIAGNOSIS — M25551 Pain in right hip: Secondary | ICD-10-CM | POA: Diagnosis not present

## 2020-02-18 DIAGNOSIS — S79911A Unspecified injury of right hip, initial encounter: Secondary | ICD-10-CM | POA: Diagnosis not present

## 2020-02-18 DIAGNOSIS — J189 Pneumonia, unspecified organism: Secondary | ICD-10-CM | POA: Diagnosis not present

## 2020-02-18 DIAGNOSIS — M25512 Pain in left shoulder: Secondary | ICD-10-CM | POA: Diagnosis not present

## 2020-02-18 DIAGNOSIS — W19XXXA Unspecified fall, initial encounter: Secondary | ICD-10-CM

## 2020-02-18 DIAGNOSIS — M1611 Unilateral primary osteoarthritis, right hip: Secondary | ICD-10-CM | POA: Diagnosis not present

## 2020-02-18 DIAGNOSIS — S4992XA Unspecified injury of left shoulder and upper arm, initial encounter: Secondary | ICD-10-CM | POA: Diagnosis not present

## 2020-02-18 NOTE — Patient Instructions (Signed)
Take 650mg  tylenol every 6 hrs as needed for pain. Use ice or heat 59mins 3x per day as needed.

## 2020-02-18 NOTE — Progress Notes (Signed)
Spoke with pt's daughter last night at 6:30pm about rt hip xray, showing possible fracture to femoral neck.  Advising tylenol 650mg  q4-6hrs prn. Since pain was mild in office, and pt was having good relief with this.  If severe pain then go to ER.  Daughter in agreement.  Nurses- I already ordered stat CT hip-right for tomorrow am.  Please call pt's (daughter) with info on this for tomorrow.    Thx. Dr. Lovena Le

## 2020-02-18 NOTE — Progress Notes (Signed)
Patient ID: Heather Gilmore, female    DOB: 05/17/1930, 86 y.o.   MRN: [id number]   Chief Complaint  Patient presents with  . Pneumonia   Subjective:    HPI  Follow up on recent pneumonia.  Pt had multilobar pneumonia and treated with levaquin.. Pt seen with son. Pt coming in with multiple new complaints today.  Pt states both ears are still sore and not any better.   Left shoulder pain for a while.   Fell 5 days and having right hip pain. Per son, pt walking with a limp.  Pt not wanting to take medications for the pain.  Intermittently taking tylenol 1x per day.  Had left shoulder pain and hurting and fell 5 days ago and having rt hip pain from fall.  Doing laundry and fell over and hit bathroom vanity.  Pt has been walking on the leg with a limp and using a cane.  Seeing ortho for rt hip pain, has cortisone shot only lasting 3 wks. 1 yr ago injection. Orthopedics in [place].  Taking tylenol 650mg  1x per day for the pain.  Pt stating it's taking the edge off the pain.  Doesn't like taking many  Medications.  Not moving around as well, per the son. More pain with standing on rt hip. Hard to lift up the left shoulder.  Coughing and breathing improved after antibiotics. Pt still having left ear pain. Using flonase prn, not using daily.  Medical History Heather Gilmore has a past medical history of Arthritis, Asthma, Dysphagia, Essential tremor, Gait abnormality (10/17/2017), Hallucinations (12/26/2018), Heart murmur, Hyperlipidemia, Hypertension, IBS (irritable bowel syndrome), Memory difficulty (10/17/2017), Parkinson disease ([place]), Rosacea, and Thyroid nodule.   Outpatient Encounter Medications as of 02/18/2020  Medication Sig  . acetaminophen (TYLENOL) 500 MG tablet Take 500 mg by mouth every 6 (six) hours as needed. For pain   . albuterol (PROVENTIL HFA;VENTOLIN HFA) 108 (90 Base) MCG/ACT inhaler Please dispense two if possible. For shortness of breath  . azelastine (ASTELIN) 0.1 % nasal  spray Place 2 sprays into both nostrils 2 (two) times daily.  Marland Kitchen buPROPion (WELLBUTRIN) 75 MG tablet TAKE (1) TABLET BY MOUTH ONCE DAILY.  . carbidopa-levodopa (SINEMET IR) 25-100 MG tablet TAKE 1/2 TABLET BY MOUTH 2 TIMES DAILY.  . Carbidopa-Levodopa ER (SINEMET CR) 25-100 MG tablet controlled release TAKE (1) TABLET BY MOUTH (4) TIMES DAILY.  . cetirizine (ZYRTEC) 10 MG tablet Take 10 mg by mouth daily.  . entacapone (COMTAN) 200 MG tablet Take 1 tablet (200 mg total) by mouth 4 (four) times daily.  . furosemide (LASIX) 20 MG tablet Take 20 mg by mouth. Take one up to three times a week for swelling  . [DISCONTINUED] cefdinir (OMNICEF) 300 MG capsule Take 1 capsule (300 mg total) by mouth 2 (two) times daily.  . [DISCONTINUED] furosemide (LASIX) 20 MG tablet One tablet up to 3 times a week for swelling  . [DISCONTINUED] levofloxacin (LEVAQUIN) 750 MG tablet Take 1 tablet (750 mg total) by mouth daily.   No facility-administered encounter medications on file as of 02/18/2020.     Review of Systems  Constitutional: Negative for chills and fever.  HENT: Positive for ear pain (left). Negative for congestion, rhinorrhea and sore throat.   Respiratory: Negative for cough, shortness of breath and wheezing.   Cardiovascular: Negative for chest pain and leg swelling.  Gastrointestinal: Negative for abdominal pain, diarrhea, nausea and vomiting.  Genitourinary: Negative for dysuria and frequency.  Musculoskeletal: Negative for arthralgias,  back pain, joint swelling, neck pain and neck stiffness.       +rt hip pain and left hip pain.  Skin: Negative for rash.  Neurological: Negative for dizziness, weakness and headaches.       +fall, last week      Vitals BP 122/70   Temp (!) 97.4 F (36.3 C)   Ht 5\' 2"  (1.575 m)   Wt 116 lb (52.6 kg)   BMI 21.22 kg/m   Objective:   Physical Exam Vitals and nursing note reviewed.  Constitutional:      Appearance: Normal appearance.  HENT:      Head: Normocephalic and atraumatic.     Nose: Nose normal.     Mouth/Throat:     Mouth: Mucous membranes are moist.     Pharynx: Oropharynx is clear.  Eyes:     Extraocular Movements: Extraocular movements intact.     Conjunctiva/sclera: Conjunctivae normal.     Pupils: Pupils are equal, round, and reactive to light.  Cardiovascular:     Rate and Rhythm: Normal rate and regular rhythm.     Pulses: Normal pulses.     Heart sounds: Normal heart sounds.  Pulmonary:     Effort: Pulmonary effort is normal.     Breath sounds: Normal breath sounds. No wheezing, rhonchi or rales.  Musculoskeletal:        General: Normal range of motion.     Right lower leg: No edema.     Left lower leg: No edema.     Comments: +dec rom with external and internal rotation on right hip.  Pain radiating to rt groin.  Pulse normal bilaterally in lower ext. Normal rom on left hip.   Normal rom of rt knee/foot.  +rt shoulder pain- with abduction.  Pain with palpation in anterior and posterior. No ecchymosis or erythema. Normal rom of rt elbow and wrist.  Normal pulse and cap refill.   Skin:    General: Skin is warm and dry.     Findings: No lesion or rash.  Neurological:     General: No focal deficit present.     Mental Status: She is alert and oriented to person, place, and time.  Psychiatric:        Mood and Affect: Mood normal.        Behavior: Behavior normal.      Assessment and Plan   1. Acute pain of left shoulder - DG Shoulder Left; Future  2. Acute pain of right hip - CT HIP RIGHT WO CONTRAST; Future  3. Fall, initial encounter  4. Pneumonia of both lungs due to infectious organism, unspecified part of lung   Xray rt hip and left shoulder ordered.  Hip xray was called in. Tylenol every 6hrs prn pain.  Rest, ice, and elevation prn.  - xray results to daughter, Heather Gilmore, number in chart.  Pneumonia- levaquin finished.  Coughing improved.  -have pt f/u recheck xray for resolution of  pneumonia in 3 months.  F/u 3 mon or prn.  Addendum- xray of rt hip showing irregularity near the femoral neck.  Recommending CT rt hip. Order placed.  Pt called.

## 2020-02-19 ENCOUNTER — Ambulatory Visit (HOSPITAL_COMMUNITY)
Admission: RE | Admit: 2020-02-19 | Discharge: 2020-02-19 | Disposition: A | Discharge status: Home / Self Care | Payer: PPO | Source: Ambulatory Visit | Attending: Family Medicine | Admitting: Family Medicine

## 2020-02-19 DIAGNOSIS — M25551 Pain in right hip: Secondary | ICD-10-CM | POA: Diagnosis not present

## 2020-02-23 ENCOUNTER — Encounter: Payer: Self-pay | Admitting: Family Medicine

## 2020-03-25 ENCOUNTER — Other Ambulatory Visit: Payer: Self-pay | Admitting: Neurology

## 2020-04-17 ENCOUNTER — Other Ambulatory Visit: Payer: Self-pay | Admitting: Neurology

## 2020-04-20 ENCOUNTER — Other Ambulatory Visit: Payer: Self-pay | Admitting: Neurology

## 2020-05-05 ENCOUNTER — Ambulatory Visit: Payer: PPO | Admitting: Neurology

## 2020-05-05 ENCOUNTER — Encounter: Payer: Self-pay | Admitting: Neurology

## 2020-05-05 VITALS — BP 130/70 | HR 60 | Ht 62.0 in | Wt 112.0 lb

## 2020-05-05 DIAGNOSIS — G20 Parkinson's disease: Secondary | ICD-10-CM | POA: Diagnosis not present

## 2020-05-05 DIAGNOSIS — R443 Hallucinations, unspecified: Secondary | ICD-10-CM

## 2020-05-05 DIAGNOSIS — R413 Other amnesia: Secondary | ICD-10-CM

## 2020-05-05 DIAGNOSIS — R269 Unspecified abnormalities of gait and mobility: Secondary | ICD-10-CM | POA: Diagnosis not present

## 2020-05-05 MED ORDER — QUETIAPINE FUMARATE 25 MG PO TABS: 25.0000 mg | ORAL_TABLET | Freq: Every day | ORAL | 3 refills | Status: DC

## 2020-05-05 MED ORDER — CARBIDOPA-LEVODOPA 25-100 MG PO TABS: ORAL_TABLET | ORAL | 1 refills | Status: DC

## 2020-05-05 MED ORDER — CARBIDOPA-LEVODOPA ER 25-100 MG PO TBCR: EXTENDED_RELEASE_TABLET | ORAL | 1 refills | Status: DC

## 2020-05-05 NOTE — Progress Notes (Signed)
Reason for visit: Parkinson's disease, memory disturbance, gait disturbance  Heather Gilmore is an 86 y.o. female  History of present illness:  Heather Gilmore is an 86 year old right-handed white female with a history of Parkinson's disease associated with a gait disorder.  She also has some mild memory issues.  She comes in with her daughter today.  The patient has had some vivid dreams and hallucinations in the evenings, and some hallucinations during the day as well.  This has worsened with the addition of Comtan to the medical regimen.  The patient has cut back to Comtan 100 mg with the first and second doses and she takes a half of a Sinemet IR 25/100 mg tablet with the first and second doses of the Sinemet CR.  She does have dyskinesias that seem to be worse later in the day.  She does have some freezing episodes as well that may occur.  She also reports problems with drooling.  She uses a cane for ambulation, she has not had any falls since last seen.  She returns to the office today for an evaluation.  Past Medical History:  Diagnosis Date  . Arthritis   . Asthma   . Dysphagia   . Essential tremor   . Gait abnormality 10/17/2017  . Hallucinations 12/26/2018  . 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 Hyperlipidemia   . Hypertension   . IBS (irritable bowel syndrome)   . Memory difficulty 10/17/2017  . Parkinson disease ([place])   . Rosacea   . Thyroid nodule     Past Surgical History:  Procedure Laterality Date  . APPENDECTOMY    . CATARACT EXTRACTION W/PHACO  05/03/2011   Procedure: CATARACT EXTRACTION PHACO AND INTRAOCULAR LENS PLACEMENT (IOC);  Surgeon: Elta Guadeloupe T. Gershon Crane;  Location: [facility];  Service: Ophthalmology;  Laterality: Right;  CDE: 7.52  . CATARACT EXTRACTION W/PHACO  05/17/2011   Procedure: CATARACT EXTRACTION PHACO AND INTRAOCULAR LENS PLACEMENT (IOC);  Surgeon: Elta Guadeloupe T. Gershon Crane;  Location: [facility];  Service: Ophthalmology;  Laterality: Left;  CDE 9.49  .  CHOLECYSTECTOMY  6 yrs ago   aph-Dr Arnoldo Morale  . ESOPHAGEAL DILATION N/A 03/02/2016   Procedure: ESOPHAGEAL DILATION;  Surgeon: Rogene Houston, MD;  [facility];  Service: Endoscopy;  Laterality: N/A;  . ESOPHAGOGASTRODUODENOSCOPY N/A 03/02/2016   Procedure: ESOPHAGOGASTRODUODENOSCOPY (EGD);  Surgeon: Rogene Houston, MD;  [facility];  Service: Endoscopy;  Laterality: N/A;  1:25  . TONSILLECTOMY     as child    Family History  Problem Relation Age of Onset  . Anesthesia problems Neg Hx   . Hypotension Neg Hx   . Malignant hyperthermia Neg Hx   . Pseudochol deficiency Neg Hx     Social history:  reports that she quit smoking about 29 years ago. Her smoking use included cigarettes. She has a 10.00 pack-year smoking history. She has never used smokeless tobacco. She reports that she does not drink alcohol and does not use drugs.    Allergies  Allergen Reactions  . Dyazide [Hydrochlorothiazide W-Triamterene]   . Penicillins Other (See Comments)    Reaction: head felt on fire. Pt tolerated cephalosporin in 2015,    Medications:  Prior to Admission medications   Medication Sig Start Date End Date Taking? Authorizing Provider  acetaminophen (TYLENOL) 500 MG tablet Take 500 mg by mouth every 6 (six) hours as needed. For pain    Yes [provider]  albuterol (  PROVENTIL HFA;VENTOLIN HFA) 108 (90 Base) MCG/ACT inhaler Please dispense two if possible. For shortness of breath 08/28/18  Yes Mikey Kirschner, MD  azelastine (ASTELIN) 0.1 % nasal spray Place 2 sprays into both nostrils 2 (two) times daily. 09/19/19  Yes Mikey Kirschner, MD  buPROPion (WELLBUTRIN) 75 MG tablet TAKE (1) TABLET BY MOUTH ONCE DAILY. 01/23/20  Yes Kathrynn Ducking, MD  carbidopa-levodopa (SINEMET IR) 25-100 MG tablet TAKE 1/2 TABLET BY MOUTH 4 TIMES DAILY. 04/21/20  Yes Kathrynn Ducking, MD  Carbidopa-Levodopa ER (SINEMET CR) 25-100 MG tablet controlled release TAKE (1) TABLET BY  MOUTH (4) TIMES DAILY. 03/25/20  Yes Kathrynn Ducking, MD  cetirizine (ZYRTEC) 10 MG tablet Take 10 mg by mouth daily.   Yes [provider]  entacapone (COMTAN) 200 MG tablet TAKE (1) TABLET BY MOUTH (4) TIMES DAILY. Patient taking differently: Take 100 mg by mouth 4 (four) times daily.  04/21/20  Yes Kathrynn Ducking, MD  furosemide (LASIX) 20 MG tablet Take 20 mg by mouth. Take one up to three times a week for swelling   Yes [provider]    ROS:  Out of a complete 14 system review of symptoms, the patient complains only of the following symptoms, and all other reviewed systems are negative.  Walking difficulty, drooling Memory problems Hallucinations  Blood pressure 130/70, pulse 60, height 5\' 2"  (1.575 m), weight 112 lb (50.8 kg), SpO2 96 %.  Physical Exam  General: The patient is alert and cooperative at the time of the examination.  Skin: No significant peripheral edema is noted.   Neurologic Exam  Mental status: The patient is alert and oriented x 3 at the time of the examination. The patient has apparent normal recent and remote memory, with an apparently normal attention span and concentration ability.   Cranial nerves: Facial symmetry is present. Speech is normal, no aphasia or dysarthria is noted. Extraocular movements are full. Visual fields are full with exception that the patient has difficulty with full inferior gaze.  Masking of the face is seen.  Motor: The patient has good strength in all 4 extremities.  Sensory examination: Soft touch sensation is symmetric on the face, arms, and legs.  Coordination: The patient has good finger-nose-finger and heel-to-shin bilaterally.   Gait and station: The patient is able to arise from a seated position with arms crossed.  Once up, the patient is able to ambulate with a cane, she has shuffling the feet with initiation of gait and with turns.  The patient has a negative Romberg, tandem gait was not  attempted.  Reflexes: Deep tendon reflexes are symmetric.   Assessment/Plan:  1.  Parkinson's disease  2.  Mild memory disturbance  3.  Gait disturbance  4.  Hallucinations  The patient is having some problems with freezing at times.  She uses a cane for ambulation and has not been falling with this.  She is having ongoing problems with hallucinations, she will be taken off of the Comtan.  The Sinemet IR and CR dosing will remain the same.  I called in a small prescription for Seroquel will be given in the evening, 25 mg.  She will follow-up here in 5 months.  Jill Alexanders MD 05/05/2020 11:21 AM  [facility] [address] [place], [place] [address]  Phone [phone] Fax [phone]

## 2020-05-11 DIAGNOSIS — H524 Presbyopia: Secondary | ICD-10-CM | POA: Diagnosis not present

## 2020-05-11 DIAGNOSIS — H52201 Unspecified astigmatism, right eye: Secondary | ICD-10-CM | POA: Diagnosis not present

## 2020-05-11 DIAGNOSIS — H5213 Myopia, bilateral: Secondary | ICD-10-CM | POA: Diagnosis not present

## 2020-05-11 DIAGNOSIS — H353131 Nonexudative age-related macular degeneration, bilateral, early dry stage: Secondary | ICD-10-CM | POA: Diagnosis not present

## 2020-05-11 DIAGNOSIS — Z961 Presence of intraocular lens: Secondary | ICD-10-CM | POA: Diagnosis not present

## 2020-07-15 ENCOUNTER — Other Ambulatory Visit: Payer: Self-pay | Admitting: Neurology

## 2020-08-25 ENCOUNTER — Other Ambulatory Visit: Payer: Self-pay | Admitting: Neurology

## 2020-10-07 ENCOUNTER — Ambulatory Visit: Payer: PPO | Admitting: Neurology

## 2020-10-07 ENCOUNTER — Encounter: Payer: Self-pay | Admitting: Neurology

## 2020-10-07 VITALS — BP 172/75 | HR 65 | Ht 60.0 in | Wt 110.8 lb

## 2020-10-07 DIAGNOSIS — R443 Hallucinations, unspecified: Secondary | ICD-10-CM

## 2020-10-07 DIAGNOSIS — R413 Other amnesia: Secondary | ICD-10-CM

## 2020-10-07 DIAGNOSIS — R269 Unspecified abnormalities of gait and mobility: Secondary | ICD-10-CM | POA: Diagnosis not present

## 2020-10-07 DIAGNOSIS — G20 Parkinson's disease: Secondary | ICD-10-CM

## 2020-10-07 MED ORDER — BUPROPION HCL 75 MG PO TABS: 75.0000 mg | ORAL_TABLET | Freq: Every day | ORAL | 3 refills | Status: AC

## 2020-10-07 MED ORDER — SELEGILINE HCL 5 MG PO TABS: 5.0000 mg | ORAL_TABLET | Freq: Every day | ORAL | 3 refills | Status: DC

## 2020-10-07 MED ORDER — CARBIDOPA-LEVODOPA 25-100 MG PO TABS: ORAL_TABLET | ORAL | 1 refills | Status: DC

## 2020-10-07 MED ORDER — CARBIDOPA-LEVODOPA ER 25-100 MG PO TBCR: EXTENDED_RELEASE_TABLET | ORAL | 1 refills | Status: DC

## 2020-10-07 MED ORDER — ENTACAPONE 200 MG PO TABS: ORAL_TABLET | ORAL | 1 refills | Status: DC

## 2020-10-07 NOTE — Progress Notes (Signed)
Reason for visit: Parkinson's disease, memory disturbance, gait disturbance  Heather Gilmore is an 87 y.o. female  History of present illness:  Heather Gilmore is a an 87 year old right-handed white female with history of Parkinson's disease.  The patient currently is taking one half of a Sinemet 25/100 mg tablet with the first and second doses of the Sinemet CR 25/100 mg.  She is also on Comtan 200 mg with the first and second doses as well.  The patient went off of Comtan but had worsening of her ability to function.  She still has hallucinations that occur, at times that may upset her.  The patient tends to stay up late at night and sleep throughout the morning.  She did have 1 fall 2 weeks ago when she went to the bathroom without her cane.  She reports a lot of dizziness when she stands up, she still has a lot of freezing episodes that occur when initiating ambulation and with turns.  She developed an alteration in her ability to taste, everything tastes bitter or salty, food does not taste good to her.  She never had a clinical Covid infection however.  She returns for further evaluation.  Past Medical History:  Diagnosis Date  . Arthritis   . Asthma   . Dysphagia   . Essential tremor   . Gait abnormality 10/17/2017  . Hallucinations 12/26/2018  . 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 Hyperlipidemia   . Hypertension   . IBS (irritable bowel syndrome)   . Memory difficulty 10/17/2017  . Parkinson disease ([place])   . Rosacea   . Thyroid nodule     Past Surgical History:  Procedure Laterality Date  . APPENDECTOMY    . CATARACT EXTRACTION W/PHACO  05/03/2011   Procedure: CATARACT EXTRACTION PHACO AND INTRAOCULAR LENS PLACEMENT (IOC);  Surgeon: Elta Guadeloupe T. Gershon Crane;  Location: [facility];  Service: Ophthalmology;  Laterality: Right;  CDE: 7.52  . CATARACT EXTRACTION W/PHACO  05/17/2011   Procedure: CATARACT EXTRACTION PHACO AND INTRAOCULAR LENS PLACEMENT (IOC);  Surgeon: Elta Guadeloupe T. Gershon Crane;   Location: [facility];  Service: Ophthalmology;  Laterality: Left;  CDE 9.49  . CHOLECYSTECTOMY  6 yrs ago   aph-Dr Arnoldo Morale  . ESOPHAGEAL DILATION N/A 03/02/2016   Procedure: ESOPHAGEAL DILATION;  Surgeon: Rogene Houston, MD;  [facility];  Service: Endoscopy;  Laterality: N/A;  . ESOPHAGOGASTRODUODENOSCOPY N/A 03/02/2016   Procedure: ESOPHAGOGASTRODUODENOSCOPY (EGD);  Surgeon: Rogene Houston, MD;  [facility];  Service: Endoscopy;  Laterality: N/A;  1:25  . TONSILLECTOMY     as child    Family History  Problem Relation Age of Onset  . Anesthesia problems Neg Hx   . Hypotension Neg Hx   . Malignant hyperthermia Neg Hx   . Pseudochol deficiency Neg Hx     Social history:  reports that she quit smoking about 29 years ago. Her smoking use included cigarettes. She has a 10.00 pack-year smoking history. She has never used smokeless tobacco. She reports that she does not drink alcohol and does not use drugs.    Allergies  Allergen Reactions  . Dyazide [Hydrochlorothiazide W-Triamterene]   . Penicillins Other (See Comments)    Reaction: head felt on fire. Pt tolerated cephalosporin in 2015,    Medications:  Prior to Admission medications   Medication Sig Start Date End Date Taking? Authorizing Provider  acetaminophen (TYLENOL) 500 MG tablet Take 500 mg by mouth every 6 (  six) hours as needed. For pain   Yes [provider]  albuterol (PROVENTIL HFA;VENTOLIN HFA) 108 (90 Base) MCG/ACT inhaler Please dispense two if possible. For shortness of breath 08/28/18  Yes Mikey Kirschner, MD  azelastine (ASTELIN) 0.1 % nasal spray Place 2 sprays into both nostrils 2 (two) times daily. 09/19/19  Yes Mikey Kirschner, MD  buPROPion (WELLBUTRIN) 75 MG tablet TAKE (1) TABLET BY MOUTH ONCE DAILY. 07/15/20  Yes Kathrynn Ducking, MD  carbidopa-levodopa (SINEMET IR) 25-100 MG tablet TAKE 1/2 TABLET with the 1st and 2nd doses of the Sinemet CR 05/05/20  Yes Kathrynn Ducking, MD   Carbidopa-Levodopa ER (SINEMET CR) 25-100 MG tablet controlled release TAKE (1) TABLET BY MOUTH (4) TIMES DAILY. 05/05/20  Yes Kathrynn Ducking, MD  cetirizine (ZYRTEC) 10 MG tablet Take 10 mg by mouth daily.   Yes [provider]  entacapone (COMTAN) 200 MG tablet TAKE (1) TABLET BY MOUTH (4) TIMES DAILY. 08/25/20  Yes Kathrynn Ducking, MD  furosemide (LASIX) 20 MG tablet Take 20 mg by mouth. Take one up to three times a week for swelling   Yes [provider]  QUEtiapine (SEROQUEL) 25 MG tablet Take 1 tablet (25 mg total) by mouth at bedtime. 05/05/20  Yes Kathrynn Ducking, MD    ROS:  Out of a complete 14 system review of symptoms, the patient complains only of the following symptoms, and all other reviewed systems are negative.  Walking difficulty Memory problems Hallucinations Occasional choking  Blood pressure (!) 172/75, pulse 65, height 5' (1.524 m), weight 110 lb 12.8 oz (50.3 kg).   Blood pressure, right arm, sitting is 142/70.  Blood pressure, right arm, standing is 122/50.  Physical Exam  General: The patient is alert and cooperative at the time of the examination.  Skin: No significant peripheral edema is noted.   Neurologic Exam  Mental status: The patient is alert and oriented x 3 at the time of the examination. The patient has apparent normal recent and remote memory, with an apparently normal attention span and concentration ability.   Cranial nerves: Facial symmetry is present. Speech is normal, no aphasia or dysarthria is noted. Extraocular movements are full. Visual fields are full.  Some masking the face is seen.  Motor: The patient has good strength in all 4 extremities.  Sensory examination: Soft touch sensation is symmetric on the face, arms, and legs.  Coordination: The patient has good finger-nose-finger and heel-to-shin bilaterally.  The patient has dyskinesias affecting the head and neck and body.  Gait and station: The patient  has some difficulty arising from a seated position.  She walks with a cane, she does have some hesitancy with initiation of ambulation, and she shuffles with turns.  Tandem gait was not attempted.  Romberg is negative.  Reflexes: Deep tendon reflexes are symmetric.   Assessment/Plan:  1.  Parkinson's disease  2.  Mild memory disturbance  3.  Hallucinations  4.  Gait disturbance  The patient is having increasing frequency of freezing.  The patient will be placed on selegiline taking 5 mg daily.  She will continue her Sinemet dosing as above, and her Comtan.  A prescription was sent in for these medications and for the Wellbutrin.  She will follow up here in 5 months.  Jill Alexanders MD 10/07/2020 11:13 AM  [facility] [address] [place], [place] [address]  Phone [phone] Fax [phone]

## 2020-10-21 ENCOUNTER — Other Ambulatory Visit: Payer: Self-pay | Admitting: Family Medicine

## 2020-10-21 NOTE — Telephone Encounter (Signed)
Sent my chart message to schedule appointment.

## 2020-10-21 NOTE — Telephone Encounter (Signed)
Needs appt then route back to nurses

## 2020-11-04 NOTE — Telephone Encounter (Signed)
Appointment on 4/4 for medication followup

## 2020-11-09 ENCOUNTER — Encounter: Payer: Self-pay | Admitting: Family Medicine

## 2020-11-09 ENCOUNTER — Other Ambulatory Visit: Payer: Self-pay

## 2020-11-09 ENCOUNTER — Ambulatory Visit (INDEPENDENT_AMBULATORY_CARE_PROVIDER_SITE_OTHER): Payer: PPO | Admitting: Family Medicine

## 2020-11-09 VITALS — BP 181/81 | HR 64 | Temp 98.5°F | Wt 99.8 lb

## 2020-11-09 DIAGNOSIS — R5383 Other fatigue: Secondary | ICD-10-CM

## 2020-11-09 DIAGNOSIS — R10817 Generalized abdominal tenderness: Secondary | ICD-10-CM

## 2020-11-09 DIAGNOSIS — R079 Chest pain, unspecified: Secondary | ICD-10-CM

## 2020-11-09 DIAGNOSIS — R110 Nausea: Secondary | ICD-10-CM | POA: Diagnosis not present

## 2020-11-09 NOTE — Progress Notes (Addendum)
Patient ID: Heather Gilmore, female    DOB: [birth year], 87 y.o.   MRN: [id number]   No chief complaint on file.  Subjective:  CC: nausea with low appetite  This is a new problem.  Presents today with a complaint of nausea and not eating.  Present today with her son Heather Gilmore, and daughter Heather Gilmore is on the phone.  Has been falling more recently.  Does have a history of progressive Parkinson disease, sees Dr. Jannifer Gilmore every 6 months.  Reports that for a while she has had decreased appetite, eating very little, increased nausea and foods just do not taste good, worse in the last 2 weeks.  Reports that she was added a new medication about 1 month ago by Dr. Jannifer Gilmore, selegiline.  Has not had labs done since 2020.  Denies abdominal pain, denies vomiting, endorses nausea, chest pain, shortness of breath, low appetite, weight loss, and constipation. Reports she only has BM once per week.    pt arrives with son Heather Gilmore and daughter Heather Gilmore is on speaker phone.  Concerned about nausea and not eating. Going on for awhile but worse the past week.    Medical History Heather Gilmore has a past medical history of Arthritis, Asthma, Dysphagia, Essential tremor, Gait abnormality (10/17/2017), Hallucinations (12/26/2018), Heart murmur, Hyperlipidemia, Hypertension, IBS (irritable bowel syndrome), Memory difficulty (10/17/2017), Parkinson disease ([place]), Rosacea, and Thyroid nodule.   Outpatient Encounter Medications as of 11/09/2020  Medication Sig  . acetaminophen (TYLENOL) 500 MG tablet Take 500 mg by mouth every 6 (six) hours as needed. For pain  . albuterol (PROVENTIL HFA;VENTOLIN HFA) 108 (90 Base) MCG/ACT inhaler Please dispense two if possible. For shortness of breath  . azelastine (ASTELIN) 0.1 % nasal spray PLACE 2 SPRAYS IN BOTH NOSTRILS 2 TIMES DAILY.  Marland Kitchen buPROPion (WELLBUTRIN) 75 MG tablet Take 1 tablet (75 mg total) by mouth daily after breakfast.  . carbidopa-levodopa (SINEMET IR) 25-100 MG tablet TAKE 1/2 TABLET  with the 1st and 2nd doses of the Sinemet CR  . Carbidopa-Levodopa ER (SINEMET CR) 25-100 MG tablet controlled release TAKE (1) TABLET BY MOUTH (4) TIMES DAILY.  . cetirizine (ZYRTEC) 10 MG tablet Take 10 mg by mouth daily.  . entacapone (COMTAN) 200 MG tablet 1 tablet with the first and second doses of Sinemet  . selegiline (ELDEPRYL) 5 MG tablet Take 1 tablet (5 mg total) by mouth daily before breakfast.  . [DISCONTINUED] furosemide (LASIX) 20 MG tablet Take 20 mg by mouth. Take one up to three times a week for swelling   No facility-administered encounter medications on file as of 11/09/2020.     Review of Systems  Constitutional: Positive for fatigue. Negative for chills and fever.  HENT: Positive for rhinorrhea and sneezing.   Respiratory: Positive for shortness of breath.        With exertion  Cardiovascular: Positive for chest pain.  Gastrointestinal: Positive for nausea. Negative for abdominal pain and vomiting.  Neurological: Positive for dizziness and light-headedness.     Vitals BP (!) 181/81   Pulse 64   Temp 98.5 F (36.9 C)   Wt 99 lb 12.8 oz (45.3 kg)   SpO2 95%   BMI 19.49 kg/m   Objective:   Physical Exam Vitals reviewed.  Constitutional:      Appearance: Normal appearance.  Cardiovascular:     Rate and Rhythm: Normal rate and regular rhythm.     Heart sounds: Normal heart sounds.  Pulmonary:     Effort: Pulmonary effort is  normal.     Breath sounds: Normal breath sounds.  Abdominal:     Tenderness: There is abdominal tenderness.  Skin:    General: Skin is warm and dry.  Neurological:     General: No focal deficit present.     Mental Status: She is alert.  Psychiatric:        Behavior: Behavior normal.      Assessment and Plan   1. Chest pain, unspecified type - EKG 12-Lead  2. Nausea - Comprehensive Metabolic Panel (CMET) - Amylase - Lipase  3. Fatigue, unspecified type - CBC with Differential  4. Generalized abdominal tenderness,  rebound tenderness presence not specified - Amylase - Lipase   Symptoms include: Nausea, no appetite with weight loss Chest pain with activity Shortness of breath with activity Constipation Parkinson's on multiple medications, newest medication 1 month ago  EKG done, shows normal sinus rhythm, no ectopy, no evidence of ischemia, reviewed with son Heather Gilmore.  Will get labs today, follow-up will be determined once results are available.  Encouraged family to notify Dr. Jannifer Gilmore, to discussed side effects of the newest medication, as symptoms have worsened in the past 2 weeks since being on the new medication.  Unable to prescribe Zofran today due to increased risk of serotonin syndrome.  Encouraged bowel regimen, needs more frequent bowel movements.  Agrees with plan of care discussed today. Understands warning signs to seek further care: chest pain, shortness of breath, any significant change in health.  Understands to follow-up will be determined once lab results are available.      Pecolia Ades, NP 11/09/2020

## 2020-11-09 NOTE — Patient Instructions (Signed)
Nausea, Adult °Nausea is feeling sick to your stomach or feeling that you are about to throw up (vomit). Feeling sick to your stomach is usually not serious, but it may be an early sign of a more serious medical problem. °As you feel sicker to your stomach, you may throw up. If you throw up, or if you are not able to drink enough fluids, there is a risk that you may lose too much water in your body (get dehydrated). If you lose too much water in your body, you may: °· Feel tired. °· Feel thirsty. °· Have a dry mouth. °· Have cracked lips. °· Go pee (urinate) less often. °Older adults and people who have other diseases or a weak body defense system (immune system) have a higher risk of losing too much water in the body. °The main goals of treating this condition are: °· To relieve your nausea. °· To ensure your nausea occurs less often. °· To prevent throwing up and losing too much fluid. °Follow these instructions at home: °Watch your symptoms for any changes. Tell your doctor about them. Follow these instructions as told by your doctor. °Eating and drinking °· Take an ORS (oral rehydration solution). This is a drink that is sold at pharmacies and stores. °· Drink clear fluids in small amounts as you are able. These include: °? Water. °? Ice chips. °? Fruit juice that has water added (diluted fruit juice). °? Low-calorie sports drinks. °· Eat bland, easy-to-digest foods in small amounts as you are able, such as: °? Bananas. °? Applesauce. °? Rice. °? Low-fat (lean) meats. °? Toast. °? Crackers. °· Avoid drinking fluids that have a lot of sugar or caffeine in them. This includes energy drinks, sports drinks, and soda. °· Avoid alcohol. °· Avoid spicy or fatty foods.  °  °  °General instructions °· Take over-the-counter and prescription medicines only as told by your doctor. °· Rest at home while you get better. °· Drink enough fluid to keep your pee (urine) pale yellow. °· Take slow and deep breaths when you feel  sick to your stomach. °· Avoid food or things that have strong smells. °· Wash your hands often with soap and water. If you cannot use soap and water, use hand sanitizer. °· Make sure that all people in your home wash their hands well and often. °· Keep all follow-up visits as told by your doctor. This is important. °Contact a doctor if: °· You feel sicker to your stomach. °· You feel sick to your stomach for more than 2 days. °· You throw up. °· You are not able to drink fluids without throwing up. °· You have new symptoms. °· You have a fever. °· You have a headache. °· You have muscle cramps. °· You have a rash. °· You have pain while peeing. °· You feel light-headed or dizzy. °Get help right away if: °· You have pain in your chest, neck, arm, or jaw. °· You feel very weak or you pass out (faint). °· You have throw up that is bright red or looks like coffee grounds. °· You have bloody or black poop (stools) or poop that looks like tar. °· You have a very bad headache, a stiff neck, or both. °· You have very bad pain, cramping, or bloating in your belly (abdomen). °· You have trouble breathing or you are breathing very quickly. °· Your heart is beating very quickly. °· Your skin feels cold and clammy. °· You feel confused. °·   You have signs of losing too much water in your body, such as: °? Dark pee, very little pee, or no pee. °? Cracked lips. °? Dry mouth. °? Sunken eyes. °? Sleepiness. °? Weakness. °These symptoms may be an emergency. Do not wait to see if the symptoms will go away. Get medical help right away. Call your local emergency services (911 in the [place] Do not drive yourself to the hospital. °Summary °· Nausea is feeling sick to your stomach or feeling that you are about to throw up (vomit). °· If you throw up, or if you are not able to drink enough fluids, there is a risk that you may lose too much water in your body (get dehydrated). °· Eat and drink what your doctor tells you. Take  over-the-counter and prescription medicines only as told by your doctor. °· Contact a doctor right away if your symptoms get worse or you have new symptoms. °· Keep all follow-up visits as told by your doctor. This is important. °This information is not intended to replace advice given to you by your health care provider. Make sure you discuss any questions you have with your health care provider. °Document Revised: 06/25/2019 Document Reviewed: 01/02/2018 °Elsevier Patient Education © 2021 [employer] Inc[employer] ° °

## 2020-11-10 LAB — CBC WITH DIFFERENTIAL/PLATELET
Basophils Absolute: 0 10*3/uL (ref 0.0–0.2)
Basos: 0 %
EOS (ABSOLUTE): 0.1 10*3/uL (ref 0.0–0.4)
Eos: 1 %
Hematocrit: 41 % (ref 34.0–46.6)
Hemoglobin: 13.7 g/dL (ref 11.1–15.9)
Immature Grans (Abs): 0 10*3/uL (ref 0.0–0.1)
Immature Granulocytes: 0 %
Lymphocytes Absolute: 1.6 10*3/uL (ref 0.7–3.1)
Lymphs: 24 %
MCH: 29.7 pg (ref 26.6–33.0)
MCHC: 33.4 g/dL (ref 31.5–35.7)
MCV: 89 fL (ref 79–97)
Monocytes Absolute: 0.5 10*3/uL (ref 0.1–0.9)
Monocytes: 8 %
Neutrophils Absolute: 4.5 10*3/uL (ref 1.4–7.0)
Neutrophils: 67 %
Platelets: 279 10*3/uL (ref 150–450)
RBC: 4.61 x10E6/uL (ref 3.77–5.28)
RDW: 13.2 % (ref 11.7–15.4)
WBC: 6.7 10*3/uL (ref 3.4–10.8)

## 2020-11-10 LAB — COMPREHENSIVE METABOLIC PANEL
ALT: <5 IU/L (ref 0–32)
AST: 12 IU/L (ref 0–40)
Albumin/Globulin Ratio: 1.5 (ref 1.2–2.2)
Albumin: 4.1 g/dL (ref 3.6–4.6)
Alkaline Phosphatase: 111 IU/L (ref 44–121)
BUN/Creatinine Ratio: 21 (ref 12–28)
BUN: 19 mg/dL (ref 8–27)
Bilirubin Total: 0.5 mg/dL (ref 0.0–1.2)
CO2: 26 mmol/L (ref 20–29)
Calcium: 9.8 mg/dL (ref 8.7–10.3)
Chloride: 97 mmol/L (ref 96–106)
Creatinine, Ser: 0.92 mg/dL (ref 0.57–1.00)
Globulin, Total: 2.8 g/dL (ref 1.5–4.5)
Glucose: 105 mg/dL — ABNORMAL HIGH (ref 65–99)
Potassium: 4.2 mmol/L (ref 3.5–5.2)
Sodium: 137 mmol/L (ref 134–144)
Total Protein: 6.9 g/dL (ref 6.0–8.5)
eGFR: 60 mL/min/{1.73_m2} (ref >59)

## 2020-11-10 LAB — AMYLASE: Amylase: 33 U/L (ref 31–110)

## 2020-11-10 LAB — LIPASE: Lipase: 20 U/L (ref 14–85)

## 2020-11-19 ENCOUNTER — Other Ambulatory Visit: Payer: Self-pay | Admitting: Neurology

## 2020-11-19 MED ORDER — MIRTAZAPINE 7.5 MG PO TABS: 7.5000 mg | ORAL_TABLET | Freq: Every day | ORAL | 1 refills | Status: AC

## 2020-12-28 DIAGNOSIS — I739 Peripheral vascular disease, unspecified: Secondary | ICD-10-CM | POA: Diagnosis not present

## 2020-12-28 DIAGNOSIS — B351 Tinea unguium: Secondary | ICD-10-CM | POA: Diagnosis not present

## 2021-02-12 ENCOUNTER — Emergency Department (HOSPITAL_COMMUNITY)
Admission: EM | Admit: 2021-02-12 | Discharge: 2021-02-12 | Disposition: A | Discharge status: Home / Self Care | Payer: PPO | Attending: Emergency Medicine | Admitting: Emergency Medicine

## 2021-02-12 ENCOUNTER — Emergency Department (HOSPITAL_COMMUNITY): Payer: PPO

## 2021-02-12 ENCOUNTER — Other Ambulatory Visit: Payer: Self-pay

## 2021-02-12 ENCOUNTER — Other Ambulatory Visit: Payer: Self-pay | Admitting: Neurology

## 2021-02-12 ENCOUNTER — Encounter (HOSPITAL_COMMUNITY): Payer: Self-pay

## 2021-02-12 ENCOUNTER — Telehealth: Payer: Self-pay | Admitting: Family Medicine

## 2021-02-12 DIAGNOSIS — R0781 Pleurodynia: Secondary | ICD-10-CM | POA: Diagnosis not present

## 2021-02-12 DIAGNOSIS — M25551 Pain in right hip: Secondary | ICD-10-CM | POA: Insufficient documentation

## 2021-02-12 DIAGNOSIS — R531 Weakness: Secondary | ICD-10-CM | POA: Insufficient documentation

## 2021-02-12 DIAGNOSIS — R443 Hallucinations, unspecified: Secondary | ICD-10-CM | POA: Diagnosis not present

## 2021-02-12 DIAGNOSIS — R296 Repeated falls: Secondary | ICD-10-CM

## 2021-02-12 DIAGNOSIS — S40022A Contusion of left upper arm, initial encounter: Secondary | ICD-10-CM | POA: Insufficient documentation

## 2021-02-12 DIAGNOSIS — G20 Parkinson's disease: Secondary | ICD-10-CM | POA: Insufficient documentation

## 2021-02-12 DIAGNOSIS — S40021A Contusion of right upper arm, initial encounter: Secondary | ICD-10-CM | POA: Diagnosis not present

## 2021-02-12 DIAGNOSIS — Z87891 Personal history of nicotine dependence: Secondary | ICD-10-CM | POA: Insufficient documentation

## 2021-02-12 DIAGNOSIS — W19XXXA Unspecified fall, initial encounter: Secondary | ICD-10-CM | POA: Diagnosis not present

## 2021-02-12 DIAGNOSIS — I10 Essential (primary) hypertension: Secondary | ICD-10-CM | POA: Diagnosis not present

## 2021-02-12 DIAGNOSIS — S4991XA Unspecified injury of right shoulder and upper arm, initial encounter: Secondary | ICD-10-CM | POA: Diagnosis present

## 2021-02-12 DIAGNOSIS — J45909 Unspecified asthma, uncomplicated: Secondary | ICD-10-CM | POA: Insufficient documentation

## 2021-02-12 DIAGNOSIS — R079 Chest pain, unspecified: Secondary | ICD-10-CM | POA: Diagnosis not present

## 2021-02-12 DIAGNOSIS — R4182 Altered mental status, unspecified: Secondary | ICD-10-CM | POA: Diagnosis not present

## 2021-02-12 DIAGNOSIS — R269 Unspecified abnormalities of gait and mobility: Secondary | ICD-10-CM

## 2021-02-12 DIAGNOSIS — Z20822 Contact with and (suspected) exposure to covid-19: Secondary | ICD-10-CM | POA: Insufficient documentation

## 2021-02-12 DIAGNOSIS — R262 Difficulty in walking, not elsewhere classified: Secondary | ICD-10-CM

## 2021-02-12 DIAGNOSIS — M79641 Pain in right hand: Secondary | ICD-10-CM | POA: Diagnosis not present

## 2021-02-12 DIAGNOSIS — Y92009 Unspecified place in unspecified non-institutional (private) residence as the place of occurrence of the external cause: Secondary | ICD-10-CM | POA: Diagnosis not present

## 2021-02-12 DIAGNOSIS — R410 Disorientation, unspecified: Secondary | ICD-10-CM | POA: Diagnosis not present

## 2021-02-12 LAB — CBC WITH DIFFERENTIAL/PLATELET
Abs Immature Granulocytes: 0.04 10*3/uL (ref 0.00–0.07)
Basophils Absolute: 0 10*3/uL (ref 0.0–0.1)
Basophils Relative: 0 %
Eosinophils Absolute: 0 10*3/uL (ref 0.0–0.5)
Eosinophils Relative: 0 %
HCT: 35.2 % — ABNORMAL LOW (ref 36.0–46.0)
Hemoglobin: 11.9 g/dL — ABNORMAL LOW (ref 12.0–15.0)
Immature Granulocytes: 1 %
Lymphocytes Relative: 10 %
Lymphs Abs: 0.8 10*3/uL (ref 0.7–4.0)
MCH: 29.5 pg (ref 26.0–34.0)
MCHC: 33.8 g/dL (ref 30.0–36.0)
MCV: 87.1 fL (ref 80.0–100.0)
Monocytes Absolute: 0.5 10*3/uL (ref 0.1–1.0)
Monocytes Relative: 7 %
Neutro Abs: 6.5 10*3/uL (ref 1.7–7.7)
Neutrophils Relative %: 82 %
Platelets: 325 10*3/uL (ref 150–400)
RBC: 4.04 MIL/uL (ref 3.87–5.11)
RDW: 13.9 % (ref 11.5–15.5)
WBC: 7.8 10*3/uL (ref 4.0–10.5)
nRBC: 0 % (ref 0.0–0.2)

## 2021-02-12 LAB — RESP PANEL BY RT-PCR (FLU A&B, COVID) ARPGX2
Influenza A by PCR: NEGATIVE
Influenza B by PCR: NEGATIVE
SARS Coronavirus 2 by RT PCR: NEGATIVE

## 2021-02-12 LAB — URINALYSIS, ROUTINE W REFLEX MICROSCOPIC
Bilirubin Urine: NEGATIVE
Glucose, UA: 50 mg/dL — AB
Ketones, ur: 5 mg/dL — AB
Nitrite: NEGATIVE
Protein, ur: 30 mg/dL — AB
Specific Gravity, Urine: 1.016 (ref 1.005–1.030)
pH: 6 (ref 5.0–8.0)

## 2021-02-12 LAB — COMPREHENSIVE METABOLIC PANEL
ALT: <5 U/L (ref 0–44)
AST: 11 U/L — ABNORMAL LOW (ref 15–41)
Albumin: 3.5 g/dL (ref 3.5–5.0)
Alkaline Phosphatase: 73 U/L (ref 38–126)
Anion gap: 10 (ref 5–15)
BUN: 28 mg/dL — ABNORMAL HIGH (ref 8–23)
CO2: 28 mmol/L (ref 22–32)
Calcium: 9.2 mg/dL (ref 8.9–10.3)
Chloride: 94 mmol/L — ABNORMAL LOW (ref 98–111)
Creatinine, Ser: 1.02 mg/dL — ABNORMAL HIGH (ref 0.44–1.00)
GFR, Estimated: 53 mL/min — ABNORMAL LOW (ref >60)
Glucose, Bld: 103 mg/dL — ABNORMAL HIGH (ref 70–99)
Potassium: 3.4 mmol/L — ABNORMAL LOW (ref 3.5–5.1)
Sodium: 132 mmol/L — ABNORMAL LOW (ref 135–145)
Total Bilirubin: 1.1 mg/dL (ref 0.3–1.2)
Total Protein: 6.4 g/dL — ABNORMAL LOW (ref 6.5–8.1)

## 2021-02-12 LAB — CBG MONITORING, ED: Glucose-Capillary: 105 mg/dL — ABNORMAL HIGH (ref 70–99)

## 2021-02-12 MED ORDER — SODIUM CHLORIDE 0.9 % IV SOLN: Freq: Once | INTRAVENOUS | Status: AC

## 2021-02-12 MED ORDER — ACETAMINOPHEN 325 MG PO TABS
650.0000 mg | ORAL_TABLET | Freq: Once | ORAL | Status: AC
  Administered 2021-02-12: 650 mg via ORAL
  Filled 2021-02-12: qty 2

## 2021-02-12 NOTE — ED Provider Notes (Signed)
[facility] EMERGENCY DEPARTMENT Provider Note   CSN: [id number] Arrival date & time: 02/12/21  1553     History Chief Complaint  Patient presents with   Altered Mental Status    Heather Gilmore is a 87 y.o. female.  Pt presents to the ED today with frequent falls.  Pt has a hx of Parkinson's disease and has been falling a lot lately.  She has been evaluated by PT and her neurologist has just written her a Rx for a walker.  Pt c/o right rib pain and right hip pain.  Pt's family said she has been hallucinating.  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 Family said she has not been eating much.  Weight is down to 75 lbs.  She has no appetite and food does not taste good.      Past Medical History:  Diagnosis Date   Arthritis    Asthma    Dysphagia    Essential tremor    Gait abnormality 10/17/2017   Hallucinations 12/26/2018   Heart murmur    as child-had rhuematic fever   Hyperlipidemia    Hypertension    IBS (irritable bowel syndrome)    Memory difficulty 10/17/2017   Parkinson disease ([place])    Rosacea    Thyroid nodule     Patient Active Problem List   Diagnosis Date Noted   Chest pain 11/09/2020   Nausea 11/09/2020   Generalized abdominal tenderness 11/09/2020   Fatigue 11/09/2020   Dysphagia 06/03/2019   Unilateral primary osteoarthritis, right hip 03/21/2019   Hallucinations 12/26/2018   Gait abnormality 10/17/2017   Memory difficulty 10/17/2017   Urge incontinence 12/19/2013   Extrapyramidal dysarthria 06/04/2013   Parkinson's disease ([place]) 05/28/2013   Essential hypertension 06/09/2009   Asthma 06/09/2009   GERD 06/09/2009   SYNCOPE 06/09/2009   Abnormal involuntary movement 06/09/2009    Past Surgical History:  Procedure Laterality Date   APPENDECTOMY     CATARACT EXTRACTION W/PHACO  05/03/2011   Procedure: CATARACT EXTRACTION PHACO AND INTRAOCULAR LENS PLACEMENT ([place]);  Surgeon: Elta Guadeloupe T. Gershon Crane;  Location: [facility] OR[facility];  Service: Ophthalmology;  Laterality:  Right;  CDE: 7.52   CATARACT EXTRACTION W/PHACO  05/17/2011   Procedure: CATARACT EXTRACTION PHACO AND INTRAOCULAR LENS PLACEMENT ([facility]);  Surgeon: Elta Guadeloupe T. Gershon Crane;  Location: [facility];  Service: Ophthalmology;  Laterality: Left;  CDE 9.49   CHOLECYSTECTOMY  6 yrs ago   aph-Dr Arnoldo Morale   ESOPHAGEAL DILATION N/A 03/02/2016   Procedure: ESOPHAGEAL DILATION;  Surgeon: Rogene Houston, MD;  Location: [facility] ENDO SUITE;  Service: Endoscopy;  Laterality: N/A;   ESOPHAGOGASTRODUODENOSCOPY N/A 03/02/2016   Procedure: ESOPHAGOGASTRODUODENOSCOPY (EGD);  Surgeon: Rogene Houston, MD;  Location: [facility] SU[facility];  Service: Endoscopy;  Laterality: N/A;  1:25   TONSILLECTOMY     as child     OB History   No obstetric history on file.     Family History  Problem Relation Age of Onset   Anesthesia problems Neg Hx    Hypotension Neg Hx    Malignant hyperthermia Neg Hx    Pseudochol deficiency Neg Hx     Social History   Tobacco Use   Smoking status: Former    Packs/day: 1.00    Years: 10.00    Pack years: 10.00    Types: Cigarettes    Quit date: 04/28/1991    Years since quitting: 29.8   Smokeless tobacco: Never  Vaping Use   Vaping Use: Never used  Substance Use Topics   Alcohol use: No   Drug use: No    Home Medications Prior to Admission medications   Medication Sig Start Date End Date Taking? Authorizing Provider  acetaminophen (TYLENOL) 500 MG tablet Take 500 mg by mouth every 6 (six) hours as needed. For pain   Yes [provider]  albuterol (PROVENTIL HFA;VENTOLIN HFA) 108 (90 Base) MCG/ACT inhaler Please dispense two if possible. For shortness of breath 08/28/18  Yes Mikey Kirschner, MD  azelastine (ASTELIN) 0.1 % nasal spray PLACE 2 SPRAYS IN BOTH NOSTRILS 2 TIMES DAILY. Patient taking differently: Place 2 sprays into both nostrils 2 (two) times daily. 11/05/20  Yes Lovena Le, Malena M, DO  buPROPion (WELLBUTRIN) 75 MG tablet Take 1 tablet (75 mg total) by mouth daily after  breakfast. 10/07/20  Yes Kathrynn Ducking, MD  carbidopa-levodopa (SINEMET IR) 25-100 MG tablet TAKE 1/2 TABLET with the 1st and 2nd doses of the Sinemet CR 10/07/20  Yes Kathrynn Ducking, MD  Carbidopa-Levodopa ER (SINEMET CR) 25-100 MG tablet controlled release TAKE (1) TABLET BY MOUTH (4) TIMES DAILY. 10/07/20  Yes Kathrynn Ducking, MD  cetirizine (ZYRTEC) 10 MG tablet Take 10 mg by mouth daily.   Yes [provider]  entacapone (COMTAN) 200 MG tablet 1 tablet with the first and second doses of Sinemet 10/07/20  Yes Kathrynn Ducking, MD  multivitamin-lutein [facility]) CAPS capsule Take 1 capsule by mouth daily.   Yes [provider]  selegiline (ELDEPRYL) 5 MG tablet Take 1 tablet (5 mg total) by mouth daily before breakfast. 10/07/20  Yes Kathrynn Ducking, MD  mirtazapine (REMERON) 7.5 MG tablet Take 1 tablet (7.5 mg total) by mouth at bedtime. Patient not taking: Reported on 02/12/2021 11/19/20   Kathrynn Ducking, MD    Allergies    Dyazide [hydrochlorothiazide w-triamterene] and Penicillins  Review of Systems   Review of Systems  Constitutional:  Positive for appetite change.  Musculoskeletal:        Right hip and rib pain  Neurological:  Positive for weakness.  Psychiatric/Behavioral:  Positive for hallucinations.   All other systems reviewed and are negative.  Physical Exam Updated Vital Signs BP (!) 195/85   Pulse 68   Temp 97.9 F (36.6 C) (Oral)   Resp 17   Ht 5' (1.524 m)   Wt 34 kg   SpO2 98%   BMI 14.65 kg/m   Physical Exam Vitals and nursing note reviewed.  Constitutional:      Appearance: She is underweight.  HENT:     Head: Normocephalic and atraumatic.     Right Ear: External ear normal.     Left Ear: External ear normal.     Nose: Nose normal.     Mouth/Throat:     Mouth: Mucous membranes are moist.     Pharynx: Oropharynx is clear.  Eyes:     Extraocular Movements: Extraocular movements intact.     Conjunctiva/sclera: Conjunctivae  normal.     Pupils: Pupils are equal, round, and reactive to light.  Cardiovascular:     Rate and Rhythm: Normal rate and regular rhythm.     Pulses: Normal pulses.     Heart sounds: Normal heart sounds.  Pulmonary:     Effort: Pulmonary effort is normal.     Breath sounds: Normal breath sounds.  Chest:     Comments: Tenderness to palpation right lower chest wall Abdominal:     General: Abdomen is flat. Bowel sounds  are normal.     Palpations: Abdomen is soft.  Musculoskeletal:     Cervical back: Normal range of motion and neck supple.     Comments: Right hip tenderness, but able to stand  Skin:    Capillary Refill: Capillary refill takes less than 2 seconds.     Comments: Multiple bruises on arms  Neurological:     Mental Status: She is alert and oriented to person, place, and time.     Motor: Tremor present.  Psychiatric:        Mood and Affect: Mood normal.    ED Results / Procedures / Treatments   Labs (all labs ordered are listed, but only abnormal results are displayed) Labs Reviewed  CBC WITH DIFFERENTIAL/PLATELET - Abnormal; Notable for the following components:      Result Value   Hemoglobin 11.9 (*)    HCT 35.2 (*)    All other components within normal limits  COMPREHENSIVE METABOLIC PANEL - Abnormal; Notable for the following components:   Sodium 132 (*)    Potassium 3.4 (*)    Chloride 94 (*)    Glucose, Bld 103 (*)    BUN 28 (*)    Creatinine, Ser 1.02 (*)    Total Protein 6.4 (*)    AST 11 (*)    GFR, Estimated 53 (*)    All other components within normal limits  URINALYSIS, ROUTINE W REFLEX MICROSCOPIC - Abnormal; Notable for the following components:   Color, Urine AMBER (*)    APPearance HAZY (*)    Glucose, UA 50 (*)    Hgb urine dipstick SMALL (*)    Ketones, ur 5 (*)    Protein, ur 30 (*)    Leukocytes,Ua MODERATE (*)    Bacteria, UA RARE (*)    All other components within normal limits  CBG MONITORING, ED - Abnormal; Notable for the  following components:   Glucose-Capillary 105 (*)    All other components within normal limits  RESP PANEL BY RT-PCR (FLU A&B, COVID) [id number]    EKG EKG Interpretation  Date/Time:  Friday February 12 2021 16:10:27 EDT Ventricular Rate:  67 PR Interval:  190 QRS Duration: 93 QT Interval:  410 QTC Calculation: 433 R Axis:   19 Text Interpretation: Sinus rhythm Consider left ventricular hypertrophy Anterior Q waves, possibly due to LVH No significant change since last tracing Confirmed by Isla Pence [phone]) on 02/12/2021 4:27:33 PM  Radiology DG Ribs Unilateral W/Chest Right  Result Date: 02/12/2021 CLINICAL DATA:  Right chest pain.  Multiple recent falls. EXAM: RIGHT RIBS AND CHEST - 3+ VIEW COMPARISON:  PA and lateral chest 02/03/2020. FINDINGS: The lungs are clear. Heart size is normal. No pneumothorax or pleural fluid. No fracture or focal bony abnormality. Nipple shadow on the right noted. IMPRESSION: Negative for fracture.  No acute disease. Electronically Signed   By: Inge Rise M.D.   On: 02/12/2021 18:47   CT Head Wo Contrast  Result Date: 02/12/2021 CLINICAL DATA:  Altered mental status today. EXAM: CT HEAD WITHOUT CONTRAST TECHNIQUE: Contiguous axial images were obtained from the base of the skull through the vertex without intravenous contrast. COMPARISON:  Head CT 05/06/2016. FINDINGS: Brain: No evidence of acute infarction, hemorrhage, hydrocephalus, extra-axial collection or mass lesion/mass effect. Atrophy and chronic microvascular ischemic change noted. Vascular: No hyperdense vessel or unexpected calcification. Skull: Intact.  No focal lesion. Sinuses/Orbits: Status post cataract surgery.  Otherwise negative. Other: None. IMPRESSION: No acute abnormality. Atrophy and chronic microvascular  ischemic change. Electronically Signed   By: Inge Rise M.D.   On: 02/12/2021 18:44   DG Hand Complete Right  Result Date: 02/12/2021 CLINICAL DATA:  Right hand pain.  Multiple  recent falls. EXAM: RIGHT HAND - COMPLETE 3+ VIEW COMPARISON:  None. FINDINGS: No acute bony or joint abnormality is identified. Scattered osteoarthritis is present about interphalangeal joints, second and third MCP joints, the first [place] joint and scaphoid trapezium trapezoid joint. Soft tissues are negative. IMPRESSION: No acute abnormality. Scattered osteoarthritis. Electronically Signed   By: Inge Rise M.D.   On: 02/12/2021 18:49   DG Hip Unilat W or Wo Pelvis 2-3 Views Right  Result Date: 02/12/2021 CLINICAL DATA:  Multiple falls.  Right hip pain. EXAM: DG HIP (WITH OR WITHOUT PELVIS) 2-3V RIGHT COMPARISON:  Plain films right hip 02/18/2020. FINDINGS: No acute bony or joint abnormality. No focal bony lesion. Right much worse than left hip osteoarthritis appears unchanged. Soft tissues are negative. IMPRESSION: No acute abnormality. Right worse than left hip osteoarthritis appears unchanged. Electronically Signed   By: Inge Rise M.D.   On: 02/12/2021 18:48    Procedures Procedures   Medications Ordered in ED Medications  acetaminophen (TYLENOL) tablet 650 mg (650 mg Oral Given 02/12/21 1631)  0.9 %  sodium chloride infusion ( Intravenous Stopped 02/12/21 2034)    ED Course  I have reviewed the triage vital signs and the nursing notes.  Pertinent labs & imaging results that were available during my care of the patient were reviewed by me and considered in my medical decision making (see chart for details).    MDM Rules/Calculators/A&P                          There is nothing acute going on today.  Her son said she has an aide during the day and then he and his sister take turns at night.  She has not wanted to go to an ALF, but they have made a deposit on a facility.  They are actively trying to get her there.  She does have good family support.  Pt is stable for d/c.  Return if worse. Final Clinical Impression(s) / ED Diagnoses Final diagnoses:  Ambulatory dysfunction   Frequent falls  Parkinson's disease [facility])    Rx / DC Orders ED Discharge Orders     None        Isla Pence, MD 02/12/21 2045

## 2021-02-12 NOTE — Telephone Encounter (Signed)
Daughter(Trish) stating patient has eaten in a week ,eating small bites,very weak, down to 65 lbs. Please advise

## 2021-02-12 NOTE — ED Notes (Signed)
Pt denies need to urinate at this time. 

## 2021-02-12 NOTE — ED Notes (Signed)
Pt taken to radiology

## 2021-02-12 NOTE — ED Triage Notes (Signed)
Pt presents to ED with son with complaints of AMS. Son states she has been hallucinating and confusion, had multiple falls at home, unsure if she has hit her head or LOC. Pt with history of parkinson's. Pt c/o headache and right hip pain.

## 2021-02-12 NOTE — Telephone Encounter (Signed)
Spoke with Wannetta Sender, daughter to inform her patient will need evaluation from either UC or ER due lack of appetite and not drinking x 1 week, weight loss. Informed call office back to schedule an doctors appt to follow up with primary care.

## 2021-02-18 ENCOUNTER — Telehealth: Payer: Self-pay | Admitting: *Deleted

## 2021-02-18 NOTE — Telephone Encounter (Signed)
Received fax from [facility] re: FL2 and addendum. Family is pursuing potential placement there. Placed in MD inbox for completion.

## 2021-02-22 NOTE — Telephone Encounter (Signed)
FL2 form from [place] completed by Dr Jannifer Franklin and faxed to [place], [place]. Received confirmation.  Sent to medical records.

## 2021-02-24 ENCOUNTER — Telehealth: Payer: Self-pay | Admitting: Neurology

## 2021-02-24 NOTE — Telephone Encounter (Signed)
Heather Gilmore from Hospice care needing Dr. Jannifer Franklin to sign off on pt receiving hospice services. Vito Backers is requesting a call back at [phone] ext [phone].

## 2021-02-24 NOTE — Telephone Encounter (Signed)
Heather Gilmore called for VO for Hospice Evaluation/start of care.  Pt is in between PCP's at this time and pcp will not establish care until August.  VO provided in interim and Vito Backers has been advised order for Hospice care may have to be transferred to new PCP ( Dr. [place] Crane) since Dr. Jannifer Franklin retirement is coming  She verbalized understanding and appreciation for the call.

## 2021-02-24 NOTE — Telephone Encounter (Signed)
I called the number back and left a vm on Cassandra's vm asking her to fax the order for Dr. Jannifer Franklin to review and provide signature for. Advised to send fax to # [phone].  Advised Cassandra to call our office back if she had any questions/concerns.

## 2021-03-01 NOTE — Telephone Encounter (Signed)
Received Hospice orders for Dr Jannifer Franklin to review, sign. He will be back in office next Monday. Orders placed in his in box.

## 2021-03-08 NOTE — Telephone Encounter (Signed)
Received all three faxes back that were faxed over this morning. No instructions or reason for return of faxes given. [facility], spoke with  Admin and was given a new fax # [phone]. All three faxed to new #.

## 2021-03-08 NOTE — Telephone Encounter (Signed)
Hospice plan of care signed, Hospice standing orders completed and signed, Hospice physician's certification of terminal illness completed and signed. All three forms faxed to [facility].

## 2021-03-11 ENCOUNTER — Encounter: Payer: Self-pay | Admitting: Neurology

## 2021-03-11 ENCOUNTER — Other Ambulatory Visit: Payer: Self-pay

## 2021-03-11 ENCOUNTER — Ambulatory Visit: Payer: PPO | Admitting: Neurology

## 2021-03-11 VITALS — BP 149/72 | HR 68 | Ht 60.0 in | Wt 75.2 lb

## 2021-03-11 DIAGNOSIS — R269 Unspecified abnormalities of gait and mobility: Secondary | ICD-10-CM | POA: Diagnosis not present

## 2021-03-11 DIAGNOSIS — R1312 Dysphagia, oropharyngeal phase: Secondary | ICD-10-CM | POA: Diagnosis not present

## 2021-03-11 DIAGNOSIS — R443 Hallucinations, unspecified: Secondary | ICD-10-CM

## 2021-03-11 DIAGNOSIS — G20 Parkinson's disease: Secondary | ICD-10-CM

## 2021-03-11 DIAGNOSIS — G20A1 Parkinson's disease without dyskinesia, without mention of fluctuations: Secondary | ICD-10-CM

## 2021-03-11 MED ORDER — CARBIDOPA-LEVODOPA 25-100 MG PO TBDP: 1.0000 | ORAL_TABLET | Freq: Four times a day (QID) | ORAL | 1 refills | Status: AC

## 2021-03-11 NOTE — Patient Instructions (Signed)
We will switch to parcopa 25/100 mg tablet taking one 4 times a day, stop the Sinemet 25/100 and the sinemet CR tablets.

## 2021-03-11 NOTE — Progress Notes (Signed)
Reason for visit: Parkinson's disease, dementia, hallucinations, gait disorder  Heather Gilmore is an 87 y.o. female  History of present illness:  Heather Gilmore is an 87 year old right-handed white female with a history of Parkinson's disease.  The patient has had a significant gait disturbance with multiple falls.  She was seen in the emergency room on 12 February 2021 with several falls, she has had failure to thrive with a 30 pound weight loss.  She indicates that food tastes bad, she will hold food in her mouth, she has trouble swallowing.  She continues to have some confusion particularly in the evenings with hallucinations.  She sees children running about the house.  She may get agitated at times.  The use of Eldepryl increase the agitation, this was stopped.  The patient is now off of Seroquel and Ativan.  She is being followed by hospice in the home environment.  The patient takes hydrocodone on occasion for some left flank and shoulder pain.  Her last fall was 5 or 6 days ago.  The patient tries to get up and walk on her own and will fall.  She has a wheelchair at home to use.  The daughter indicates that she will hold food in the mouth, again she has difficulty swallowing pills and food.  Past Medical History:  Diagnosis Date   Arthritis    Asthma    Dysphagia    Essential tremor    Gait abnormality 10/17/2017   Hallucinations 12/26/2018   Heart murmur    as child-had rhuematic fever   Hyperlipidemia    Hypertension    IBS (irritable bowel syndrome)    Memory difficulty 10/17/2017   Parkinson disease ([place])    Rosacea    Thyroid nodule     Past Surgical History:  Procedure Laterality Date   APPENDECTOMY     CATARACT EXTRACTION W/PHACO  05/03/2011   Procedure: CATARACT EXTRACTION PHACO AND INTRAOCULAR LENS PLACEMENT ([place]);  Surgeon: Elta Guadeloupe T. Gershon Crane;  Location: [facility];  Service: Ophthalmology;  Laterality: Right;  CDE: 7.52   CATARACT EXTRACTION W/PHACO  05/17/2011   Procedure: CATARACT  EXTRACTION PHACO AND INTRAOCULAR LENS PLACEMENT (IOC);  Surgeon: Elta Guadeloupe T. Gershon Crane;  Location: [facility];  Service: Ophthalmology;  Laterality: Left;  CDE 9.49   CHOLECYSTECTOMY  6 yrs ago   aph-Dr Arnoldo Morale   ESOPHAGEAL DILATION N/A 03/02/2016   Procedure: ESOPHAGEAL DILATION;  Surgeon: Rogene Houston, MD;  Location: [facility];  Service: Endoscopy;  Laterality: N/A;   ESOPHAGOGASTRODUODENOSCOPY N/A 03/02/2016   Procedure: ESOPHAGOGASTRODUODENOSCOPY (EGD);  Surgeon: Rogene Houston, MD;  Location: [facility];  Service: Endoscopy;  Laterality: N/A;  1:25   TONSILLECTOMY     as child    Family History  Problem Relation Age of Onset   Anesthesia problems Neg Hx    Hypotension Neg Hx    Malignant hyperthermia Neg Hx    Pseudochol deficiency Neg Hx     Social history:  reports that she quit smoking about 29 years ago. Her smoking use included cigarettes. She has a 10.00 pack-year smoking history. She has never used smokeless tobacco. She reports that she does not drink alcohol and does not use drugs.    Allergies  Allergen Reactions   Dyazide [Hydrochlorothiazide W-Triamterene]    Penicillins Other (See Comments)    Reaction: head felt on fire. Pt tolerated cephalosporin in 2015,    Medications:  Prior to Admission medications   Medication Sig Start Date  End Date Taking? Authorizing Provider  acetaminophen (TYLENOL) 500 MG tablet Take 500 mg by mouth every 6 (six) hours as needed. For pain    [provider]  albuterol (PROVENTIL HFA;VENTOLIN HFA) 108 (90 Base) MCG/ACT inhaler Please dispense two if possible. For shortness of breath 08/28/18   Mikey Kirschner, MD  azelastine (ASTELIN) 0.1 % nasal spray PLACE 2 SPRAYS IN BOTH NOSTRILS 2 TIMES DAILY. Patient taking differently: Place 2 sprays into both nostrils 2 (two) times daily. 11/05/20   Erven Colla, DO  buPROPion (WELLBUTRIN) 75 MG tablet Take 1 tablet (75 mg total) by mouth daily after breakfast. 10/07/20   Kathrynn Ducking, MD  carbidopa-levodopa (SINEMET IR) 25-100 MG tablet TAKE 1/2 TABLET with the 1st and 2nd doses of the Sinemet CR 10/07/20   Kathrynn Ducking, MD  Carbidopa-Levodopa ER (SINEMET CR) 25-100 MG tablet controlled release TAKE (1) TABLET BY MOUTH (4) TIMES DAILY. 10/07/20   Kathrynn Ducking, MD  cetirizine (ZYRTEC) 10 MG tablet Take 10 mg by mouth daily.    [provider]  entacapone (COMTAN) 200 MG tablet 1 tablet with the first and second doses of Sinemet 10/07/20   Kathrynn Ducking, MD  mirtazapine (REMERON) 7.5 MG tablet Take 1 tablet (7.5 mg total) by mouth at bedtime. Patient not taking: Reported on 02/12/2021 11/19/20   Kathrynn Ducking, MD  multivitamin-lutein [facility]) CAPS capsule Take 1 capsule by mouth daily.    [provider]  selegiline (ELDEPRYL) 5 MG tablet Take 1 tablet (5 mg total) by mouth daily before breakfast. 10/07/20   Kathrynn Ducking, MD    ROS:  Out of a complete 14 system review of symptoms, the patient complains only of the following symptoms, and all other reviewed systems are negative.  Walking difficulty Confusion Weight loss Hallucinations  Blood pressure (!) 149/72, pulse 68, height 5' (1.524 m), weight 75 lb 3.2 oz (34.1 kg).  Physical Exam  General: The patient is alert and cooperative at the time of the examination.  Skin: No significant peripheral edema is noted.   Neurologic Exam  Mental status: The patient is alert and oriented x 3 at the time of the examination.  The patient will cooperate, she will follow commands readily.   Cranial nerves: Facial symmetry is present. Speech is normal, no aphasia or dysarthria is noted. Extraocular movements are full. Visual fields are full.  Masking the face is seen.  Motor: The patient has good strength in all 4 extremities.  Sensory examination: Soft touch sensation is symmetric on the face, arms, and legs.  Coordination: The patient has good finger-nose-finger and  heel-to-shin bilaterally.  Gait and station: The patient has the ability to stand with some assistance.  She can walk a short distance with examiner, takes short shuffling steps, has difficulty with turns.  Has some tendency to lean backwards.  Reflexes: Deep tendon reflexes are symmetric.   CT head 02/12/21:  IMPRESSION: No acute abnormality.   Atrophy and chronic microvascular ischemic change.   * CT scan images were reviewed online. I agree with the written report.    Assessment/Plan:  1.  Parkinson's disease  2.  Gait disturbance  3.  Memory disturbance, confusion, hallucinations  4.  Dysphagia  5.  Failure to thrive  The patient is not doing well currently.  She is being followed through hospice.  We will stop her Sinemet and convert to [place] which should be easier to swallow.  The patient  will be set up for swallowing evaluation.  The patient will start mirtazapine at night for sleep, weight gain, and to help agitation.  She will follow-up here in 4 months, she may be followed by Dr. Rexene Alberts in the future.  Jill Alexanders MD 03/11/2021 12:09 PM  [facility] [address] [place], [place] [address]  Phone [phone] Fax [phone]

## 2021-03-16 NOTE — Telephone Encounter (Signed)
Received Hospice paperwork re: need more in narrative.  Papers placed in Dr Jannifer Franklin' inbox for completion next week when he returns to office.

## 2021-03-17 DIAGNOSIS — M25551 Pain in right hip: Secondary | ICD-10-CM | POA: Diagnosis not present

## 2021-03-17 DIAGNOSIS — R636 Underweight: Secondary | ICD-10-CM | POA: Diagnosis not present

## 2021-03-17 DIAGNOSIS — G20 Parkinson's disease: Secondary | ICD-10-CM | POA: Diagnosis not present

## 2021-03-17 DIAGNOSIS — R451 Restlessness and agitation: Secondary | ICD-10-CM | POA: Diagnosis not present

## 2021-03-17 DIAGNOSIS — M25552 Pain in left hip: Secondary | ICD-10-CM | POA: Diagnosis not present

## 2021-03-17 DIAGNOSIS — F4489 Other dissociative and conversion disorders: Secondary | ICD-10-CM | POA: Diagnosis not present

## 2021-03-17 DIAGNOSIS — Z0000 Encounter for general adult medical examination without abnormal findings: Secondary | ICD-10-CM | POA: Diagnosis not present

## 2021-03-17 DIAGNOSIS — R296 Repeated falls: Secondary | ICD-10-CM | POA: Diagnosis not present

## 2021-03-24 NOTE — Telephone Encounter (Signed)
The new form was filled out.

## 2021-03-25 ENCOUNTER — Telehealth: Payer: Self-pay

## 2021-03-25 NOTE — Telephone Encounter (Signed)
Hospice Narrative form has been completed and signed by Dr. Jannifer Franklin  Order has been sent to # [phone] confirmation.

## 2021-03-25 NOTE — Telephone Encounter (Signed)
Received vo paper for Dr. Jannifer Franklin to sign off on. VO:D/c Seroquel 25 mg and start Zofran 4 mg PO PRN.  Order signed and faxed to # [phone]. Confirmation received.

## 2021-03-29 ENCOUNTER — Emergency Department (HOSPITAL_COMMUNITY)
Admission: EM | Admit: 2021-03-29 | Discharge: 2021-03-29 | Disposition: A | Discharge status: Home / Self Care | Payer: Medicare Other | Attending: Emergency Medicine | Admitting: Emergency Medicine

## 2021-03-29 ENCOUNTER — Other Ambulatory Visit: Payer: Self-pay

## 2021-03-29 ENCOUNTER — Encounter (HOSPITAL_COMMUNITY): Payer: Self-pay | Admitting: *Deleted

## 2021-03-29 ENCOUNTER — Emergency Department (HOSPITAL_COMMUNITY): Payer: Medicare Other

## 2021-03-29 DIAGNOSIS — K5730 Diverticulosis of large intestine without perforation or abscess without bleeding: Secondary | ICD-10-CM | POA: Diagnosis not present

## 2021-03-29 DIAGNOSIS — Z79899 Other long term (current) drug therapy: Secondary | ICD-10-CM | POA: Diagnosis not present

## 2021-03-29 DIAGNOSIS — K5792 Diverticulitis of intestine, part unspecified, without perforation or abscess without bleeding: Secondary | ICD-10-CM

## 2021-03-29 DIAGNOSIS — R1032 Left lower quadrant pain: Secondary | ICD-10-CM | POA: Insufficient documentation

## 2021-03-29 DIAGNOSIS — K6389 Other specified diseases of intestine: Secondary | ICD-10-CM | POA: Diagnosis not present

## 2021-03-29 DIAGNOSIS — G20 Parkinson's disease: Secondary | ICD-10-CM | POA: Insufficient documentation

## 2021-03-29 DIAGNOSIS — F0280 Dementia in other diseases classified elsewhere without behavioral disturbance: Secondary | ICD-10-CM | POA: Insufficient documentation

## 2021-03-29 DIAGNOSIS — J45909 Unspecified asthma, uncomplicated: Secondary | ICD-10-CM | POA: Diagnosis not present

## 2021-03-29 DIAGNOSIS — I10 Essential (primary) hypertension: Secondary | ICD-10-CM | POA: Diagnosis not present

## 2021-03-29 DIAGNOSIS — Z87891 Personal history of nicotine dependence: Secondary | ICD-10-CM | POA: Diagnosis not present

## 2021-03-29 DIAGNOSIS — K529 Noninfective gastroenteritis and colitis, unspecified: Secondary | ICD-10-CM | POA: Diagnosis not present

## 2021-03-29 DIAGNOSIS — K5900 Constipation, unspecified: Secondary | ICD-10-CM | POA: Diagnosis not present

## 2021-03-29 DIAGNOSIS — N200 Calculus of kidney: Secondary | ICD-10-CM | POA: Diagnosis not present

## 2021-03-29 DIAGNOSIS — R109 Unspecified abdominal pain: Secondary | ICD-10-CM | POA: Diagnosis present

## 2021-03-29 LAB — CBC WITH DIFFERENTIAL/PLATELET
Abs Immature Granulocytes: 0.03 10*3/uL (ref 0.00–0.07)
Basophils Absolute: 0 10*3/uL (ref 0.0–0.1)
Basophils Relative: 1 %
Eosinophils Absolute: 0 10*3/uL (ref 0.0–0.5)
Eosinophils Relative: 0 %
HCT: 31.8 % — ABNORMAL LOW (ref 36.0–46.0)
Hemoglobin: 10.6 g/dL — ABNORMAL LOW (ref 12.0–15.0)
Immature Granulocytes: 0 %
Lymphocytes Relative: 12 %
Lymphs Abs: 0.9 10*3/uL (ref 0.7–4.0)
MCH: 30.6 pg (ref 26.0–34.0)
MCHC: 33.3 g/dL (ref 30.0–36.0)
MCV: 91.9 fL (ref 80.0–100.0)
Monocytes Absolute: 0.5 10*3/uL (ref 0.1–1.0)
Monocytes Relative: 7 %
Neutro Abs: 6.1 10*3/uL (ref 1.7–7.7)
Neutrophils Relative %: 80 %
Platelets: 354 10*3/uL (ref 150–400)
RBC: 3.46 MIL/uL — ABNORMAL LOW (ref 3.87–5.11)
RDW: 14.6 % (ref 11.5–15.5)
WBC: 7.6 10*3/uL (ref 4.0–10.5)
nRBC: 0 % (ref 0.0–0.2)

## 2021-03-29 LAB — COMPREHENSIVE METABOLIC PANEL
ALT: <5 U/L (ref 0–44)
AST: 7 U/L — ABNORMAL LOW (ref 15–41)
Albumin: 3 g/dL — ABNORMAL LOW (ref 3.5–5.0)
Alkaline Phosphatase: 113 U/L (ref 38–126)
Anion gap: 5 (ref 5–15)
BUN: 14 mg/dL (ref 8–23)
CO2: 27 mmol/L (ref 22–32)
Calcium: 8.5 mg/dL — ABNORMAL LOW (ref 8.9–10.3)
Chloride: 103 mmol/L (ref 98–111)
Creatinine, Ser: 0.82 mg/dL (ref 0.44–1.00)
GFR, Estimated: >60 mL/min (ref >60)
Glucose, Bld: 81 mg/dL (ref 70–99)
Potassium: 3 mmol/L — ABNORMAL LOW (ref 3.5–5.1)
Sodium: 135 mmol/L (ref 135–145)
Total Bilirubin: 0.8 mg/dL (ref 0.3–1.2)
Total Protein: 5.9 g/dL — ABNORMAL LOW (ref 6.5–8.1)

## 2021-03-29 LAB — POC OCCULT BLOOD, ED: Fecal Occult Bld: NEGATIVE

## 2021-03-29 MED ORDER — CIPROFLOXACIN HCL 250 MG PO TABS
500.0000 mg | ORAL_TABLET | Freq: Once | ORAL | Status: AC
  Administered 2021-03-29: 500 mg via ORAL
  Filled 2021-03-29: qty 2

## 2021-03-29 MED ORDER — CIPROFLOXACIN HCL 500 MG PO TABS: 500.0000 mg | ORAL_TABLET | Freq: Two times a day (BID) | ORAL | 0 refills | Status: AC

## 2021-03-29 MED ORDER — METRONIDAZOLE 500 MG PO TABS
500.0000 mg | ORAL_TABLET | Freq: Once | ORAL | Status: AC
  Administered 2021-03-29: 500 mg via ORAL
  Filled 2021-03-29: qty 1

## 2021-03-29 MED ORDER — SODIUM CHLORIDE 0.9 % IV BOLUS
500.0000 mL | Freq: Once | INTRAVENOUS | Status: AC
  Administered 2021-03-29: 500 mL via INTRAVENOUS

## 2021-03-29 MED ORDER — LORAZEPAM 2 MG/ML IJ SOLN
0.2500 mg | Freq: Once | INTRAMUSCULAR | Status: AC
  Administered 2021-03-29: 0.25 mg via INTRAVENOUS
  Filled 2021-03-29: qty 1

## 2021-03-29 MED ORDER — METRONIDAZOLE 500 MG PO TABS: 500.0000 mg | ORAL_TABLET | Freq: Two times a day (BID) | ORAL | 0 refills | Status: AC

## 2021-03-29 NOTE — Discharge Instructions (Addendum)
Imaging reveals that you have diverticulitis of starting on antibiotics please take as prescribed.  Ciprofloxacin can increase her risk of rupturing  a tendon I do not recommend high intensity activities for the next 2 weeks.  Also on Flagyl this reaction probably with alcohol should not drink alcohol while taking this medication.  I recommend a liquid diet for the first couple days, then migrating to a soft diet fractional days and then finally she can go to eating normal food.  also noted that patient had some fluid in her pelvic region this seems to be benign I would like her to follow-up with her PCP for further evaluation.  Please come back to the emergency department if she develops fevers, severe abdominal pain, her stomach becomes distended, she is unable to pass gas or have bowel movements.

## 2021-03-29 NOTE — ED Notes (Signed)
Patient transported to CT 

## 2021-03-29 NOTE — ED Provider Notes (Signed)
Patient was received at at shift change from [facility] please see her note for full detail.  Initial impression-patient with significant medical history of asthma, hyperlipidemia, hypertension, Parkinson's disease, dementia lives with 24/7 nursing care presents emerged part chief complaint of abdominal pain started yesterday, pain seems to wax and wanes in intensity, no associated nausea or vomiting, no fevers or chills, no other complaints at this time.  Per previous provider follow-up on CT abdomen pelvis treat accordingly.  Work-up-CBC shows normocytic anemia hemoglobin 10.6, CMP shows slight hypokalemia 3.0, Hemoccult negative, chest x-ray negative for acute findings.  CT imaging reveals extensive diverticulosis with mild inflammatory changes surrounding a loop of large bowel in the hemipelvic suggesting mild acute uncomplicated diverticulitis.  There is noted fluid in the cervix and vaginal region, recommend nonemergent pelvic ultrasound.  Reassessment-patient was assessed, does not appear to be in acute distress at this time, vital signs reassuring, updated daughter on plan she is agreeable this plan and is ready for discharge.  Plan-pain likely due to diverticulitis, will start her on antibiotics, recommend bowel rest, follow-up PCP for further evaluation.  We will also have them follow-up with PCP for the small amount of fluid seen in the pelvic region.  Vital signs have remained stable, no indication for hospital admission.  Patient discussed with attending and they agreed with assessment and plan.  Patient given at home care as well strict return precautions.  Patient verbalized that they understood agreed to said plan.    Marcello Fennel, PA-C 03/29/21 2026    Truddie Hidden, MD 03/29/21 [phone]

## 2021-03-29 NOTE — ED Notes (Signed)
Pt was a difficult stick due to dementia and unwillingness to cooperate. A purewick is in place and an in and out cath has been refused.

## 2021-03-29 NOTE — ED Triage Notes (Signed)
Pt brought in from home by [facility] with c/o abdominal pain since yesterday. Pt has hx of stool impaction. Last BM yesterday. Last disimpacted a few days ago. Family reported to EMS that pt gets very tense and stiff when the pain comes on.

## 2021-03-29 NOTE — ED Provider Notes (Signed)
[facility] EMERGENCY DEPARTMENT Provider Note   CSN: [id number] Arrival date & time: 03/29/21  1341     History Chief Complaint  Patient presents with   Abdominal Pain    Heather Gilmore is a 87 y.o. female with a history of asthma, hyperlipidemia, hypertension Parkinson's disease and dementia who lives at home with 24/7 nursing care presenting for evaluation of abdominal pain which started yesterday.  Daughter at the bedside states she has a history of problems with constipation and stool impaction and over the past week she had to be disimpacted twice by her care provider.  Her last bowel movement was yesterday.  Her pain seems to wax and wane as the daughter states that she grimaces and tenses her body intermittently when she has exacerbation of this pain.  She has had no nausea or vomiting, no fevers or chills, denies dysuria, she is typically continent of urine.  Of note, she has had a significant weight loss in the past year as she states that "nothing tastes good".  She has had no treatments prior to arrival for this pain.  Daughter states she has had more agitation than her baseline and intermittently less cooperative with her care.  She has been picking imaginary "floaters" out of the air.  Daughter states she has had this symptom in the past but has returned since yesterday.  The history is provided by the patient and a relative (daughter at bedside).      Past Medical History:  Diagnosis Date   Arthritis    Asthma    Dysphagia    Essential tremor    Gait abnormality 10/17/2017   Hallucinations 12/26/2018   Heart murmur    as child-had rhuematic fever   Hyperlipidemia    Hypertension    IBS (irritable bowel syndrome)    Memory difficulty 10/17/2017   Parkinson disease ([place])    Rosacea    Thyroid nodule     Patient Active Problem List   Diagnosis Date Noted   Chest pain 11/09/2020   Nausea 11/09/2020   Generalized abdominal tenderness 11/09/2020   Fatigue 11/09/2020    Dysphagia 06/03/2019   Unilateral primary osteoarthritis, right hip 03/21/2019   Hallucinations 12/26/2018   Gait abnormality 10/17/2017   Memory difficulty 10/17/2017   Urge incontinence 12/19/2013   Extrapyramidal dysarthria 06/04/2013   Parkinson's disease ([place]) 05/28/2013   Essential hypertension 06/09/2009   Asthma 06/09/2009   GERD 06/09/2009   SYNCOPE 06/09/2009   Abnormal involuntary movement 06/09/2009    Past Surgical History:  Procedure Laterality Date   APPENDECTOMY     CATARACT EXTRACTION W/PHACO  05/03/2011   Procedure: CATARACT EXTRACTION PHACO AND INTRAOCULAR LENS PLACEMENT ([place]);  Surgeon: Elta Guadeloupe T. Gershon Crane;  Location: [facility];  Service: Ophthalmology;  Laterality: Right;  CDE: 7.52   CATARACT EXTRACTION W/PHACO  05/17/2011   Procedure: CATARACT EXTRACTION PHACO AND INTRAOCULAR LENS PLACEMENT (IOC);  Surgeon: Elta Guadeloupe T. Gershon Crane;  Location: [facility];  Service: Ophthalmology;  Laterality: Left;  CDE 9.49   CHOLECYSTECTOMY  6 yrs ago   aph-Dr Arnoldo Morale   ESOPHAGEAL DILATION N/A 03/02/2016   Procedure: ESOPHAGEAL DILATION;  Surgeon: Rogene Houston, MD;  Location: [facility];  Service: Endoscopy;  Laterality: N/A;   ESOPHAGOGASTRODUODENOSCOPY N/A 03/02/2016   Procedure: ESOPHAGOGASTRODUODENOSCOPY (EGD);  Surgeon: Rogene Houston, MD;  [facility];  Service: Endoscopy;  Laterality: N/A;  1:25   TONSILLECTOMY     as child     OB History  No obstetric history on file.     Family History  Problem Relation Age of Onset   Anesthesia problems Neg Hx    Hypotension Neg Hx    Malignant hyperthermia Neg Hx    Pseudochol deficiency Neg Hx     Social History   Tobacco Use   Smoking status: Former    Packs/day: 1.00    Years: 10.00    Pack years: 10.00    Types: Cigarettes    Quit date: 04/28/1991    Years since quitting: 29.9   Smokeless tobacco: Never  Vaping Use   Vaping Use: Never used  Substance Use Topics   Alcohol use: No   Drug use: No     Home Medications Prior to Admission medications   Medication Sig Start Date End Date Taking? Authorizing Provider  acetaminophen (TYLENOL) 500 MG tablet Take 500 mg by mouth every 6 (six) hours as needed. For pain    [provider]  albuterol (PROVENTIL HFA;VENTOLIN HFA) 108 (90 Base) MCG/ACT inhaler Please dispense two if possible. For shortness of breath 08/28/18   Mikey Kirschner, MD  azelastine (ASTELIN) 0.1 % nasal spray PLACE 2 SPRAYS IN BOTH NOSTRILS 2 TIMES DAILY. Patient taking differently: Place 2 sprays into both nostrils 2 (two) times daily. 11/05/20   Erven Colla, DO  buPROPion (WELLBUTRIN) 75 MG tablet Take 1 tablet (75 mg total) by mouth daily after breakfast. 10/07/20   Kathrynn Ducking, MD  carbidopa-levodopa (PARCOPA) 25-100 MG disintegrating tablet Take 1 tablet by mouth 4 (four) times daily. 03/11/21   Kathrynn Ducking, MD  cetirizine (ZYRTEC) 10 MG tablet Take 10 mg by mouth daily.    [provider]  HYDROcodone-acetaminophen (NORCO/VICODIN) 5-325 MG tablet Take 1 tablet by mouth every 4 (four) hours as needed. 02/25/21   [provider]  mirtazapine (REMERON) 7.5 MG tablet Take 1 tablet (7.5 mg total) by mouth at bedtime. 11/19/20   Kathrynn Ducking, MD  multivitamin-lutein [facility]) CAPS capsule Take 1 capsule by mouth daily.    [provider]    Allergies    Dyazide [hydrochlorothiazide w-triamterene] and Penicillins  Review of Systems   Review of Systems  Unable to perform ROS: Dementia  Constitutional:  Positive for appetite change. Negative for fever.  Gastrointestinal:  Positive for abdominal pain and constipation. Negative for diarrhea and vomiting.  Genitourinary:  Negative for dysuria.   Physical Exam Updated Vital Signs BP (!) 163/74   Pulse 73   Temp 98.1 F (36.7 C) (Oral)   Resp (!) 21   Ht 5' (1.524 m)   Wt 34.1 kg   SpO2 100%   BMI 14.69 kg/m   Physical Exam Vitals and nursing note  reviewed. Exam conducted with a chaperone present.  Constitutional:      Appearance: She is well-developed.  HENT:     Head: Normocephalic and atraumatic.  Eyes:     Conjunctiva/sclera: Conjunctivae normal.  Cardiovascular:     Rate and Rhythm: Normal rate and regular rhythm.     Heart sounds: Normal heart sounds.  Pulmonary:     Effort: Pulmonary effort is normal.     Breath sounds: Normal breath sounds. No wheezing.  Abdominal:     General: Abdomen is flat. Bowel sounds are normal.     Palpations: Abdomen is soft.     Tenderness: There is abdominal tenderness in the left lower quadrant. There is no guarding.     Comments: No guarding but pt  grimaces to palpation LLQ.   Genitourinary:    Rectum: Guaiac result negative. No mass.     Comments: No fecal impaction. Musculoskeletal:        General: Normal range of motion.     Cervical back: Normal range of motion.  Skin:    General: Skin is warm and dry.  Neurological:     Mental Status: She is alert.    ED Results / Procedures / Treatments   Labs (all labs ordered are listed, but only abnormal results are displayed) Labs Reviewed  CBC WITH DIFFERENTIAL/PLATELET - Abnormal; Notable for the following components:      Result Value   RBC 3.46 (*)    Hemoglobin 10.6 (*)    HCT 31.8 (*)    All other components within normal limits  COMPREHENSIVE METABOLIC PANEL - Abnormal; Notable for the following components:   Potassium 3.0 (*)    Calcium 8.5 (*)    Total Protein 5.9 (*)    Albumin 3.0 (*)    AST 7 (*)    All other components within normal limits  URINALYSIS, ROUTINE W REFLEX MICROSCOPIC  POC OCCULT BLOOD, ED    EKG None  Radiology DG Abdomen 1 View  Result Date: 03/29/2021 CLINICAL DATA:  Constipation, history of stool impaction EXAM: ABDOMEN - 1 VIEW COMPARISON:  CT abdomen/pelvis 03/17/2017 FINDINGS: There is no evidence of mechanical bowel obstruction. No significant stool burden is identified. There is no gross  organomegaly or abnormal soft tissue calcification. Cholecystectomy clips are noted. There is multilevel degenerative change of the lumbar spine and hips. The imaged lung bases are clear. IMPRESSION: No evidence of mechanical obstruction or abnormally large stool burden. Electronically Signed   By: Valetta Mole M.D.   On: 03/29/2021 16:50    Procedures Procedures   Medications Ordered in ED Medications  sodium chloride 0.9 % bolus 500 mL (0 mLs Intravenous Stopped 03/29/21 1815)  LORazepam (ATIVAN) injection 0.25 mg (0.25 mg Intravenous Given 03/29/21 1844)    ED Course  I have reviewed the triage vital signs and the nursing notes.  Pertinent labs & imaging results that were available during my care of the patient were reviewed by me and considered in my medical decision making (see chart for details).    MDM Rules/Calculators/A&P                           Patient with left lower quadrant pain of unclear etiology.  She is tender to palpation on exam.  CT imaging is pending, UA also pending.  Discussed with Will Ileene Patrick PA-C who assumes care. Final Clinical Impression(s) / ED Diagnoses Final diagnoses:  None    Rx / DC Orders ED Discharge Orders     None        Landis Martins 03/29/21 1926    Truddie Hidden, MD 03/29/21 [phone]

## 2021-05-08 DEATH — deceased

## 2021-05-11 ENCOUNTER — Encounter (HOSPITAL_COMMUNITY): Payer: Self-pay | Admitting: Radiology

## 2021-07-14 ENCOUNTER — Ambulatory Visit: Payer: PPO | Admitting: Neurology
